# Patient Record
Sex: Female | Born: 1945 | Race: White | Hispanic: No | Marital: Married | Smoking: Never smoker
Health system: Southern US, Community
[De-identification: ages and names within clinical notes are randomized; demographics above are authoritative.]

## PROBLEM LIST (undated history)

## (undated) DIAGNOSIS — H269 Unspecified cataract: Secondary | ICD-10-CM

## (undated) DIAGNOSIS — Z8719 Personal history of other diseases of the digestive system: Secondary | ICD-10-CM

## (undated) DIAGNOSIS — I10 Essential (primary) hypertension: Secondary | ICD-10-CM

## (undated) DIAGNOSIS — D649 Anemia, unspecified: Secondary | ICD-10-CM

## (undated) DIAGNOSIS — M1990 Unspecified osteoarthritis, unspecified site: Secondary | ICD-10-CM

## (undated) DIAGNOSIS — Z87442 Personal history of urinary calculi: Secondary | ICD-10-CM

## (undated) DIAGNOSIS — G43909 Migraine, unspecified, not intractable, without status migrainosus: Secondary | ICD-10-CM

## (undated) DIAGNOSIS — Z5189 Encounter for other specified aftercare: Secondary | ICD-10-CM

## (undated) DIAGNOSIS — G4700 Insomnia, unspecified: Secondary | ICD-10-CM

## (undated) DIAGNOSIS — L821 Other seborrheic keratosis: Secondary | ICD-10-CM

## (undated) DIAGNOSIS — F419 Anxiety disorder, unspecified: Secondary | ICD-10-CM

## (undated) DIAGNOSIS — E785 Hyperlipidemia, unspecified: Secondary | ICD-10-CM

## (undated) DIAGNOSIS — R9431 Abnormal electrocardiogram [ECG] [EKG]: Secondary | ICD-10-CM

## (undated) DIAGNOSIS — Z923 Personal history of irradiation: Secondary | ICD-10-CM

## (undated) DIAGNOSIS — F32A Depression, unspecified: Secondary | ICD-10-CM

## (undated) DIAGNOSIS — F329 Major depressive disorder, single episode, unspecified: Secondary | ICD-10-CM

## (undated) DIAGNOSIS — R1013 Epigastric pain: Secondary | ICD-10-CM

## (undated) DIAGNOSIS — J309 Allergic rhinitis, unspecified: Secondary | ICD-10-CM

## (undated) DIAGNOSIS — E039 Hypothyroidism, unspecified: Secondary | ICD-10-CM

## (undated) DIAGNOSIS — K219 Gastro-esophageal reflux disease without esophagitis: Secondary | ICD-10-CM

## (undated) DIAGNOSIS — T7840XA Allergy, unspecified, initial encounter: Secondary | ICD-10-CM

## (undated) HISTORY — DX: Anemia, unspecified: D64.9

## (undated) HISTORY — DX: Allergic rhinitis, unspecified: J30.9

## (undated) HISTORY — DX: Migraine, unspecified, not intractable, without status migrainosus: G43.909

## (undated) HISTORY — DX: Anxiety disorder, unspecified: F41.9

## (undated) HISTORY — PX: JOINT REPLACEMENT: SHX530

## (undated) HISTORY — DX: Insomnia, unspecified: G47.00

## (undated) HISTORY — PX: OTHER SURGICAL HISTORY: SHX169

## (undated) HISTORY — DX: Unspecified cataract: H26.9

## (undated) HISTORY — DX: Allergy, unspecified, initial encounter: T78.40XA

## (undated) HISTORY — DX: Essential (primary) hypertension: I10

## (undated) HISTORY — DX: Unspecified osteoarthritis, unspecified site: M19.90

## (undated) HISTORY — DX: Major depressive disorder, single episode, unspecified: F32.9

## (undated) HISTORY — DX: Other seborrheic keratosis: L82.1

## (undated) HISTORY — DX: Hyperlipidemia, unspecified: E78.5

## (undated) HISTORY — DX: Epigastric pain: R10.13

## (undated) HISTORY — DX: Personal history of other diseases of the digestive system: Z87.19

## (undated) HISTORY — PX: EYE SURGERY: SHX253

## (undated) HISTORY — DX: Encounter for other specified aftercare: Z51.89

## (undated) HISTORY — DX: Depression, unspecified: F32.A

## (undated) HISTORY — DX: Personal history of irradiation: Z92.3

## (undated) HISTORY — DX: Abnormal electrocardiogram (ECG) (EKG): R94.31

---

## 1984-09-04 HISTORY — PX: ABDOMINAL HYSTERECTOMY: SHX81

## 1998-12-23 ENCOUNTER — Other Ambulatory Visit: Admission: RE | Admit: 1998-12-23 | Discharge: 1998-12-23 | Payer: Self-pay | Admitting: Obstetrics & Gynecology

## 2000-01-23 ENCOUNTER — Other Ambulatory Visit: Admission: RE | Admit: 2000-01-23 | Discharge: 2000-01-23 | Payer: Self-pay | Admitting: Obstetrics & Gynecology

## 2001-02-19 ENCOUNTER — Other Ambulatory Visit: Admission: RE | Admit: 2001-02-19 | Discharge: 2001-02-19 | Payer: Self-pay | Admitting: Obstetrics & Gynecology

## 2002-03-10 ENCOUNTER — Other Ambulatory Visit: Admission: RE | Admit: 2002-03-10 | Discharge: 2002-03-10 | Payer: Self-pay | Admitting: Obstetrics & Gynecology

## 2006-02-23 ENCOUNTER — Encounter: Payer: Self-pay | Admitting: Vascular Surgery

## 2006-02-23 ENCOUNTER — Ambulatory Visit (HOSPITAL_COMMUNITY): Admission: RE | Admit: 2006-02-23 | Discharge: 2006-02-23 | Payer: Self-pay | Admitting: Internal Medicine

## 2007-01-31 ENCOUNTER — Encounter: Admission: RE | Admit: 2007-01-31 | Discharge: 2007-01-31 | Payer: Self-pay | Admitting: Internal Medicine

## 2008-03-25 ENCOUNTER — Encounter: Admission: RE | Admit: 2008-03-25 | Discharge: 2008-03-25 | Payer: Self-pay | Admitting: Internal Medicine

## 2009-06-01 ENCOUNTER — Ambulatory Visit (HOSPITAL_COMMUNITY): Admission: RE | Admit: 2009-06-01 | Discharge: 2009-06-01 | Payer: Self-pay | Admitting: Internal Medicine

## 2009-06-10 ENCOUNTER — Ambulatory Visit (HOSPITAL_COMMUNITY): Admission: RE | Admit: 2009-06-10 | Discharge: 2009-06-10 | Payer: Self-pay | Admitting: Internal Medicine

## 2009-12-15 ENCOUNTER — Ambulatory Visit (HOSPITAL_COMMUNITY): Admission: RE | Admit: 2009-12-15 | Discharge: 2009-12-15 | Payer: Self-pay | Admitting: Internal Medicine

## 2010-06-29 ENCOUNTER — Ambulatory Visit (HOSPITAL_COMMUNITY): Admission: RE | Admit: 2010-06-29 | Discharge: 2010-06-29 | Payer: Self-pay | Admitting: Internal Medicine

## 2010-09-02 ENCOUNTER — Encounter
Admission: RE | Admit: 2010-09-02 | Discharge: 2010-09-02 | Payer: Self-pay | Source: Home / Self Care | Attending: Internal Medicine | Admitting: Internal Medicine

## 2010-09-15 ENCOUNTER — Encounter (HOSPITAL_COMMUNITY)
Admission: RE | Admit: 2010-09-15 | Discharge: 2010-10-04 | Payer: Self-pay | Source: Home / Self Care | Attending: Internal Medicine | Admitting: Internal Medicine

## 2010-09-23 DIAGNOSIS — Z923 Personal history of irradiation: Secondary | ICD-10-CM

## 2010-09-23 HISTORY — DX: Personal history of irradiation: Z92.3

## 2011-06-01 ENCOUNTER — Other Ambulatory Visit (HOSPITAL_COMMUNITY): Payer: Self-pay | Admitting: Internal Medicine

## 2011-06-01 DIAGNOSIS — R920 Mammographic microcalcification found on diagnostic imaging of breast: Secondary | ICD-10-CM

## 2011-07-05 ENCOUNTER — Ambulatory Visit (HOSPITAL_COMMUNITY)
Admission: RE | Admit: 2011-07-05 | Discharge: 2011-07-05 | Disposition: A | Discharge status: Home / Self Care | Payer: 59 | Source: Ambulatory Visit | Attending: Internal Medicine | Admitting: Internal Medicine

## 2011-07-05 DIAGNOSIS — R928 Other abnormal and inconclusive findings on diagnostic imaging of breast: Secondary | ICD-10-CM | POA: Insufficient documentation

## 2011-07-05 DIAGNOSIS — Z09 Encounter for follow-up examination after completed treatment for conditions other than malignant neoplasm: Secondary | ICD-10-CM | POA: Insufficient documentation

## 2011-07-05 DIAGNOSIS — R920 Mammographic microcalcification found on diagnostic imaging of breast: Secondary | ICD-10-CM

## 2012-12-05 ENCOUNTER — Other Ambulatory Visit: Payer: Self-pay | Admitting: Internal Medicine

## 2012-12-05 DIAGNOSIS — R1011 Right upper quadrant pain: Secondary | ICD-10-CM

## 2012-12-06 ENCOUNTER — Ambulatory Visit
Admission: RE | Admit: 2012-12-06 | Discharge: 2012-12-06 | Disposition: A | Discharge status: Home / Self Care | Payer: Medicare Other | Source: Ambulatory Visit | Attending: Internal Medicine | Admitting: Internal Medicine

## 2012-12-06 ENCOUNTER — Other Ambulatory Visit: Payer: Self-pay | Admitting: Internal Medicine

## 2012-12-06 DIAGNOSIS — R1011 Right upper quadrant pain: Secondary | ICD-10-CM

## 2013-03-05 ENCOUNTER — Other Ambulatory Visit: Payer: Self-pay | Admitting: Gastroenterology

## 2013-03-05 DIAGNOSIS — R1310 Dysphagia, unspecified: Secondary | ICD-10-CM

## 2013-03-11 ENCOUNTER — Ambulatory Visit
Admission: RE | Admit: 2013-03-11 | Discharge: 2013-03-11 | Disposition: A | Discharge status: Home / Self Care | Payer: Medicare Other | Source: Ambulatory Visit | Attending: Gastroenterology | Admitting: Gastroenterology

## 2013-03-11 DIAGNOSIS — R1310 Dysphagia, unspecified: Secondary | ICD-10-CM

## 2013-06-02 ENCOUNTER — Other Ambulatory Visit (HOSPITAL_COMMUNITY): Payer: Self-pay | Admitting: Internal Medicine

## 2013-06-02 DIAGNOSIS — Z139 Encounter for screening, unspecified: Secondary | ICD-10-CM

## 2013-06-10 ENCOUNTER — Ambulatory Visit (HOSPITAL_COMMUNITY)
Admission: RE | Admit: 2013-06-10 | Discharge: 2013-06-10 | Disposition: A | Discharge status: Home / Self Care | Payer: Medicare Other | Source: Ambulatory Visit | Attending: Internal Medicine | Admitting: Internal Medicine

## 2013-06-10 DIAGNOSIS — Z1231 Encounter for screening mammogram for malignant neoplasm of breast: Secondary | ICD-10-CM | POA: Insufficient documentation

## 2013-06-10 DIAGNOSIS — Z139 Encounter for screening, unspecified: Secondary | ICD-10-CM

## 2014-05-07 ENCOUNTER — Other Ambulatory Visit (HOSPITAL_COMMUNITY): Payer: Self-pay | Admitting: Internal Medicine

## 2014-05-07 DIAGNOSIS — Z139 Encounter for screening, unspecified: Secondary | ICD-10-CM

## 2014-05-19 ENCOUNTER — Other Ambulatory Visit: Payer: Self-pay

## 2014-05-19 DIAGNOSIS — Z1231 Encounter for screening mammogram for malignant neoplasm of breast: Secondary | ICD-10-CM

## 2014-06-11 ENCOUNTER — Ambulatory Visit (HOSPITAL_COMMUNITY): Payer: Medicare Other

## 2014-06-22 ENCOUNTER — Ambulatory Visit
Admission: RE | Admit: 2014-06-22 | Discharge: 2014-06-22 | Disposition: A | Discharge status: Home / Self Care | Payer: Medicare Other | Source: Ambulatory Visit

## 2014-06-22 ENCOUNTER — Encounter (INDEPENDENT_AMBULATORY_CARE_PROVIDER_SITE_OTHER): Payer: Self-pay

## 2014-06-22 DIAGNOSIS — Z1231 Encounter for screening mammogram for malignant neoplasm of breast: Secondary | ICD-10-CM

## 2017-02-08 ENCOUNTER — Other Ambulatory Visit: Payer: Self-pay | Admitting: Internal Medicine

## 2017-02-08 DIAGNOSIS — Z1231 Encounter for screening mammogram for malignant neoplasm of breast: Secondary | ICD-10-CM

## 2017-02-22 ENCOUNTER — Ambulatory Visit
Admission: RE | Admit: 2017-02-22 | Discharge: 2017-02-22 | Disposition: A | Discharge status: Home / Self Care | Payer: Medicare Other | Source: Ambulatory Visit | Attending: Internal Medicine | Admitting: Internal Medicine

## 2017-02-22 DIAGNOSIS — Z1231 Encounter for screening mammogram for malignant neoplasm of breast: Secondary | ICD-10-CM

## 2017-06-21 ENCOUNTER — Other Ambulatory Visit (HOSPITAL_COMMUNITY): Payer: Self-pay | Admitting: Internal Medicine

## 2017-06-21 ENCOUNTER — Other Ambulatory Visit: Payer: Self-pay | Admitting: Internal Medicine

## 2017-06-21 DIAGNOSIS — E0590 Thyrotoxicosis, unspecified without thyrotoxic crisis or storm: Secondary | ICD-10-CM

## 2017-07-02 ENCOUNTER — Ambulatory Visit
Admission: RE | Admit: 2017-07-02 | Discharge: 2017-07-02 | Disposition: A | Discharge status: Home / Self Care | Payer: Medicare Other | Source: Ambulatory Visit | Attending: Internal Medicine | Admitting: Internal Medicine

## 2017-07-02 DIAGNOSIS — E0590 Thyrotoxicosis, unspecified without thyrotoxic crisis or storm: Secondary | ICD-10-CM

## 2017-07-10 ENCOUNTER — Encounter (HOSPITAL_COMMUNITY): Payer: Self-pay

## 2017-07-10 ENCOUNTER — Ambulatory Visit (HOSPITAL_COMMUNITY): Payer: Medicare Other

## 2017-07-11 ENCOUNTER — Other Ambulatory Visit (HOSPITAL_COMMUNITY): Payer: Medicare Other

## 2017-08-14 ENCOUNTER — Encounter (HOSPITAL_COMMUNITY)
Admission: RE | Admit: 2017-08-14 | Discharge: 2017-08-14 | Disposition: A | Discharge status: Home / Self Care | Payer: Medicare Other | Source: Ambulatory Visit | Attending: Internal Medicine | Admitting: Internal Medicine

## 2017-08-14 ENCOUNTER — Encounter (HOSPITAL_COMMUNITY): Payer: Self-pay

## 2017-08-14 DIAGNOSIS — E0590 Thyrotoxicosis, unspecified without thyrotoxic crisis or storm: Secondary | ICD-10-CM

## 2017-08-15 ENCOUNTER — Other Ambulatory Visit (HOSPITAL_COMMUNITY): Payer: Self-pay | Admitting: Internal Medicine

## 2017-08-15 ENCOUNTER — Encounter (HOSPITAL_COMMUNITY): Payer: Medicare Other

## 2017-08-15 DIAGNOSIS — E0590 Thyrotoxicosis, unspecified without thyrotoxic crisis or storm: Secondary | ICD-10-CM

## 2017-09-26 ENCOUNTER — Encounter (HOSPITAL_COMMUNITY)
Admission: RE | Admit: 2017-09-26 | Discharge: 2017-09-26 | Disposition: A | Discharge status: Home / Self Care | Payer: Medicare Other | Source: Ambulatory Visit | Attending: Internal Medicine | Admitting: Internal Medicine

## 2017-09-26 ENCOUNTER — Encounter (HOSPITAL_COMMUNITY): Payer: Self-pay

## 2017-09-26 DIAGNOSIS — E0590 Thyrotoxicosis, unspecified without thyrotoxic crisis or storm: Secondary | ICD-10-CM | POA: Insufficient documentation

## 2017-09-26 MED ORDER — SODIUM IODIDE I-123 7.4 MBQ CAPS
300.0000 | ORAL_CAPSULE | Freq: Once | ORAL | Status: AC
  Administered 2017-09-26: 298 via ORAL

## 2017-09-27 ENCOUNTER — Encounter (HOSPITAL_COMMUNITY)
Admission: RE | Admit: 2017-09-27 | Discharge: 2017-09-27 | Disposition: A | Discharge status: Home / Self Care | Payer: Medicare Other | Source: Ambulatory Visit | Attending: Internal Medicine | Admitting: Internal Medicine

## 2017-11-29 ENCOUNTER — Other Ambulatory Visit: Payer: Self-pay

## 2017-11-29 ENCOUNTER — Emergency Department (HOSPITAL_COMMUNITY): Payer: Medicare Other

## 2017-11-29 ENCOUNTER — Encounter (HOSPITAL_COMMUNITY): Payer: Self-pay

## 2017-11-29 ENCOUNTER — Emergency Department (HOSPITAL_COMMUNITY)
Admission: EM | Admit: 2017-11-29 | Discharge: 2017-11-29 | Disposition: A | Discharge status: Home / Self Care | Payer: Medicare Other | Attending: Emergency Medicine | Admitting: Emergency Medicine

## 2017-11-29 DIAGNOSIS — I10 Essential (primary) hypertension: Secondary | ICD-10-CM | POA: Insufficient documentation

## 2017-11-29 DIAGNOSIS — F329 Major depressive disorder, single episode, unspecified: Secondary | ICD-10-CM | POA: Insufficient documentation

## 2017-11-29 DIAGNOSIS — J9801 Acute bronchospasm: Secondary | ICD-10-CM | POA: Diagnosis not present

## 2017-11-29 DIAGNOSIS — Z79899 Other long term (current) drug therapy: Secondary | ICD-10-CM | POA: Diagnosis not present

## 2017-11-29 DIAGNOSIS — K219 Gastro-esophageal reflux disease without esophagitis: Secondary | ICD-10-CM | POA: Diagnosis not present

## 2017-11-29 DIAGNOSIS — R0989 Other specified symptoms and signs involving the circulatory and respiratory systems: Secondary | ICD-10-CM | POA: Diagnosis present

## 2017-11-29 LAB — CBC WITH DIFFERENTIAL/PLATELET
BASOS PCT: 1 %
Basophils Absolute: 0.1 10*3/uL (ref 0.0–0.1)
EOS ABS: 0.3 10*3/uL (ref 0.0–0.7)
EOS PCT: 3 %
HCT: 38 % (ref 36.0–46.0)
Hemoglobin: 12.1 g/dL (ref 12.0–15.0)
Lymphocytes Relative: 35 %
Lymphs Abs: 3.4 10*3/uL (ref 0.7–4.0)
MCH: 28.9 pg (ref 26.0–34.0)
MCHC: 31.8 g/dL (ref 30.0–36.0)
MCV: 90.7 fL (ref 78.0–100.0)
Monocytes Absolute: 0.6 10*3/uL (ref 0.1–1.0)
Monocytes Relative: 6 %
Neutro Abs: 5.6 10*3/uL (ref 1.7–7.7)
Neutrophils Relative %: 55 %
PLATELETS: 318 10*3/uL (ref 150–400)
RBC: 4.19 MIL/uL (ref 3.87–5.11)
RDW: 15.5 % (ref 11.5–15.5)
WBC: 9.9 10*3/uL (ref 4.0–10.5)

## 2017-11-29 LAB — COMPREHENSIVE METABOLIC PANEL
ALK PHOS: 70 U/L (ref 38–126)
ALT: 19 U/L (ref 14–54)
AST: 23 U/L (ref 15–41)
Albumin: 3.9 g/dL (ref 3.5–5.0)
Anion gap: 12 (ref 5–15)
BILIRUBIN TOTAL: 0.5 mg/dL (ref 0.3–1.2)
BUN: 19 mg/dL (ref 6–20)
CALCIUM: 9.3 mg/dL (ref 8.9–10.3)
CO2: 23 mmol/L (ref 22–32)
CREATININE: 1.38 mg/dL — AB (ref 0.44–1.00)
Chloride: 105 mmol/L (ref 101–111)
GFR, EST AFRICAN AMERICAN: 43 mL/min — AB (ref >60)
GFR, EST NON AFRICAN AMERICAN: 37 mL/min — AB (ref >60)
Glucose, Bld: 118 mg/dL — ABNORMAL HIGH (ref 65–99)
Potassium: 3.6 mmol/L (ref 3.5–5.1)
Sodium: 140 mmol/L (ref 135–145)
Total Protein: 7.6 g/dL (ref 6.5–8.1)

## 2017-11-29 LAB — TROPONIN I

## 2017-11-29 MED ORDER — ALBUTEROL SULFATE (2.5 MG/3ML) 0.083% IN NEBU
5.0000 mg | INHALATION_SOLUTION | Freq: Once | RESPIRATORY_TRACT | Status: AC
  Administered 2017-11-29: 5 mg via RESPIRATORY_TRACT
  Filled 2017-11-29: qty 6

## 2017-11-29 MED ORDER — FAMOTIDINE 20 MG PO TABS
20.0000 mg | ORAL_TABLET | Freq: Once | ORAL | Status: AC
  Administered 2017-11-29: 20 mg via ORAL
  Filled 2017-11-29: qty 1

## 2017-11-29 MED ORDER — RACEPINEPHRINE HCL 2.25 % IN NEBU
0.3000 mL | INHALATION_SOLUTION | Freq: Once | RESPIRATORY_TRACT | Status: AC
  Administered 2017-11-29: 0.3 mL via RESPIRATORY_TRACT
  Filled 2017-11-29: qty 0.5

## 2017-11-29 MED ORDER — GI COCKTAIL ~~LOC~~
30.0000 mL | Freq: Once | ORAL | Status: AC
  Administered 2017-11-29: 30 mL via ORAL
  Filled 2017-11-29: qty 30

## 2017-11-29 MED ORDER — PANTOPRAZOLE SODIUM 20 MG PO TBEC: DELAYED_RELEASE_TABLET | ORAL | 0 refills | Status: DC

## 2017-11-29 MED ORDER — CALCIUM CARBONATE ANTACID 500 MG PO CHEW
2.0000 | CHEWABLE_TABLET | Freq: Once | ORAL | Status: AC
  Administered 2017-11-29: 400 mg via ORAL
  Filled 2017-11-29: qty 2

## 2017-11-29 NOTE — ED Triage Notes (Signed)
SOB 1 hour ago with sore throat.

## 2017-11-29 NOTE — ED Provider Notes (Signed)
[facility] Provider Note   CSN: [id number] Arrival date & time: 11/29/17  0212  Time seen 02:48 AM   History   Chief Complaint Chief Complaint  Patient presents with  . Shortness of Breath    HPI [facility] is a 72 y.o. female.  HPI patient states she has been having really bad acid reflux and sometimes at night she gets episodes where she feels like she is choking.  She states the last time was a few months ago until tonight.  About an hour and a half ago she was awakened from sleep and felt the need to cough and felt like she could not breathe.  She also was wheezing.  She complains of burning reflex fluid in her throat.  She has never discussed this with her doctor.  She does not have any inhalers or nebulizers at home.  She states she has been on Protonix for a long time.  She states tonight she did not pretzels, corn bread, and almond milk.  She states she has noticed that food she eats can make the symptoms worse.  She states she is noticed if she lays on her left side the symptoms will get worse or happen more often, and if she sits up she feels better.  However tonight sitting up has not helped.  Patient states she had a endoscopy done at [facility] about 3 years ago that was normal.  She also reports she had a thyroid ablation done about 5 years ago however most recently she has been hypothyroid.  She has recently got a scan of her thyroid.  She is followed by Dr.Kerr, endocrinology.   PCP Marden Noble, MD En[facility] Baas  Past Medical History:  Diagnosis Date  . Abnormal EKG   . Allergic rhinitis   . Depression   . DJD (degenerative joint disease)    cervical and Rt. knee, Rt. shoulder rotator cuff tendonitis  . Dyspepsia   . H/O esophageal reflux    w/hiatal hernia  . 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 Hypertension   . Insomnia    Occassional  . Migraine     . Seborrheic keratoses    Scattered seborrheic keratoses    There are no active problems to display for this patient.   History reviewed. No pertinent surgical history.   OB History   None      Home Medications    Prior to Admission medications   Medication Sig Start Date End Date Taking? Authorizing Provider  ALPRAZolam Prudy Feeler) 0.25 MG tablet Take 0.25 mg by mouth at bedtime as needed for anxiety.   Yes [provider]  hydrochlorothiazide (MICROZIDE) 12.5 MG capsule Take 12.5 mg by mouth daily.   Yes [provider]  levothyroxine (SYNTHROID, LEVOTHROID) 50 MCG tablet Take 50 mcg by mouth daily before breakfast.   Yes [provider]  losartan (COZAAR) 25 MG tablet Take 50 mg by mouth daily.   Yes [provider]  rosuvastatin (CRESTOR) 10 MG tablet Take 10 mg by mouth daily.   Yes [provider]  pantoprazole (PROTONIX) 20 MG tablet Take 1 po BID x 2 weeks then once a day 11/29/17   Devoria Albe, MD    Family History Family History  Problem Relation Age of Onset  . Breast cancer Neg Hx     Social History Social History   Tobacco Use  . Smoking status:  Never Smoker  . Smokeless tobacco: Never Used  Substance Use Topics  . Alcohol use: Not on file  . Drug use: Not on file  lives at home Lives with spouse   Allergies   Effexor [venlafaxine]; Penicillins; and Erythromycin   Review of Systems Review of Systems  All other systems reviewed and are negative.    Physical Exam Updated Vital Signs BP (!) 121/51   Pulse 85   Temp 97.8 F (36.6 C) (Oral)   Resp 14   Ht 5\' 3"  (1.6 m)   Wt 77.1 kg (170 lb)   SpO2 98%   BMI 30.11 kg/m   Physical Exam  Constitutional: She is oriented to person, place, and time. She appears well-developed and well-nourished.  Non-toxic appearance. She does not appear ill. No distress.  HENT:  Head: Normocephalic and atraumatic.  Right Ear: External ear normal.  Left Ear: External  ear normal.  Nose: Nose normal. No mucosal edema or rhinorrhea.  Mouth/Throat: Oropharynx is clear and moist and mucous membranes are normal. No dental abscesses or uvula swelling.  Eyes: Pupils are equal, round, and reactive to light. Conjunctivae and EOM are normal.  Neck: Normal range of motion and full passive range of motion without pain. Neck supple.  Cardiovascular: Normal rate, regular rhythm and normal heart sounds. Exam reveals no gallop and no friction rub.  No murmur heard. Pulmonary/Chest: Effort normal. No respiratory distress. She has decreased breath sounds. She has wheezes in the right middle field, the right lower field, the left middle field and the left lower field. She has no rhonchi. She has no rales. She exhibits no tenderness and no crepitus.  Abdominal: Soft. Normal appearance and bowel sounds are normal. She exhibits no distension. There is no tenderness. There is no rebound and no guarding.  Musculoskeletal: Normal range of motion. She exhibits no edema or tenderness.  Moves all extremities well.   Neurological: She is alert and oriented to person, place, and time. She has normal strength. No cranial nerve deficit.  Skin: Skin is warm, dry and intact. No rash noted. No erythema. No pallor.  Psychiatric: She has a normal mood and affect. Her speech is normal and behavior is normal. Her mood appears not anxious.  Nursing note and vitals reviewed.    ED Treatments / Results  Labs (all labs ordered are listed, but only abnormal results are displayed) Results for orders placed or performed during the [facility] encounter of 11/29/17  Comprehensive metabolic panel  Result Value Ref Range   Sodium 140 135 - 145 mmol/L   Potassium 3.6 3.5 - 5.1 mmol/L   Chloride 105 101 - 111 mmol/L   CO2 23 22 - 32 mmol/L   Glucose, Bld 118 (H) 65 - 99 mg/dL   BUN 19 6 - 20 mg/dL   Creatinine, Ser 0.981.38 (H) 0.44 - 1.00 mg/dL   Calcium 9.3 8.9 - 11.910.3 mg/dL   Total Protein 7.6 6.5 - 8.1  g/dL   Albumin 3.9 3.5 - 5.0 g/dL   AST 23 15 - 41 U/L   ALT 19 14 - 54 U/L   Alkaline Phosphatase 70 38 - 126 U/L   Total Bilirubin 0.5 0.3 - 1.2 mg/dL   GFR calc non Af Amer 37 (L) >60 mL/min   GFR calc Af Amer 43 (L) >60 mL/min   Anion gap 12 5 - 15  CBC with Differential  Result Value Ref Range   WBC 9.9 4.0 - 10.5 K/uL  RBC 4.19 3.87 - 5.11 MIL/uL   Hemoglobin 12.1 12.0 - 15.0 g/dL   HCT 16.1 09.6 - 04.5 %   MCV 90.7 78.0 - 100.0 fL   MCH 28.9 26.0 - 34.0 pg   MCHC 31.8 30.0 - 36.0 g/dL   RDW 40.9 81.1 - 91.4 %   Platelets 318 150 - 400 K/uL   Neutrophils Relative % 55 %   Neutro Abs 5.6 1.7 - 7.7 K/uL   Lymphocytes Relative 35 %   Lymphs Abs 3.4 0.7 - 4.0 K/uL   Monocytes Relative 6 %   Monocytes Absolute 0.6 0.1 - 1.0 K/uL   Eosinophils Relative 3 %   Eosinophils Absolute 0.3 0.0 - 0.7 K/uL   Basophils Relative 1 %   Basophils Absolute 0.1 0.0 - 0.1 K/uL  Troponin I  Result Value Ref Range   Troponin I <0.03 <0.03 ng/mL    Laboratory interpretation all normal except renal insuffiency  EKG EKG Interpretation  Date/Time:  Thursday November 29 2017 02:45:12 EDT Ventricular Rate:  74 PR Interval:    QRS Duration: 103 QT Interval:  378 QTC Calculation: 420 R Axis:   51 Text Interpretation:  Sinus rhythm Ventricular premature complex Low voltage, extremity and precordial leads Baseline wander No old tracing to compare Confirmed by Devoria Albe ([id number]) on 11/29/2017 3:21:24 AM   Radiology Dg Chest 2 View  Result Date: 11/29/2017 CLINICAL DATA:  72 year old female with shortness of breath. EXAM: CHEST - 2 VIEW COMPARISON:  Chest radiograph dated 08/25/2010 FINDINGS: The heart size and mediastinal contours are within normal limits. Both lungs are clear. The visualized skeletal structures are unremarkable. IMPRESSION: No active cardiopulmonary disease. Electronically Signed   By: Elgie Collard M.D.   On: 11/29/2017 02:48    Procedures Procedures (including critical  care time)  Medications Ordered in ED Medications  albuterol (PROVENTIL) (2.5 MG/3ML) 0.083% nebulizer solution 5 mg (5 mg Nebulization Given 11/29/17 0314)  gi cocktail (Maalox,Lidocaine,Donnatal) (30 mLs Oral Given 11/29/17 0311)  famotidine (PEPCID) tablet 20 mg (20 mg Oral Given 11/29/17 0311)  Racepinephrine HCl 2.25 % nebulizer solution 0.3 mL (0.3 mLs Nebulization Given 11/29/17 0519)  calcium carbonate (TUMS - dosed in mg elemental calcium) chewable tablet 400 mg of elemental calcium (400 mg of elemental calcium Oral Given 11/29/17 0547)     Initial Impression / Assessment and Plan / ED Course  I have reviewed the triage vital signs and the nursing notes.  Pertinent labs & imaging results that were available during my care of the patient were reviewed by me and considered in my medical decision making (see chart for details).     Patient was given GI cocktail and Pepcid for her complaints of acid reflux.  She was given an albuterol nebulizer for her wheezing.  Recheck at 4:30 AM she states she now only has the burning or acid feeling when she coughs.  She still feels short of breath.  She was ambulating back to her room from the bathroom and she did appear to get short of breath.  When I listen to her she has improved air movement but she still has diffuse rhonchi in her lower lungs.  I am going to try a racemic epinephrine treatment in case she is having some laryngospasm from the acid reflux.  Recheck at 6:50 AM patient is smiling and states she feels much improved.  Her lungs are now clear.  We discussed her medication, Protonix.  She states she has not been taking  it on a daily basis.  We discussed we could change it to something else or have her take the Protonix twice a day for 2 weeks and then once a day.  Which is what she would prefer to do.  She does not want to change it because she states it works if she takes it.  I will give her information on how to manage gastric reflux  disease and give her the number for [facility] if she is not improving.  Final Clinical Impressions(s) / ED Diagnoses   Final diagnoses:  Gastroesophageal reflux disease without esophagitis  Bronchospasm    ED Discharge Orders        Ordered    pantoprazole (PROTONIX) 20 MG tablet     11/29/17 1610     Plan discharge  Devoria Albe, MD, Concha Pyo, MD 11/29/17 [phone]

## 2017-11-29 NOTE — Discharge Instructions (Signed)
Increase her Protonix to twice a day for the next 2 weeks then once a day.  Look at the information about GERD.  If you are not improving please call [facility] to be reevaluated.  Please return to emergency department if you get short of breath and wheezing again.

## 2018-05-04 LAB — COLOGUARD: Cologuard: NEGATIVE

## 2018-05-27 ENCOUNTER — Encounter: Payer: Self-pay | Admitting: Family Medicine

## 2018-05-27 ENCOUNTER — Ambulatory Visit: Payer: Medicare Other | Admitting: Family Medicine

## 2018-05-27 VITALS — BP 170/80 | HR 73 | Temp 97.0°F | Ht 63.0 in | Wt 179.8 lb

## 2018-05-27 DIAGNOSIS — K219 Gastro-esophageal reflux disease without esophagitis: Secondary | ICD-10-CM | POA: Insufficient documentation

## 2018-05-27 DIAGNOSIS — Z1159 Encounter for screening for other viral diseases: Secondary | ICD-10-CM | POA: Diagnosis not present

## 2018-05-27 DIAGNOSIS — E785 Hyperlipidemia, unspecified: Secondary | ICD-10-CM | POA: Insufficient documentation

## 2018-05-27 DIAGNOSIS — I10 Essential (primary) hypertension: Secondary | ICD-10-CM | POA: Insufficient documentation

## 2018-05-27 DIAGNOSIS — E782 Mixed hyperlipidemia: Secondary | ICD-10-CM

## 2018-05-27 DIAGNOSIS — E039 Hypothyroidism, unspecified: Secondary | ICD-10-CM | POA: Insufficient documentation

## 2018-05-27 NOTE — Progress Notes (Signed)
BP (!) 170/80   Pulse 73   Temp (!) 97 F (36.1 C) (Oral)   Ht 5' 3"  (1.6 m)   Wt 179 lb 12.8 oz (81.6 kg)   BMI 31.85 kg/m    Subjective:    Patient ID: [facility], female    DOB: 1946/03/20, 72 y.o.   MRN: [id number]  HPI: [facility] is a 72 y.o. female presenting on 05/27/2018 for New Patient (Initial Visit) [facility] - Patient wants her Thyroid checked - patient states that she had two small nodules in 2018) and Establish Care   HPI Hypertension Patient is currently on hydrochlorothiazide and losartan, and their blood pressure today is 170/80 but she had been out of her medications, we will restart them. Patient denies any lightheadedness or dizziness. Patient denies headaches, blurred vision, chest pains, shortness of breath, or weakness. Denies any side effects from medication and is content with current medication.   Hypothyroidism recheck Patient is coming in for thyroid recheck today as well. They deny any issues with hair changes or heat or cold problems or diarrhea or constipation. They deny any chest pain or palpitations. They are currently on levothyroxine 88 micrograms, patient says she had a scan done recently that showed a couple of nodules, we are requesting those records and once we get them we will determine what needs to be done.  GERD Patient is currently on pantoprazole.  She denies any major symptoms or abdominal pain or belching or burping. She denies any blood in her stool or lightheadedness or dizziness.   Hyperlipidemia Patient is coming in for recheck of his hyperlipidemia. The patient is currently taking Crestor. They deny any issues with myalgias or history of liver damage from it. They deny any focal numbness or weakness or chest pain.   Relevant past medical, surgical, family and social history reviewed and updated as indicated. Interim medical history since our last visit reviewed. Allergies and medications reviewed and updated.  Review of  Systems  Constitutional: Negative for chills and fever.  HENT: Negative for congestion, ear discharge and ear pain.   Eyes: Negative for redness and visual disturbance.  Respiratory: Negative for chest tightness and shortness of breath.   Cardiovascular: Negative for chest pain and leg swelling.  Genitourinary: Negative for difficulty urinating and dysuria.  Musculoskeletal: Negative for back pain and gait problem.  Skin: Negative for rash.  Neurological: Negative for dizziness, weakness, light-headedness, numbness and headaches.  Psychiatric/Behavioral: Negative for agitation and behavioral problems.  All other systems reviewed and are negative.   Per HPI unless specifically indicated above  Social History   Socioeconomic History  . Marital status: Married    Spouse name: Not on file  . Number of children: Not on file  . Years of education: Not on file  . Highest education level: Not on file  Occupational History  . Not on file  Social Needs  . Financial resource strain: Not on file  . Food insecurity:    Worry: Not on file    Inability: Not on file  . Transportation needs:    Medical: Not on file    Non-medical: Not on file  Tobacco Use  . Smoking status: Never Smoker  . Smokeless tobacco: Never Used  Substance and Sexual Activity  . Alcohol use: Never    Frequency: Never  . Drug use: Never  . Sexual activity: Not on file  Lifestyle  . Physical activity:    Days per week: Not on  file    Minutes per session: Not on file  . Stress: Not on file  Relationships  . Social connections:    Talks on phone: Not on file    Gets together: Not on file    Attends religious service: Not on file    Active member of club or organization: Not on file    Attends meetings of clubs or organizations: Not on file    Relationship status: Not on file  . Intimate partner violence:    Fear of current or ex partner: Not on file    Emotionally abused: Not on file    Physically abused:  Not on file    Forced sexual activity: Not on file  Other Topics Concern  . Not on file  Social History Narrative  . Not on file    Past Surgical History:  Procedure Laterality Date  . ABDOMINAL HYSTERECTOMY  1986    Family History  Problem Relation Age of Onset  . Cancer Father        lung  . Arthritis Sister   . Breast cancer Neg Hx     Allergies as of 05/27/2018      Reactions   Effexor [venlafaxine]    Dizziness   Penicillins    Erythromycin Rash      Medication List        Accurate as of 05/27/18 10:11 AM. Always use your most recent med list.          ALPRAZolam 0.25 MG tablet Commonly known as:  XANAX Take 0.25 mg by mouth at bedtime as needed for anxiety.   hydrochlorothiazide 12.5 MG capsule Commonly known as:  MICROZIDE Take 12.5 mg by mouth daily.   levothyroxine 50 MCG tablet Commonly known as:  SYNTHROID, LEVOTHROID Take 88 mcg by mouth daily before breakfast.   losartan 25 MG tablet Commonly known as:  COZAAR Take 50 mg by mouth daily.   pantoprazole 20 MG tablet Commonly known as:  PROTONIX Take 1 po BID x 2 weeks then once a day   rosuvastatin 10 MG tablet Commonly known as:  CRESTOR Take 10 mg by mouth daily.          Objective:    BP (!) 170/80   Pulse 73   Temp (!) 97 F (36.1 C) (Oral)   Ht 5' 3"  (1.6 m)   Wt 179 lb 12.8 oz (81.6 kg)   BMI 31.85 kg/m   Wt Readings from Last 3 Encounters:  05/27/18 179 lb 12.8 oz (81.6 kg)  11/29/17 170 lb (77.1 kg)    Physical Exam  Constitutional: She is oriented to person, place, and time. She appears well-developed and well-nourished. No distress.  Eyes: Pupils are equal, round, and reactive to light. Conjunctivae and EOM are normal.  Neck: Neck supple. No thyromegaly present.  Cardiovascular: Normal rate, regular rhythm, normal heart sounds and intact distal pulses.  No murmur heard. Pulmonary/Chest: Effort normal and breath sounds normal. No respiratory distress. She has no  wheezes.  Musculoskeletal: Normal range of motion. She exhibits no edema or tenderness.  Lymphadenopathy:    She has no cervical adenopathy.  Neurological: She is alert and oriented to person, place, and time. Coordination normal.  Skin: Skin is warm and dry. No rash noted. She is not diaphoretic.  Psychiatric: She has a normal mood and affect. Her behavior is normal.  Nursing note and vitals reviewed.       Assessment & Plan:   Problem List  Items Addressed This Visit      Cardiovascular and Mediastinum   Hypertension - Primary   Relevant Orders   CMP14+EGFR (Completed)     Digestive   GERD (gastroesophageal reflux disease)   Relevant Orders   CBC with Differential/Platelet (Completed)     Endocrine   Hypothyroidism   Relevant Orders   TSH (Completed)     Other   Hyperlipidemia    Other Visit Diagnoses    Need for hepatitis C screening test       Relevant Orders   Hepatitis C antibody (Completed)      Continue medications from previous, will check labs and then follow-up on thyroid nodule in the future.  Thyroid nodules not palpable Follow up plan: Return if symptoms worsen or fail to improve, for 2 weeks.  Caryl Pina, MD [facility] 05/27/2018, 10:11 AM

## 2018-05-28 LAB — CMP14+EGFR
A/G RATIO: 1.8 (ref 1.2–2.2)
ALK PHOS: 86 IU/L (ref 39–117)
ALT: 10 IU/L (ref 0–32)
AST: 14 IU/L (ref 0–40)
Albumin: 4.5 g/dL (ref 3.5–4.8)
BUN/Creatinine Ratio: 15 (ref 12–28)
BUN: 19 mg/dL (ref 8–27)
Bilirubin Total: 0.3 mg/dL (ref 0.0–1.2)
CHLORIDE: 105 mmol/L (ref 96–106)
CO2: 23 mmol/L (ref 20–29)
Calcium: 9.8 mg/dL (ref 8.7–10.3)
Creatinine, Ser: 1.23 mg/dL — ABNORMAL HIGH (ref 0.57–1.00)
GFR calc non Af Amer: 44 mL/min/{1.73_m2} — ABNORMAL LOW (ref >59)
GFR, EST AFRICAN AMERICAN: 51 mL/min/{1.73_m2} — AB (ref >59)
Globulin, Total: 2.5 g/dL (ref 1.5–4.5)
Glucose: 94 mg/dL (ref 65–99)
Potassium: 4.4 mmol/L (ref 3.5–5.2)
Sodium: 143 mmol/L (ref 134–144)
Total Protein: 7 g/dL (ref 6.0–8.5)

## 2018-05-28 LAB — CBC WITH DIFFERENTIAL/PLATELET
BASOS: 0 %
Basophils Absolute: 0 10*3/uL (ref 0.0–0.2)
EOS (ABSOLUTE): 0.2 10*3/uL (ref 0.0–0.4)
Eos: 2 %
Hematocrit: 39.4 % (ref 34.0–46.6)
Hemoglobin: 12.9 g/dL (ref 11.1–15.9)
Immature Grans (Abs): 0 10*3/uL (ref 0.0–0.1)
Immature Granulocytes: 0 %
Lymphocytes Absolute: 2.8 10*3/uL (ref 0.7–3.1)
Lymphs: 30 %
MCH: 27.4 pg (ref 26.6–33.0)
MCHC: 32.7 g/dL (ref 31.5–35.7)
MCV: 84 fL (ref 79–97)
MONOS ABS: 0.6 10*3/uL (ref 0.1–0.9)
Monocytes: 7 %
NEUTROS ABS: 5.8 10*3/uL (ref 1.4–7.0)
Neutrophils: 61 %
PLATELETS: 327 10*3/uL (ref 150–450)
RBC: 4.7 x10E6/uL (ref 3.77–5.28)
RDW: 14.9 % (ref 12.3–15.4)
WBC: 9.5 10*3/uL (ref 3.4–10.8)

## 2018-05-28 LAB — TSH: TSH: 0.937 u[IU]/mL (ref 0.450–4.500)

## 2018-05-28 LAB — HEPATITIS C ANTIBODY: Hep C Virus Ab: <0.1 s/co ratio (ref 0.0–0.9)

## 2018-05-29 ENCOUNTER — Telehealth: Payer: Self-pay | Admitting: Family Medicine

## 2018-05-29 NOTE — Telephone Encounter (Signed)
Patient aware of labs.  

## 2018-06-13 ENCOUNTER — Ambulatory Visit: Payer: Medicare Other | Admitting: Family Medicine

## 2018-06-26 ENCOUNTER — Encounter: Payer: Self-pay | Admitting: Family Medicine

## 2018-06-26 ENCOUNTER — Ambulatory Visit: Payer: Medicare Other | Admitting: Family Medicine

## 2018-06-26 ENCOUNTER — Encounter (INDEPENDENT_AMBULATORY_CARE_PROVIDER_SITE_OTHER): Payer: Self-pay

## 2018-06-26 VITALS — BP 135/81 | HR 85 | Temp 97.2°F | Ht 63.0 in | Wt 179.2 lb

## 2018-06-26 DIAGNOSIS — Z23 Encounter for immunization: Secondary | ICD-10-CM

## 2018-06-26 DIAGNOSIS — I10 Essential (primary) hypertension: Secondary | ICD-10-CM | POA: Diagnosis not present

## 2018-06-26 DIAGNOSIS — B351 Tinea unguium: Secondary | ICD-10-CM

## 2018-06-26 DIAGNOSIS — M1712 Unilateral primary osteoarthritis, left knee: Secondary | ICD-10-CM

## 2018-06-26 DIAGNOSIS — F411 Generalized anxiety disorder: Secondary | ICD-10-CM | POA: Diagnosis not present

## 2018-06-26 DIAGNOSIS — E039 Hypothyroidism, unspecified: Secondary | ICD-10-CM

## 2018-06-26 MED ORDER — LEVOTHYROXINE SODIUM 88 MCG PO TABS: 88.0000 ug | ORAL_TABLET | Freq: Every day | ORAL | 3 refills | Status: DC

## 2018-06-26 MED ORDER — ALPRAZOLAM 0.25 MG PO TABS: 0.2500 mg | ORAL_TABLET | Freq: Every evening | ORAL | 2 refills | Status: DC | PRN

## 2018-06-26 MED ORDER — TERBINAFINE HCL 250 MG PO TABS: 250.0000 mg | ORAL_TABLET | Freq: Every day | ORAL | 2 refills | Status: DC

## 2018-06-26 MED ORDER — METHYLPREDNISOLONE ACETATE 80 MG/ML IJ SUSP
80.0000 mg | Freq: Once | INTRAMUSCULAR | Status: AC
  Administered 2018-06-26: 80 mg via INTRAMUSCULAR

## 2018-06-26 MED ORDER — BUPROPION HCL ER (XL) 150 MG PO TB24: 150.0000 mg | ORAL_TABLET | Freq: Every day | ORAL | 2 refills | Status: DC

## 2018-06-26 NOTE — Progress Notes (Signed)
BP 135/81   Pulse 85   Temp (!) 97.2 F (36.2 C) (Oral)   Ht 5\' 3"  (1.6 m)   Wt 179 lb 3.2 oz (81.3 kg)   BMI 31.74 kg/m    Subjective:    Patient ID: Isabella Lindsey, female    DOB: 01-Sep-1946, 72 y.o.   MRN: [id number]  HPI: Isabella Lindsey is a 72 y.o. female presenting on 06/26/2018 for Establish Care (1 month follow up- patient was new patient at last visit. Have we got records for [facility]?)   HPI Hypertension Patient is currently on hydrochlorothiazide and losartan, and their blood pressure today is 135/81. Patient denies any lightheadedness or dizziness. Patient denies headaches, blurred vision, chest pains, shortness of breath, or weakness. Denies any side effects from medication and is content with current medication.   Hypothyroidism recheck Patient is coming in for thyroid recheck today as well. They deny any issues with hair changes or heat or cold problems or diarrhea or constipation. They deny any chest pain or palpitations. They are currently on levothyroxine 88 micrograms   Anxiety Patient is coming in to discuss anxiety today.  She says that she feels like things have been worse and that she comes to this time year remembering her daughter's death was quite a few years ago but it is around this time year.  She says she is been feeling somewhat depressed more anxious and does not feel like the alprazolam does anything for him would like to try something different.  She denies any suicidal ideations or thoughts of hurting herself.  She just feels down and sometimes a lot more anxious.  Depression screen [facility] 2/9 06/26/2018 05/27/2018  Decreased Interest 0 0  Down, Depressed, Hopeless 2 1  PHQ - 2 Score 2 1  Altered sleeping 2 -  Tired, decreased energy 1 -  Change in appetite 2 -  Feeling bad or failure about yourself  1 -  Trouble concentrating 1 -  Moving slowly or fidgety/restless 0 -  Suicidal thoughts 0 -  PHQ-9 Score 9 -    Left knee  Osteoarthritis Patient is coming in today for left knee osteoarthritis which is worse than her right knee today and she says she is been told previously and has been bone-on-bone and has had some injections in the distant past but not recently.  She wants to see about getting an injection today.  She denies any redness or warmth or swelling.  She says is been gradually worsening over the past many years but is really been bothering her again over the past couple months.  Thickened fingernails Patient would like to try treatment for her thickened fingernails that she is been fighting for quite a few years cannot remember.  She just always give some pain in over but they do get thickened and yellow and brittle.  Relevant past medical, surgical, family and social history reviewed and updated as indicated. Interim medical history since our last visit reviewed. Allergies and medications reviewed and updated.  Review of Systems  Constitutional: Negative for chills and fever.  Eyes: Negative for visual disturbance.  Respiratory: Negative for chest tightness and shortness of breath.   Cardiovascular: Negative for chest pain and leg swelling.  Musculoskeletal: Negative for back pain and gait problem.  Skin: Negative for rash.  Neurological: Negative for light-headedness and headaches.  Psychiatric/Behavioral: Positive for dysphoric mood. Negative for agitation, behavioral problems, self-injury, sleep disturbance and suicidal ideas. The patient is nervous/anxious.  All other systems reviewed and are negative.   Per HPI unless specifically indicated above   Allergies as of 06/26/2018      Reactions   Effexor [venlafaxine]    Dizziness   Penicillins    Erythromycin Rash      Medication List        Accurate as of 06/26/18  9:01 AM. Always use your most recent med list.          ALPRAZolam 0.25 MG tablet Commonly known as:  XANAX Take 1 tablet (0.25 mg total) by mouth at bedtime as  needed for anxiety.   buPROPion 150 MG 24 hr tablet Commonly known as:  WELLBUTRIN XL Take 1 tablet (150 mg total) by mouth daily.   hydrochlorothiazide 12.5 MG capsule Commonly known as:  MICROZIDE Take 12.5 mg by mouth daily.   levothyroxine 88 MCG tablet Commonly known as:  SYNTHROID, LEVOTHROID Take 1 tablet (88 mcg total) by mouth daily before breakfast.   losartan 25 MG tablet Commonly known as:  COZAAR Take 50 mg by mouth daily.   pantoprazole 20 MG tablet Commonly known as:  PROTONIX Take 1 po BID x 2 weeks then once a day   rosuvastatin 10 MG tablet Commonly known as:  CRESTOR Take 10 mg by mouth daily.          Objective:    BP 135/81   Pulse 85   Temp (!) 97.2 F (36.2 C) (Oral)   Ht 5\' 3"  (1.6 m)   Wt 179 lb 3.2 oz (81.3 kg)   BMI 31.74 kg/m   Wt Readings from Last 3 Encounters:  06/26/18 179 lb 3.2 oz (81.3 kg)  05/27/18 179 lb 12.8 oz (81.6 kg)  11/29/17 170 lb (77.1 kg)    Physical Exam  Constitutional: She is oriented to person, place, and time. She appears well-developed and well-nourished. No distress.  Eyes: Conjunctivae are normal.  Neck: Neck supple. No thyromegaly present.  Cardiovascular: Normal rate, regular rhythm, normal heart sounds and intact distal pulses.  No murmur heard. Pulmonary/Chest: Effort normal and breath sounds normal. No respiratory distress. She has no wheezes.  Musculoskeletal: Normal range of motion. She exhibits tenderness (Bilateral knee tenderness but none to palpation, more stiff and achy and she describes it, no instability noted on exam). She exhibits no edema.  Lymphadenopathy:    She has no cervical adenopathy.  Neurological: She is alert and oriented to person, place, and time. Coordination normal.  Skin: Skin is warm and dry. No rash noted. She is not diaphoretic.  Thickened and yellow fingernails, covered with nail polish but he can see it under the ends of her fingernail.  Psychiatric: She has a normal  mood and affect. Her behavior is normal.  Nursing note and vitals reviewed.   Knee injection: Consent form signed. Risk factors of bleeding and infection discussed with patient and patient is agreeable towards injection. Patient prepped with Betadine. Lateral approach towards injection used. Injected 80 mg of Depo-Medrol and 1 mL of 2% lidocaine. Patient tolerated procedure well and no side effects from noted. Minimal to no bleeding. Simple bandage applied after.     Assessment & Plan:   Problem List Items Addressed This Visit      Cardiovascular and Mediastinum   Hypertension - Primary     Endocrine   Hypothyroidism   Relevant Medications   levothyroxine (SYNTHROID, LEVOTHROID) 88 MCG tablet     Other   GAD (generalized anxiety disorder)   Relevant  Medications   ALPRAZolam (XANAX) 0.25 MG tablet   buPROPion (WELLBUTRIN XL) 150 MG 24 hr tablet    Other Visit Diagnoses    Primary osteoarthritis of left knee       Relevant Medications   methylPREDNISolone acetate (DEPO-MEDROL) injection 80 mg (Completed) (Start on 06/26/2018  9:15 AM)   Onychomycosis       Relevant Medications   terbinafine (LAMISIL) 250 MG tablet       Follow up plan: Return in about 3 months (around 09/26/2018), or if symptoms worsen or fail to improve, for Anxiety and thyroid recheck.  Counseling provided for all of the vaccine components No orders of the defined types were placed in this encounter.   Arville Care, MD [facility] 06/26/2018, 9:01 AM

## 2018-08-20 DIAGNOSIS — Z1231 Encounter for screening mammogram for malignant neoplasm of breast: Secondary | ICD-10-CM | POA: Diagnosis not present

## 2018-08-20 LAB — HM MAMMOGRAPHY

## 2018-09-26 ENCOUNTER — Encounter: Payer: Self-pay | Admitting: Family Medicine

## 2018-09-26 ENCOUNTER — Ambulatory Visit (INDEPENDENT_AMBULATORY_CARE_PROVIDER_SITE_OTHER): Payer: Medicare Other | Admitting: Family Medicine

## 2018-09-26 VITALS — BP 144/73 | HR 83 | Temp 97.1°F | Ht 63.0 in | Wt 168.6 lb

## 2018-09-26 DIAGNOSIS — E039 Hypothyroidism, unspecified: Secondary | ICD-10-CM | POA: Diagnosis not present

## 2018-09-26 DIAGNOSIS — E782 Mixed hyperlipidemia: Secondary | ICD-10-CM | POA: Diagnosis not present

## 2018-09-26 DIAGNOSIS — K219 Gastro-esophageal reflux disease without esophagitis: Secondary | ICD-10-CM

## 2018-09-26 DIAGNOSIS — B351 Tinea unguium: Secondary | ICD-10-CM

## 2018-09-26 DIAGNOSIS — I10 Essential (primary) hypertension: Secondary | ICD-10-CM | POA: Diagnosis not present

## 2018-09-26 DIAGNOSIS — F411 Generalized anxiety disorder: Secondary | ICD-10-CM

## 2018-09-26 MED ORDER — BUPROPION HCL ER (XL) 300 MG PO TB24: 300.0000 mg | ORAL_TABLET | Freq: Every day | ORAL | 3 refills | Status: DC

## 2018-09-26 MED ORDER — TERBINAFINE HCL 250 MG PO TABS: 250.0000 mg | ORAL_TABLET | Freq: Every day | ORAL | 3 refills | Status: DC

## 2018-09-26 MED ORDER — HYDROCHLOROTHIAZIDE 12.5 MG PO CAPS: 12.5000 mg | ORAL_CAPSULE | Freq: Every day | ORAL | 3 refills | Status: DC

## 2018-09-26 MED ORDER — LOSARTAN POTASSIUM 25 MG PO TABS: 50.0000 mg | ORAL_TABLET | Freq: Every day | ORAL | 3 refills | Status: DC

## 2018-09-26 NOTE — Progress Notes (Signed)
BP (!) 144/73   Pulse 83   Temp (!) 97.1 F (36.2 C) (Oral)   Ht 5' 3"  (1.6 m)   Wt 168 lb 9.6 oz (76.5 kg)   BMI 29.87 kg/m    Subjective:    Patient ID: Isabella Lindsey, female    DOB: 1946-05-14, 73 y.o.   MRN: [id number]  HPI: Isabella Lindsey is a 73 y.o. female presenting on 09/26/2018 for Hypertension (3 month follow up) and Hyperlipidemia   HPI Hypertension Patient is currently on hydrochlorothiazide and losartan, and their blood pressure today is 144/73. Patient denies any lightheadedness or dizziness. Patient denies headaches, blurred vision, chest pains, shortness of breath, or weakness. Denies any side effects from medication and is content with current medication.   Hypothyroidism recheck Patient is coming in for thyroid recheck today as well. They deny any issues with hair changes or heat or cold problems or diarrhea or constipation. They deny any chest pain or palpitations. They are currently on levothyroxine 88 micrograms   Hyperlipidemia Patient is coming in for recheck of his hyperlipidemia. The patient is currently taking Crestor. They deny any issues with myalgias or history of liver damage from it. They deny any focal numbness or weakness or chest pain.   GERD Patient is currently on Protonix.  She denies any major symptoms or abdominal pain or belching or burping. She denies any blood in her stool or lightheadedness or dizziness.   Anxiety recheck Patient is coming in for anxiety recheck today.  She is currently on Wellbutrin and alprazolam and says that things are going well for her anxiety.  She denies any major depression or suicidal ideation.  She does feel like she could use a little bit more and is wondering about increasing the Wellbutrin.  She has not had any side effects from it. Depression screen [facility] 2/9 09/26/2018 06/26/2018 05/27/2018  Decreased Interest 2 0 0  Down, Depressed, Hopeless 2 2 1   PHQ - 2 Score 4 2 1   Altered sleeping 2 2 -  Tired,  decreased energy 2 1 -  Change in appetite 3 2 -  Feeling bad or failure about yourself  2 1 -  Trouble concentrating 1 1 -  Moving slowly or fidgety/restless 0 0 -  Suicidal thoughts 0 0 -  PHQ-9 Score 14 9 -     Onychomycosis recheck Patient comes in for onychomycosis recheck on her fingernails and says that it is starting to improve and the yellowing and thickness is starting to grow out is starting to look much better.  She still has pink under fingernails today so it is difficult to discern on exam but she says they are doing a lot better.  Relevant past medical, surgical, family and social history reviewed and updated as indicated. Interim medical history since our last visit reviewed. Allergies and medications reviewed and updated.  Review of Systems  Constitutional: Negative for chills and fever.  Eyes: Negative for redness and visual disturbance.  Respiratory: Negative for chest tightness and shortness of breath.   Cardiovascular: Negative for chest pain and leg swelling.  Musculoskeletal: Negative for back pain and gait problem.  Skin: Negative for rash.  Neurological: Negative for light-headedness and headaches.  Psychiatric/Behavioral: Positive for dysphoric mood. Negative for agitation, behavioral problems, self-injury, sleep disturbance and suicidal ideas. The patient is nervous/anxious.   All other systems reviewed and are negative.   Per HPI unless specifically indicated above   Allergies as of 09/26/2018  Reactions   Effexor [venlafaxine]    Dizziness   Penicillins    Erythromycin Rash      Medication List       Accurate as of September 26, 2018 11:44 AM. Always use your most recent med list.        ALPRAZolam 0.25 MG tablet Commonly known as:  XANAX Take 1 tablet (0.25 mg total) by mouth at bedtime as needed for anxiety.   buPROPion 300 MG 24 hr tablet Commonly known as:  WELLBUTRIN XL Take 1 tablet (300 mg total) by mouth daily.     hydrochlorothiazide 12.5 MG capsule Commonly known as:  MICROZIDE Take 1 capsule (12.5 mg total) by mouth daily.   levothyroxine 88 MCG tablet Commonly known as:  SYNTHROID, LEVOTHROID Take 1 tablet (88 mcg total) by mouth daily before breakfast.   losartan 25 MG tablet Commonly known as:  COZAAR Take 2 tablets (50 mg total) by mouth daily.   pantoprazole 20 MG tablet Commonly known as:  PROTONIX Take 1 po BID x 2 weeks then once a day   rosuvastatin 10 MG tablet Commonly known as:  CRESTOR Take 10 mg by mouth daily.   terbinafine 250 MG tablet Commonly known as:  LAMISIL Take 1 tablet (250 mg total) by mouth daily.          Objective:    BP (!) 144/73   Pulse 83   Temp (!) 97.1 F (36.2 C) (Oral)   Ht 5' 3"  (1.6 m)   Wt 168 lb 9.6 oz (76.5 kg)   BMI 29.87 kg/m   Wt Readings from Last 3 Encounters:  09/26/18 168 lb 9.6 oz (76.5 kg)  06/26/18 179 lb 3.2 oz (81.3 kg)  05/27/18 179 lb 12.8 oz (81.6 kg)    Physical Exam Vitals signs and nursing note reviewed.  Constitutional:      General: She is not in acute distress.    Appearance: She is well-developed. She is not diaphoretic.  Eyes:     Conjunctiva/sclera: Conjunctivae normal.  Cardiovascular:     Rate and Rhythm: Normal rate and regular rhythm.     Heart sounds: Normal heart sounds. No murmur.  Pulmonary:     Effort: Pulmonary effort is normal. No respiratory distress.     Breath sounds: Normal breath sounds. No wheezing.  Skin:    General: Skin is warm and dry.     Findings: No rash.  Neurological:     Mental Status: She is alert and oriented to person, place, and time.     Coordination: Coordination normal.  Psychiatric:        Mood and Affect: Mood is anxious. Mood is not depressed.        Behavior: Behavior normal.        Thought Content: Thought content does not include suicidal ideation. Thought content does not include suicidal plan.         Assessment & Plan:   Problem List Items  Addressed This Visit      Cardiovascular and Mediastinum   Hypertension   Relevant Medications   hydrochlorothiazide (MICROZIDE) 12.5 MG capsule   losartan (COZAAR) 25 MG tablet   Other Relevant Orders   CMP14+EGFR (Completed)     Digestive   GERD (gastroesophageal reflux disease)   Relevant Orders   CBC with Differential/Platelet (Completed)     Endocrine   Hypothyroidism - Primary   Relevant Orders   TSH (Completed)     Other   Hyperlipidemia  Relevant Medications   hydrochlorothiazide (MICROZIDE) 12.5 MG capsule   losartan (COZAAR) 25 MG tablet   Other Relevant Orders   Lipid panel (Completed)   GAD (generalized anxiety disorder)   Relevant Medications   buPROPion (WELLBUTRIN XL) 300 MG 24 hr tablet   Other Relevant Orders   CBC with Differential/Platelet (Completed)    Other Visit Diagnoses    Onychomycosis       Relevant Medications   terbinafine (LAMISIL) 250 MG tablet       Follow up plan: Return in about 6 months (around 03/27/2019), or if symptoms worsen or fail to improve, for Hypertension and thyroid.  Counseling provided for all of the vaccine components Orders Placed This Encounter  Procedures  . CBC with Differential/Platelet  . CMP14+EGFR  . Lipid panel  . TSH    Caryl Pina, MD [facility] 09/26/2018, 11:44 AM

## 2018-09-27 LAB — CBC WITH DIFFERENTIAL/PLATELET
Basophils Absolute: 0.1 10*3/uL (ref 0.0–0.2)
Basos: 1 %
EOS (ABSOLUTE): 0.1 10*3/uL (ref 0.0–0.4)
EOS: 2 %
HEMATOCRIT: 38.6 % (ref 34.0–46.6)
HEMOGLOBIN: 12.6 g/dL (ref 11.1–15.9)
IMMATURE GRANS (ABS): 0 10*3/uL (ref 0.0–0.1)
Immature Granulocytes: 0 %
LYMPHS: 25 %
Lymphocytes Absolute: 2.1 10*3/uL (ref 0.7–3.1)
MCH: 27.9 pg (ref 26.6–33.0)
MCHC: 32.6 g/dL (ref 31.5–35.7)
MCV: 85 fL (ref 79–97)
MONOCYTES: 8 %
Monocytes Absolute: 0.6 10*3/uL (ref 0.1–0.9)
NEUTROS ABS: 5.3 10*3/uL (ref 1.4–7.0)
Neutrophils: 64 %
Platelets: 287 10*3/uL (ref 150–450)
RBC: 4.52 x10E6/uL (ref 3.77–5.28)
RDW: 14.5 % (ref 11.7–15.4)
WBC: 8.2 10*3/uL (ref 3.4–10.8)

## 2018-09-27 LAB — CMP14+EGFR
ALBUMIN: 4.4 g/dL (ref 3.7–4.7)
ALT: 17 IU/L (ref 0–32)
AST: 16 IU/L (ref 0–40)
Albumin/Globulin Ratio: 1.9 (ref 1.2–2.2)
Alkaline Phosphatase: 83 IU/L (ref 39–117)
BUN / CREAT RATIO: 17 (ref 12–28)
BUN: 21 mg/dL (ref 8–27)
Bilirubin Total: 0.3 mg/dL (ref 0.0–1.2)
CHLORIDE: 103 mmol/L (ref 96–106)
CO2: 22 mmol/L (ref 20–29)
Calcium: 9.7 mg/dL (ref 8.7–10.3)
Creatinine, Ser: 1.21 mg/dL — ABNORMAL HIGH (ref 0.57–1.00)
GFR calc non Af Amer: 45 mL/min/{1.73_m2} — ABNORMAL LOW (ref >59)
GFR, EST AFRICAN AMERICAN: 52 mL/min/{1.73_m2} — AB (ref >59)
GLUCOSE: 92 mg/dL (ref 65–99)
Globulin, Total: 2.3 g/dL (ref 1.5–4.5)
Potassium: 4.4 mmol/L (ref 3.5–5.2)
Sodium: 143 mmol/L (ref 134–144)
TOTAL PROTEIN: 6.7 g/dL (ref 6.0–8.5)

## 2018-09-27 LAB — TSH: TSH: 0.56 u[IU]/mL (ref 0.450–4.500)

## 2018-09-27 LAB — LIPID PANEL
CHOL/HDL RATIO: 2.5 ratio (ref 0.0–4.4)
Cholesterol, Total: 156 mg/dL (ref 100–199)
HDL: 62 mg/dL (ref >39)
LDL Calculated: 66 mg/dL (ref 0–99)
Triglycerides: 140 mg/dL (ref 0–149)
VLDL CHOLESTEROL CAL: 28 mg/dL (ref 5–40)

## 2018-10-24 ENCOUNTER — Encounter: Payer: Medicare Other | Admitting: Family Medicine

## 2018-11-12 ENCOUNTER — Telehealth: Payer: Self-pay | Admitting: Family Medicine

## 2018-11-12 NOTE — Telephone Encounter (Signed)
Patient aware of recommendation.  

## 2018-11-12 NOTE — Telephone Encounter (Signed)
Will send to Jannifer Rodney, FNP to advise covering for Dr. Louanne Skye.

## 2018-11-12 NOTE — Telephone Encounter (Signed)
Dr. Louanne Skye is out today. I recommend good hygiene. After review of her chart she does not have chronic conditions such as  DM or COPD, but she is advanced age. I believe she should be fine, but should use precautions.

## 2018-11-27 ENCOUNTER — Ambulatory Visit: Payer: Medicare Other | Admitting: Family Medicine

## 2018-12-18 ENCOUNTER — Telehealth: Payer: Self-pay | Admitting: Family Medicine

## 2018-12-18 MED ORDER — PANTOPRAZOLE SODIUM 20 MG PO TBEC: DELAYED_RELEASE_TABLET | ORAL | 2 refills | Status: DC

## 2018-12-18 NOTE — Telephone Encounter (Signed)
Prescription sent to pharmacy.

## 2018-12-18 NOTE — Telephone Encounter (Signed)
What is the name of the medication? Pantoprazole 40 mg It's under another providers name. Has one pill left  Have you contacted your pharmacy to request a refill? NO  Which pharmacy would you like this sent to? [facility]   Patient notified that their request is being sent to the clinical staff for review and that they should receive a call once it is complete. If they do not receive a call within 24 hours they can check with their pharmacy or our office.

## 2019-01-07 ENCOUNTER — Other Ambulatory Visit: Payer: Self-pay | Admitting: Family Medicine

## 2019-01-07 DIAGNOSIS — F411 Generalized anxiety disorder: Secondary | ICD-10-CM

## 2019-01-07 NOTE — Telephone Encounter (Signed)
Pt aware that Dr Louanne Skye is off today and will address refill tomorrow.  Please check QTY - sending to mail order for 90 day  She would like a call once this is sent - please have nurse call her.  She was seen in JAN 2020 and has 6 mo follow up scheduled for July 2020.

## 2019-01-07 NOTE — Telephone Encounter (Signed)
What is the name of the medication? ALPRAZolam (XANAX) 0.25 MG tablet    Have you contacted your pharmacy to request a refill? yes  Which pharmacy would you like this sent to? [facility]   Patient notified that their request is being sent to the clinical staff for review and that they should receive a call once it is complete. If they do not receive a call within 24 hours they can check with their pharmacy or our office.

## 2019-01-08 MED ORDER — ALPRAZOLAM 0.25 MG PO TABS: 0.2500 mg | ORAL_TABLET | Freq: Every evening | ORAL | 0 refills | Status: DC | PRN

## 2019-01-10 ENCOUNTER — Other Ambulatory Visit: Payer: Self-pay | Admitting: Family Medicine

## 2019-01-10 DIAGNOSIS — F411 Generalized anxiety disorder: Secondary | ICD-10-CM

## 2019-01-20 ENCOUNTER — Ambulatory Visit (INDEPENDENT_AMBULATORY_CARE_PROVIDER_SITE_OTHER): Payer: Medicare Other | Admitting: *Deleted

## 2019-01-20 ENCOUNTER — Other Ambulatory Visit: Payer: Self-pay

## 2019-01-20 VITALS — Ht 63.0 in | Wt 145.0 lb

## 2019-01-20 DIAGNOSIS — Z0000 Encounter for general adult medical examination without abnormal findings: Secondary | ICD-10-CM | POA: Diagnosis not present

## 2019-01-20 NOTE — Patient Instructions (Signed)
Isabella Lindsey , Thank you for taking time to come for your Medicare Wellness Visit. I appreciate your ongoing commitment to your health goals. Please review the following plan we discussed and let me know if I can assist you in the future.   These are the goals we discussed: Goals    . Exercise 3x per week (30 min per time)       This is a list of the screening recommended for you and due dates:  Health Maintenance  Topic Date Due  . Tetanus Vaccine  08/24/1965  . DEXA scan (bone density measurement)  08/25/2011  . Pneumonia vaccines (2 of 2 - PPSV23) 08/09/2016  . Flu Shot  04/05/2019  . Mammogram  08/20/2020  . Cologuard (Stool DNA test)  05/04/2021  .  Hepatitis C: One time screening is recommended by Center for Disease Control  (CDC) for  adults born from [birth year] through 1965.   Completed     Exercise for Older Adults Staying physically active is important as you age. The four types of exercises that are best for older adults are endurance, strength, balance, and flexibility. Contact your health care provider before you start any exercise routine. Ask your health care provider what activities are safe for you. What are the risks? Risks associated with exercising include:  Overdoing it. This may lead to sore muscles or fatigue.  Falls.  Injuries.  Dehydration. How to do these exercises Endurance exercises Endurance (aerobic) exercises raise your breathing rate and heart rate. Increasing your endurance helps you to do everyday tasks and stay healthy. By improving the health of your body system that includes your heart, lungs, and blood vessels (circulatory system), you may also delay or prevent diseases such as heart disease, diabetes, and bone loss (osteoporosis). Types of endurance exercises include:  Sports.  Indoor activities, such as using gym equipment, doing water aerobics, or dancing.  Outdoor activities, such as biking or jogging.  Tasks around the house, such as  gardening, yard work, and heavy household chores like cleaning.  Walking, such as hiking or walking around your neighborhood. When doing endurance exercises, make sure you:  Are aware of your surroundings.  Use safety equipment as directed.  Dress in layers when exercising outdoors.  Drink plenty of water to stay well hydrated. Build up endurance slowly. Start with 10 minutes at a time, and gradually build up to doing 30 minutes at a time. Unless your health care provider gave you different instructions, aim to exercise for a total of 150 minutes a week. Spread out that time so you are working on endurance on 3 or more days a week. Strength exercises Lifting, pulling, or pushing weights helps to strengthen muscles. Having stronger muscles makes it easier to do everyday activities, such as getting up from a chair, climbing stairs, carrying groceries, and playing with grandchildren. Strength exercises include arm and leg exercises that may be done:  With weights.  Without weights (using your own body weight).  With a resistance band. When doing strength exercises:  Move smoothly and steadily. Do not suddenly thrust or jerk the weights, the resistance band, or your body.  Start with no weights or with light weights, and gradually add more weight over time. Eventually, aim to use weights that are hard or very hard for you to lift. This means that you are able to do 8 repetitions with the weight, and the last few repetitions are very challenging.  Lift or push weights into position  for 3 seconds, hold the position for 1 second, and then take 3 seconds to return to your starting position.  Breathe out (exhale) during difficult movements, like lifting or pushing weights. Breathe in (inhale) to relax your muscles before the next repetition.  Consider alternating arms or legs, especially when you first start strength exercises.  Expect some slight muscle soreness after each session. Do  strength exercises on 2 or more days a week, for 30 minutes at a time. Avoid exercising the same muscle groups two days in a row. For example, if you work on your leg muscles one day, work on your arm muscles the next day. When you can do two sets of 10-15 repetitions with a certain weight, increase the amount of weight. Balance Balance exercises can help to prevent falls. Balance exercises include:  Standing on one foot.  Heel-to-toe walk.  Balance walk.  Tai chi. Make sure you have something sturdy to hold onto while doing balance exercises, such as a sturdy chair. As your balance improves, challenge yourself by holding onto the chair with one hand instead of two, and then with no hands. Trying exercises with your eyes closed also challenges your balance, but be sure to have a sturdy surface (like a countertop) close by in case you need it. Do balance exercises as often as you want, or as often as directed by your health care provider. Strength exercises for the lower body also help to improve balance. Flexibility Flexibility exercises improve how far you can bend, straighten, move, or rotate parts of your body (range of motion). These exercises also help you to do everyday activities such as getting dressed or reaching for objects. Flexibility exercises include stretching different parts of the body, and they may be done in a standing or seated position or on the floor. When stretching, make sure you:  Keep a slight bend in your arms and legs. Avoid completely straightening ("locking") your joints.  Do not stretch so far that you feel pain. You should feel a mild stretching feeling. You may try stretching farther as you become more flexible over time.  Relax and breathe between stretches.  Hold onto something sturdy for balance as needed. Hold each stretch for 10-30 seconds. Repeat each stretch 3-5 times. General safety tips  Exercise in well-lit areas.  Do not hold your breath during  exercises or stretches.  Warm up before exercising, and cool down after exercising. This can help prevent injury.  Drink plenty of water during exercise or any activity that makes you sweat.  Use smooth, steady movements. Do not use sudden, jerking movements, especially when lifting weights or doing flexibility exercises.  If you are not sure if an exercise is safe for you, or you are not sure how to do an exercise, talk with your health care provider. This is especially important if you have had surgery on muscles, bones, or joints (orthopedic surgery). Where to find more information You can find more information about exercise for older adults from:  Your local health department, fitness center, or community center. These facilities may have programs for aging adults.  [employer] on Aging: http://kim-miller.com/  National Council on Aging: www.ncoa.org Summary  Staying physically active is important as you age.  Make sure to contact your health care provider before you start any exercise routine. Ask your health care provider what activities are safe for you.  Doing endurance, strength, balance, and flexibility exercises can help to delay or prevent certain diseases, such  as heart disease, diabetes, and bone loss (osteoporosis). This information is not intended to replace advice given to you by your health care provider. Make sure you discuss any questions you have with your health care provider. Document Released: 01/10/2017 Document Revised: 01/10/2017 Document Reviewed: 01/10/2017 Elsevier Interactive Patient Education  2019 [employer].

## 2019-01-20 NOTE — Progress Notes (Signed)
MEDICARE ANNUAL WELLNESS VISIT  01/20/2019  Telephone Visit Disclaimer This Medicare AWV was conducted by telephone due to national recommendations for restrictions regarding the COVID-19 Pandemic (e.g. social distancing).  I verified, using two identifiers, that I am speaking with Isabella RobertsonSandra L Blacksher or their authorized healthcare agent. I discussed the limitations, risks, security, and privacy concerns of performing an evaluation and management service by telephone and the potential availability of an in-person appointment in the future. The patient expressed understanding and agreed to proceed.   Subjective:  Isabella RobertsonSandra L Porrata is a 73 y.o. female patient of Dettinger, Elige RadonJoshua A, MD who had a Medicare Annual Wellness Visit today via telephone. Dois DavenportSandra is Retired and lives with their spouse. she has 2 living children and 1 deceased. she reports that she is socially active and does interact with friends/family regularly. she is minimally physically active and enjoys reading.  Patient Care Team: Dettinger, Elige RadonJoshua A, MD as PCP - General (Family Medicine)  Advanced Directives 01/20/2019  Does Patient Have a Medical Advance Directive? Yes  Type of Estate agentAdvance Directive Healthcare Power of [place];Living will  Does patient want to make changes to medical advance directive? No - Patient declined  Copy of Healthcare Power of Attorney in Chart? No - copy requested    Hospital Utilization Over the Past 12 Months: # of hospitalizations or ER visits: 0 # of surgeries: 0  Review of Systems    Patient reports that her overall health is better compared to last year.  Patient Reported Readings (BP, Pulse, CBG, Weight, etc) Weight 145lbs BP 130/69 P-75  Review of Systems: History obtained from chart review and the patient General ROS: negative  All other systems negative.  Pain Assessment Pain : No/denies pain     Current Medications & Allergies (verified) Allergies as of 01/20/2019    Reactions   Effexor [venlafaxine]    Dizziness   Penicillins    Erythromycin Rash      Medication List       Accurate as of Jan 20, 2019  9:07 AM. If you have any questions, ask your nurse or doctor.        ALPRAZolam 0.25 MG tablet Commonly known as:  XANAX Take 1 tablet (0.25 mg total) by mouth at bedtime as needed for anxiety.   buPROPion 300 MG 24 hr tablet Commonly known as:  Wellbutrin XL Take 1 tablet (300 mg total) by mouth daily.   hydrochlorothiazide 12.5 MG capsule Commonly known as:  MICROZIDE Take 1 capsule (12.5 mg total) by mouth daily.   levothyroxine 88 MCG tablet Commonly known as:  SYNTHROID Take 1 tablet (88 mcg total) by mouth daily before breakfast.   losartan 25 MG tablet Commonly known as:  COZAAR Take 2 tablets (50 mg total) by mouth daily.   pantoprazole 20 MG tablet Commonly known as:  PROTONIX Take 1 po BID x 2 weeks then once a day   rosuvastatin 10 MG tablet Commonly known as:  CRESTOR Take 10 mg by mouth daily.   terbinafine 250 MG tablet Commonly known as:  LAMISIL Take 1 tablet (250 mg total) by mouth daily.       History (reviewed): Past Medical History:  Diagnosis Date  . Abnormal EKG   . Allergic rhinitis   . Depression   . DJD (degenerative joint disease)    cervical and Rt. knee, Rt. shoulder rotator cuff tendonitis  . Dyspepsia   . H/O esophageal reflux    w/hiatal hernia  .  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 Hypertension   . Insomnia    Occassional  . Migraine   . Seborrheic keratoses    Scattered seborrheic keratoses   Past Surgical History:  Procedure Laterality Date  . ABDOMINAL HYSTERECTOMY  1986   Family History  Problem Relation Age of Onset  . Cancer Father        lung  . Arthritis Sister   . Breast cancer Neg Hx    Social History   Socioeconomic History  . Marital status: Married    Spouse name: Lollie Sails  .  Number of children: 3  . Years of education: Not on file  . Highest education level: 12th grade  Occupational History  . Occupation: Retired  Engineer, production  . Financial resource strain: Not hard at all  . Food insecurity:    Worry: Never true    Inability: Never true  . Transportation needs:    Medical: No    Non-medical: No  Tobacco Use  . Smoking status: Never Smoker  . Smokeless tobacco: Never Used  Substance and Sexual Activity  . Alcohol use: Never    Frequency: Never  . Drug use: Never  . Sexual activity: Not Currently  Lifestyle  . Physical activity:    Days per week: 2 days    Minutes per session: 30 min  . Stress: Not at all  Relationships  . Social connections:    Talks on phone: More than three times a week    Gets together: Not on file    Attends religious service: More than 4 times per year    Active member of club or organization: Yes    Attends meetings of clubs or organizations: More than 4 times per year    Relationship status: Married  Other Topics Concern  . Not on file  Social History Narrative  . Not on file    Activities of Daily Living In your present state of health, do you have any difficulty performing the following activities: 01/20/2019  Hearing? N  Vision? N  Difficulty concentrating or making decisions? N  Walking or climbing stairs? N  Dressing or bathing? N  Doing errands, shopping? N  Preparing Food and eating ? N  Using the Toilet? N  In the past six months, have you accidently leaked urine? N  Do you have problems with loss of bowel control? N  Managing your Medications? N  Managing your Finances? N  Housekeeping or managing your Housekeeping? N  Some recent data might be hidden    Patient Literacy How often do you need to have someone help you when you read instructions, pamphlets, or other written materials from your doctor or pharmacy?: 1 - Never What is the last grade level you completed in school?: Some college courses   Exercise Current Exercise Habits: Home exercise routine, Type of exercise: walking, Time (Minutes): 30, Frequency (Times/Week): 2, Weekly Exercise (Minutes/Week): 60, Intensity: Mild, Exercise limited by: None identified  Diet Patient reports consuming 3 meals a day and 2 snack(s) a day Patient reports that her primary diet is: Low fat Patient reports that she does have regular access to food.   Depression Screen PHQ 2/9 Scores 01/20/2019 09/26/2018 06/26/2018 05/27/2018  PHQ - 2 Score 0 4 2 1   PHQ- 9 Score - 14 9 -     Fall Risk Fall Risk  01/20/2019 09/26/2018 06/26/2018 05/27/2018  Falls in the past  year? 0 0 No No     Objective:  MINKA KNIGHT seemed alert and oriented and she participated appropriately during our telephone visit.  Blood Pressure Weight BMI  BP Readings from Last 3 Encounters:  09/26/18 (!) 144/73  06/26/18 135/81  05/27/18 (!) 170/80   Wt Readings from Last 3 Encounters:  01/20/19 145 lb (65.8 kg)  09/26/18 168 lb 9.6 oz (76.5 kg)  06/26/18 179 lb 3.2 oz (81.3 kg)   BMI Readings from Last 1 Encounters:  01/20/19 25.69 kg/m    *Unable to obtain current vital signs, weight, and BMI due to telephone visit type  Hearing/Vision  . Jarrett did not seem to have difficulty with hearing/understanding during the telephone conversation . Reports that she has had a formal eye exam by an eye care professional within the past year . Reports that she has not had a formal hearing evaluation within the past year *Unable to fully assess hearing and vision during telephone visit type  Cognitive Function: 6CIT Screen 01/20/2019  What Year? 0 points  What month? 0 points  What time? 0 points  Count back from 20 0 points  Months in reverse 0 points  Repeat phrase 0 points  Total Score 0    Normal Cognitive Function Screening: Yes (Normal:0-7, Significant for Dysfunction: >8)  Immunization & Health Maintenance Record Immunization History  Administered Date(s)  Administered  . Influenza, High Dose Seasonal PF 06/13/2017, 06/26/2018  . Pneumococcal Conjugate-13 08/24/2014  . Pneumococcal Polysaccharide-23 08/10/2011    Health Maintenance  Topic Date Due  . Janet Berlin  08/24/1965  . DEXA SCAN  08/25/2011  . PNA vac Low Risk Adult (2 of 2 - PPSV23) 08/09/2016  . INFLUENZA VACCINE  04/05/2019  . MAMMOGRAM  08/20/2020  . Fecal DNA (Cologuard)  05/04/2021  . Hepatitis C Screening  Completed       Assessment  This is a routine wellness examination for LURETTA EVERLY.  Health Maintenance: Due or Overdue Health Maintenance Due  Topic Date Due  . TETANUS/TDAP  08/24/1965  . DEXA SCAN  08/25/2011  . PNA vac Low Risk Adult (2 of 2 - PPSV23) 08/09/2016    Isabella Lindsey does not need a referral for Community Assistance: Care Management:   no Social Work:    no Prescription Assistance:  no Nutrition/Diabetes Education:  no   Plan:  Personalized Goals Goals Addressed            This Visit's Progress   . Exercise 3x per week (30 min per time)        Personalized Health Maintenance & Screening Recommendations  Pneumococcal vaccine  Td vaccine Bone densitometry screening  Lung Cancer Screening Recommended: no (Low Dose CT Chest recommended if Age 57-80 years, 30 pack-year currently smoking OR have quit w/in past 15 years) Hepatitis C Screening recommended: no HIV Screening recommended: no  Advanced Directives: Written information was not prepared per patient's request.  Referrals & Orders Orders Placed This Encounter  Procedures  . Cologuard    Follow-up Plan . Follow-up with Dettinger, Elige Radon, MD as planned   I have personally reviewed and noted the following in the patient's chart:   . Medical and social history . Use of alcohol, tobacco or illicit drugs  . Current medications and supplements . Functional ability and status . Nutritional status . Physical activity . Advanced directives . List of other  physicians . Hospitalizations, surgeries, and ER visits in previous 12 months . Vitals .  Screenings to include cognitive, depression, and falls . Referrals and appointments  In addition, I have reviewed and discussed with Isabella Lindsey certain preventive protocols, quality metrics, and best practice recommendations. A written personalized care plan for preventive services as well as general preventive health recommendations is available and can be mailed to the patient at her request.     Josetta Huddle, LPN  signature  01/20/2019

## 2019-03-10 DIAGNOSIS — H5203 Hypermetropia, bilateral: Secondary | ICD-10-CM | POA: Diagnosis not present

## 2019-03-10 DIAGNOSIS — H524 Presbyopia: Secondary | ICD-10-CM | POA: Diagnosis not present

## 2019-03-27 ENCOUNTER — Other Ambulatory Visit: Payer: Self-pay

## 2019-03-28 ENCOUNTER — Ambulatory Visit (INDEPENDENT_AMBULATORY_CARE_PROVIDER_SITE_OTHER): Payer: Medicare Other | Admitting: Family Medicine

## 2019-03-28 ENCOUNTER — Encounter: Payer: Self-pay | Admitting: Family Medicine

## 2019-03-28 VITALS — BP 145/66 | HR 79 | Temp 96.2°F | Ht 63.0 in | Wt 142.4 lb

## 2019-03-28 DIAGNOSIS — K219 Gastro-esophageal reflux disease without esophagitis: Secondary | ICD-10-CM | POA: Diagnosis not present

## 2019-03-28 DIAGNOSIS — F411 Generalized anxiety disorder: Secondary | ICD-10-CM | POA: Diagnosis not present

## 2019-03-28 DIAGNOSIS — I10 Essential (primary) hypertension: Secondary | ICD-10-CM | POA: Diagnosis not present

## 2019-03-28 DIAGNOSIS — E782 Mixed hyperlipidemia: Secondary | ICD-10-CM | POA: Diagnosis not present

## 2019-03-28 DIAGNOSIS — E039 Hypothyroidism, unspecified: Secondary | ICD-10-CM | POA: Diagnosis not present

## 2019-03-28 DIAGNOSIS — Z79891 Long term (current) use of opiate analgesic: Secondary | ICD-10-CM | POA: Diagnosis not present

## 2019-03-28 MED ORDER — BUPROPION HCL ER (XL) 300 MG PO TB24: 300.0000 mg | ORAL_TABLET | Freq: Every day | ORAL | 3 refills | Status: DC

## 2019-03-28 MED ORDER — ALPRAZOLAM 0.25 MG PO TABS: 0.2500 mg | ORAL_TABLET | Freq: Every evening | ORAL | 5 refills | Status: DC | PRN

## 2019-03-28 MED ORDER — PANTOPRAZOLE SODIUM 20 MG PO TBEC: 20.0000 mg | DELAYED_RELEASE_TABLET | Freq: Every day | ORAL | 3 refills | Status: DC

## 2019-03-28 NOTE — Progress Notes (Signed)
BP (!) 145/66    Pulse 79    Temp (!) 96.2 F (35.7 C) (Temporal)    Ht 5' 3"  (1.6 m)    Wt 142 lb 6.4 oz (64.6 kg)    BMI 25.23 kg/m    Subjective:   Patient ID: Isabella Lindsey, female    DOB: Nov 10, 1945, 73 y.o.   MRN: [id number]  HPI: Isabella Lindsey is a 73 y.o. female presenting on 03/28/2019 for Hypothyroidism (6 month follow up), Hypertension, and Weight Loss   HPI Hypothyroidism recheck Patient is coming in for thyroid recheck today as well. They deny any issues with hair changes or heat or cold problems or diarrhea or constipation. They deny any chest pain or palpitations. They are currently on levothyroxine 59mcrograms   GERD Patient is currently on Protonix but she is only been taking a couple times a week.  She is having a lot more belching and burping especially in the morning she wakes up congestion and she feels like things get stuck in her throat sometimes.  This is been occurring more over the past few months and she is actually lost she feels like close to 25 pounds over the past 6 months.   Hypertension Patient is currently on losartan and hydrochlorothiazide, and their blood pressure today is 145/66. Patient denies any lightheadedness or dizziness. Patient denies headaches, blurred vision, chest pains, shortness of breath, or weakness. Denies any side effects from medication and is content with current medication.   Anxiety and insomnia Patient is also coming in discuss anxiety and insomnia.  She currently uses Wellbutrin during the day but only been using half of the 300 at 150 mg and sometimes she will take the whole.  She has also been using the 0.25 Xanax at bedtime to help with sleep and that does help but she has been a lot more anxious recently, her mother had a fall a month ago and there have been a lot of stress about that because she thinks she fractured something but her mobility has greatly been decreased and she did not want to go see a doctor.  She is  also had a child going to graduation with all of the coronavirus stuff and that was a challenge and stress.  Patient denies any suicidal ideations or thoughts of hurting herself.  She is concerned because her appetite has been down and she has had some weight loss [facility] 03/28/2019 01/20/2019 09/26/2018 06/26/2018 05/27/2018  Decreased Interest 0 0 2 0 0  Down, Depressed, Hopeless 0 0 2 2 1   PHQ - 2 Score 0 0 4 2 1   Altered sleeping - - 2 2 -  Tired, decreased energy - - 2 1 -  Change in appetite - - 3 2 -  Feeling bad or failure about yourself  - - 2 1 -  Trouble concentrating - - 1 1 -  Moving slowly or fidgety/restless - - 0 0 -  Suicidal thoughts - - 0 0 -  PHQ-9 Score - - 14 9 -     Relevant past medical, surgical, family and social history reviewed and updated as indicated. Interim medical history since our last visit reviewed. Allergies and medications reviewed and updated.  Review of Systems  Constitutional: Positive for appetite change and unexpected weight change. Negative for chills and fever.  Eyes: Negative for redness and visual disturbance.  Respiratory: Negative for chest tightness and shortness of breath.   Cardiovascular: Negative for chest  pain and leg swelling.  Musculoskeletal: Negative for back pain and gait problem.  Skin: Negative for rash.  Neurological: Negative for light-headedness and headaches.  Psychiatric/Behavioral: Positive for dysphoric mood. Negative for agitation, behavioral problems, self-injury, sleep disturbance and suicidal ideas. The patient is nervous/anxious.   All other systems reviewed and are negative.   Per HPI unless specifically indicated above   Allergies as of 03/28/2019      Reactions   Effexor [venlafaxine]    Dizziness   Penicillins    Erythromycin Rash      Medication List       Accurate as of March 28, 2019  9:59 AM. If you have any questions, ask your nurse or doctor.        ALPRAZolam 0.25 MG  tablet Commonly known as: XANAX Take 1 tablet (0.25 mg total) by mouth at bedtime as needed for anxiety.   buPROPion 300 MG 24 hr tablet Commonly known as: WELLBUTRIN XL Take 1 tablet (300 mg total) by mouth daily.   hydrochlorothiazide 12.5 MG capsule Commonly known as: MICROZIDE Take 1 capsule (12.5 mg total) by mouth daily.   levothyroxine 88 MCG tablet Commonly known as: SYNTHROID Take 1 tablet (88 mcg total) by mouth daily before breakfast.   losartan 25 MG tablet Commonly known as: COZAAR Take 2 tablets (50 mg total) by mouth daily.   pantoprazole 20 MG tablet Commonly known as: PROTONIX Take 1 tablet (20 mg total) by mouth daily. Take 1 po BID x 2 weeks then once a day What changed:   how much to take  how to take this  when to take this Changed by: Worthy Rancher, MD   rosuvastatin 10 MG tablet Commonly known as: CRESTOR Take 10 mg by mouth daily.   terbinafine 250 MG tablet Commonly known as: LAMISIL Take 1 tablet (250 mg total) by mouth daily.        Objective:   BP (!) 145/66    Pulse 79    Temp (!) 96.2 F (35.7 C) (Temporal)    Ht 5' 3"  (1.6 m)    Wt 142 lb 6.4 oz (64.6 kg)    BMI 25.23 kg/m   Wt Readings from Last 3 Encounters:  03/28/19 142 lb 6.4 oz (64.6 kg)  01/20/19 145 lb (65.8 kg)  09/26/18 168 lb 9.6 oz (76.5 kg)    Physical Exam Vitals signs and nursing note reviewed.  Constitutional:      General: She is not in acute distress.    Appearance: She is well-developed. She is not diaphoretic.  Eyes:     Conjunctiva/sclera: Conjunctivae normal.     Pupils: Pupils are equal, round, and reactive to light.  Cardiovascular:     Rate and Rhythm: Normal rate and regular rhythm.     Heart sounds: Normal heart sounds. No murmur.  Pulmonary:     Effort: Pulmonary effort is normal. No respiratory distress.     Breath sounds: Normal breath sounds. No wheezing.  Musculoskeletal: Normal range of motion.        General: No tenderness.   Skin:    General: Skin is warm and dry.     Findings: No rash.  Neurological:     Mental Status: She is alert and oriented to person, place, and time.     Coordination: Coordination normal.  Psychiatric:        Mood and Affect: Mood is anxious and depressed.        Behavior: Behavior normal.  Thought Content: Thought content does not include suicidal ideation. Thought content does not include suicidal plan.       Assessment & Plan:   Problem List Items Addressed This Visit      Cardiovascular and Mediastinum   Hypertension   Relevant Orders   CMP14+EGFR     Digestive   GERD (gastroesophageal reflux disease)   Relevant Medications   pantoprazole (PROTONIX) 20 MG tablet     Endocrine   Hypothyroidism - Primary   Relevant Orders   TSH   CMP14+EGFR   CBC with Differential/Platelet     Other   Hyperlipidemia   GAD (generalized anxiety disorder)   Relevant Medications   ALPRAZolam (XANAX) 0.25 MG tablet   buPROPion (WELLBUTRIN XL) 300 MG 24 hr tablet   Other Relevant Orders   TSH   CBC with Differential/Platelet   ToxASSURE Select 13 (MW), Urine    Continue Xanax, recommended for her to start taking Wellbutrin 300 mg every day, the whole tablet, also recommended for her to start taking Protonix every day and will see back in a couple months to reassess. Continue hypertension medication and thyroid and will check blood work today  Follow up plan: Return in about 8 weeks (around 05/23/2019), or if symptoms worsen or fail to improve, for Anxiety and weight loss and GERD recheck.  Counseling provided for all of the vaccine components Orders Placed This Encounter  Procedures   TSH   CMP14+EGFR   CBC with Differential/Platelet   ToxASSURE Select 13 (MW), Urine    Caryl Pina, MD [facility] 03/28/2019, 9:59 AM

## 2019-03-29 LAB — CMP14+EGFR
ALT: 14 IU/L (ref 0–32)
AST: 18 IU/L (ref 0–40)
Albumin/Globulin Ratio: 1.8 (ref 1.2–2.2)
Albumin: 4.3 g/dL (ref 3.7–4.7)
Alkaline Phosphatase: 69 IU/L (ref 39–117)
BUN/Creatinine Ratio: 16 (ref 12–28)
BUN: 21 mg/dL (ref 8–27)
Bilirubin Total: 0.4 mg/dL (ref 0.0–1.2)
CO2: 24 mmol/L (ref 20–29)
Calcium: 10.1 mg/dL (ref 8.7–10.3)
Chloride: 102 mmol/L (ref 96–106)
Creatinine, Ser: 1.31 mg/dL — ABNORMAL HIGH (ref 0.57–1.00)
GFR calc Af Amer: 47 mL/min/{1.73_m2} — ABNORMAL LOW (ref >59)
GFR calc non Af Amer: 41 mL/min/{1.73_m2} — ABNORMAL LOW (ref >59)
Globulin, Total: 2.4 g/dL (ref 1.5–4.5)
Glucose: 96 mg/dL (ref 65–99)
Potassium: 3.8 mmol/L (ref 3.5–5.2)
Sodium: 142 mmol/L (ref 134–144)
Total Protein: 6.7 g/dL (ref 6.0–8.5)

## 2019-03-29 LAB — CBC WITH DIFFERENTIAL/PLATELET
Basophils Absolute: 0.1 10*3/uL (ref 0.0–0.2)
Basos: 1 %
EOS (ABSOLUTE): 0.2 10*3/uL (ref 0.0–0.4)
Eos: 2 %
Hematocrit: 38.9 % (ref 34.0–46.6)
Hemoglobin: 12.9 g/dL (ref 11.1–15.9)
Immature Grans (Abs): 0 10*3/uL (ref 0.0–0.1)
Immature Granulocytes: 0 %
Lymphocytes Absolute: 2 10*3/uL (ref 0.7–3.1)
Lymphs: 29 %
MCH: 28.9 pg (ref 26.6–33.0)
MCHC: 33.2 g/dL (ref 31.5–35.7)
MCV: 87 fL (ref 79–97)
Monocytes Absolute: 0.5 10*3/uL (ref 0.1–0.9)
Monocytes: 7 %
Neutrophils Absolute: 4.2 10*3/uL (ref 1.4–7.0)
Neutrophils: 61 %
Platelets: 302 10*3/uL (ref 150–450)
RBC: 4.46 x10E6/uL (ref 3.77–5.28)
RDW: 12.3 % (ref 11.7–15.4)
WBC: 6.9 10*3/uL (ref 3.4–10.8)

## 2019-03-29 LAB — TSH: TSH: 0.198 u[IU]/mL — ABNORMAL LOW (ref 0.450–4.500)

## 2019-03-31 ENCOUNTER — Other Ambulatory Visit: Payer: Self-pay | Admitting: *Deleted

## 2019-03-31 DIAGNOSIS — E039 Hypothyroidism, unspecified: Secondary | ICD-10-CM

## 2019-03-31 MED ORDER — LEVOTHYROXINE SODIUM 75 MCG PO TABS: 75.0000 ug | ORAL_TABLET | Freq: Every day | ORAL | 3 refills | Status: DC

## 2019-04-02 LAB — TOXASSURE SELECT 13 (MW), URINE

## 2019-04-09 ENCOUNTER — Other Ambulatory Visit: Payer: Self-pay | Admitting: Family Medicine

## 2019-04-09 DIAGNOSIS — F411 Generalized anxiety disorder: Secondary | ICD-10-CM

## 2019-04-11 ENCOUNTER — Other Ambulatory Visit: Payer: Self-pay | Admitting: *Deleted

## 2019-04-11 MED ORDER — HYDROCHLOROTHIAZIDE 12.5 MG PO CAPS: 12.5000 mg | ORAL_CAPSULE | Freq: Every day | ORAL | 1 refills | Status: DC

## 2019-05-28 ENCOUNTER — Other Ambulatory Visit: Payer: Self-pay | Admitting: Family Medicine

## 2019-06-02 ENCOUNTER — Other Ambulatory Visit: Payer: Self-pay

## 2019-06-02 ENCOUNTER — Encounter: Payer: Self-pay | Admitting: Family Medicine

## 2019-06-02 ENCOUNTER — Ambulatory Visit (INDEPENDENT_AMBULATORY_CARE_PROVIDER_SITE_OTHER): Payer: Medicare Other | Admitting: Family Medicine

## 2019-06-02 DIAGNOSIS — E041 Nontoxic single thyroid nodule: Secondary | ICD-10-CM | POA: Diagnosis not present

## 2019-06-02 DIAGNOSIS — K219 Gastro-esophageal reflux disease without esophagitis: Secondary | ICD-10-CM

## 2019-06-02 DIAGNOSIS — E039 Hypothyroidism, unspecified: Secondary | ICD-10-CM | POA: Diagnosis not present

## 2019-06-02 MED ORDER — PANTOPRAZOLE SODIUM 20 MG PO TBEC: 20.0000 mg | DELAYED_RELEASE_TABLET | Freq: Two times a day (BID) | ORAL | 3 refills | Status: DC

## 2019-06-02 NOTE — Progress Notes (Signed)
Virtual Visit via telephone Note  I connected with Isabella Lindsey on 06/02/19 at 1412 by telephone and verified that I am speaking with the correct person using two identifiers. Isabella Lindsey is currently located at car and no other people are currently with her during visit. The provider, Elige Radon Tammi Boulier, MD is located in their office at time of visit.  Call ended at 1425  I discussed the limitations, risks, security and privacy concerns of performing an evaluation and management service by telephone and the availability of in person appointments. I also discussed with the patient that there may be a patient responsible charge related to this service. The patient expressed understanding and agreed to proceed.   History and Present Illness: Patient is still having no appetite and can't eat red meat and is losing weight, lost 3 pounds since last time.  Patient does feel like the Wellbutrin is helping and she just feels like she can't. She is eating chicken and fruits and protein drinks.  She had EGD 2 years ago. When she eats she gets hiccups. She feels she can't eat.   Hypothyroidism recheck Patient is coming in for thyroid recheck today as well. They deny any issues with hair changes or heat or cold problems or diarrhea or constipation. They deny any chest pain or palpitations. They are currently on levothyroxine   No diagnosis found.  Outpatient Encounter Medications as of 06/02/2019  Medication Sig  . ALPRAZolam (XANAX) 0.25 MG tablet Take 1 tablet (0.25 mg total) by mouth at bedtime as needed for anxiety.  [name] buPROPion (WELLBUTRIN XL) 300 MG 24 hr tablet Take 1 tablet (300 mg total) by mouth daily.  . hydrochlorothiazide (MICROZIDE) 12.5 MG capsule Take 1 capsule (12.5 mg total) by mouth daily.  [name] levothyroxine (SYNTHROID) 75 MCG tablet Take 1 tablet (75 mcg total) by mouth daily.  [name] losartan (COZAAR) 25 MG tablet TAKE 2 TABLETS BY MOUTH  DAILY  . pantoprazole  (PROTONIX) 20 MG tablet Take 1 tablet (20 mg total) by mouth daily. Take 1 po BID x 2 weeks then once a day  . rosuvastatin (CRESTOR) 10 MG tablet Take 10 mg by mouth daily.  [name] terbinafine (LAMISIL) 250 MG tablet Take 1 tablet (250 mg total) by mouth daily.   No facility-administered encounter medications on file as of 06/02/2019.     Review of Systems  Constitutional: Negative for chills and fever.  Eyes: Negative for visual disturbance.  Respiratory: Negative for chest tightness and shortness of breath.   Cardiovascular: Negative for chest pain and leg swelling.  Gastrointestinal: Positive for abdominal pain and nausea. Negative for constipation, diarrhea and vomiting.  Musculoskeletal: Negative for back pain and gait problem.  Skin: Negative for rash.  Neurological: Negative for light-headedness and headaches.  Psychiatric/Behavioral: Positive for dysphoric mood. Negative for agitation, behavioral problems, self-injury, sleep disturbance and suicidal ideas. The patient is nervous/anxious.   All other systems reviewed and are negative.   Observations/Objective: Patient sounds comfortable and in no acute distress  Assessment and Plan: Problem List Items Addressed This Visit      Digestive   GERD (gastroesophageal reflux disease)   Relevant Medications   pantoprazole (PROTONIX) 20 MG tablet     Endocrine   Hypothyroidism - Primary   Relevant Orders   US THYROID   TSH    Other Visit Diagnoses    Thyroid nodule       Relevant Orders   US THYROID  Follow Up Instructions:  F/u 3 months, recheck thyroid dose, increased protonix, consider EGD or GI referral if doesn't work   I discussed the assessment and treatment plan with the patient. The patient was provided an opportunity to ask questions and all were answered. The patient agreed with the plan and demonstrated an understanding of the instructions.   The patient was advised to call back or seek an in-person  evaluation if the symptoms worsen or if the condition fails to improve as anticipated.  The above assessment and management plan was discussed with the patient. The patient verbalized understanding of and has agreed to the management plan. Patient is aware to call the clinic if symptoms persist or worsen. Patient is aware when to return to the clinic for a follow-up visit. Patient educated on when it is appropriate to go to the emergency department.    I provided 13 minutes of non-face-to-face time during this encounter.    Worthy Rancher, MD

## 2019-06-03 ENCOUNTER — Other Ambulatory Visit (INDEPENDENT_AMBULATORY_CARE_PROVIDER_SITE_OTHER): Payer: Medicare Other

## 2019-06-03 DIAGNOSIS — Z23 Encounter for immunization: Secondary | ICD-10-CM

## 2019-06-03 DIAGNOSIS — E039 Hypothyroidism, unspecified: Secondary | ICD-10-CM | POA: Diagnosis not present

## 2019-06-04 LAB — TSH: TSH: 0.781 u[IU]/mL (ref 0.450–4.500)

## 2019-06-06 ENCOUNTER — Telehealth: Payer: Self-pay | Admitting: Family Medicine

## 2019-06-06 NOTE — Telephone Encounter (Signed)
Aware of results. 

## 2019-06-10 ENCOUNTER — Ambulatory Visit (HOSPITAL_COMMUNITY)
Admission: RE | Admit: 2019-06-10 | Discharge: 2019-06-10 | Disposition: A | Discharge status: Home / Self Care | Payer: Medicare Other | Source: Ambulatory Visit | Attending: Family Medicine | Admitting: Family Medicine

## 2019-06-10 ENCOUNTER — Other Ambulatory Visit: Payer: Self-pay

## 2019-06-10 DIAGNOSIS — E041 Nontoxic single thyroid nodule: Secondary | ICD-10-CM | POA: Diagnosis not present

## 2019-06-10 DIAGNOSIS — E042 Nontoxic multinodular goiter: Secondary | ICD-10-CM | POA: Diagnosis not present

## 2019-06-10 DIAGNOSIS — E039 Hypothyroidism, unspecified: Secondary | ICD-10-CM | POA: Insufficient documentation

## 2019-06-11 ENCOUNTER — Other Ambulatory Visit: Payer: Self-pay | Admitting: *Deleted

## 2019-06-11 DIAGNOSIS — E041 Nontoxic single thyroid nodule: Secondary | ICD-10-CM

## 2019-07-30 ENCOUNTER — Ambulatory Visit: Payer: Medicare Other | Admitting: Family Medicine

## 2019-09-10 ENCOUNTER — Other Ambulatory Visit: Payer: Self-pay | Admitting: Family Medicine

## 2019-09-10 DIAGNOSIS — B351 Tinea unguium: Secondary | ICD-10-CM

## 2019-09-18 ENCOUNTER — Other Ambulatory Visit: Payer: Self-pay

## 2019-09-19 ENCOUNTER — Encounter: Payer: Self-pay | Admitting: Family Medicine

## 2019-09-19 ENCOUNTER — Ambulatory Visit (INDEPENDENT_AMBULATORY_CARE_PROVIDER_SITE_OTHER): Payer: Medicare Other | Admitting: Family Medicine

## 2019-09-19 VITALS — BP 163/78 | HR 77 | Temp 98.0°F | Ht 63.0 in | Wt 141.8 lb

## 2019-09-19 DIAGNOSIS — B351 Tinea unguium: Secondary | ICD-10-CM | POA: Diagnosis not present

## 2019-09-19 DIAGNOSIS — E782 Mixed hyperlipidemia: Secondary | ICD-10-CM

## 2019-09-19 DIAGNOSIS — I10 Essential (primary) hypertension: Secondary | ICD-10-CM

## 2019-09-19 DIAGNOSIS — Z23 Encounter for immunization: Secondary | ICD-10-CM | POA: Diagnosis not present

## 2019-09-19 DIAGNOSIS — E039 Hypothyroidism, unspecified: Secondary | ICD-10-CM

## 2019-09-19 DIAGNOSIS — F411 Generalized anxiety disorder: Secondary | ICD-10-CM | POA: Diagnosis not present

## 2019-09-19 DIAGNOSIS — K219 Gastro-esophageal reflux disease without esophagitis: Secondary | ICD-10-CM | POA: Diagnosis not present

## 2019-09-19 DIAGNOSIS — Z79891 Long term (current) use of opiate analgesic: Secondary | ICD-10-CM | POA: Diagnosis not present

## 2019-09-19 DIAGNOSIS — Z0000 Encounter for general adult medical examination without abnormal findings: Secondary | ICD-10-CM | POA: Diagnosis not present

## 2019-09-19 MED ORDER — ROSUVASTATIN CALCIUM 10 MG PO TABS: 10.0000 mg | ORAL_TABLET | Freq: Every day | ORAL | 3 refills | Status: DC

## 2019-09-19 MED ORDER — ALPRAZOLAM 0.25 MG PO TABS: 0.2500 mg | ORAL_TABLET | Freq: Every evening | ORAL | 5 refills | Status: DC | PRN

## 2019-09-19 MED ORDER — HYDROCHLOROTHIAZIDE 12.5 MG PO CAPS: 12.5000 mg | ORAL_CAPSULE | Freq: Every day | ORAL | 3 refills | Status: DC

## 2019-09-19 MED ORDER — LOSARTAN POTASSIUM 25 MG PO TABS: 50.0000 mg | ORAL_TABLET | Freq: Every day | ORAL | 3 refills | Status: DC

## 2019-09-19 MED ORDER — TERBINAFINE HCL 250 MG PO TABS: 250.0000 mg | ORAL_TABLET | Freq: Every day | ORAL | 3 refills | Status: DC

## 2019-09-19 NOTE — Addendum Note (Signed)
Addended by: Lorelee Cover C on: 09/19/2019 10:57 AM   Modules accepted: Orders

## 2019-09-19 NOTE — Progress Notes (Signed)
BP (!) 163/78   Pulse 77   Temp 98 F (36.7 C) (Temporal)   Ht 5' 3"  (1.6 m)   Wt 141 lb 12.8 oz (64.3 kg)   SpO2 100%   BMI 25.12 kg/m    Subjective:   Patient ID: Isabella Lindsey, female    DOB: November 27, 1945, 74 y.o.   MRN: [id number]  HPI: Isabella Lindsey is a 74 y.o. female presenting on 09/19/2019 for Annual Exam   HPI Annual exam and recheck of chronic issues. Patient is coming in for annual exam and recheck of chronic issues today.  She denies any major health issues and feels like she is doing very well.  She is trying to be more active physically.  Hypothyroidism recheck Patient is coming in for thyroid recheck today as well. They deny any issues with hair changes or heat or cold problems or diarrhea or constipation. They deny any chest pain or palpitations. They are currently on levothyroxine hydrochlorothiazide and losartan micrograms   Hyperlipidemia Patient is coming in for recheck of his hyperlipidemia. The patient is currently taking rosuvastatin. They deny any issues with myalgias or history of liver damage from it. They deny any focal numbness or weakness or chest pain.   GERD Patient is currently on Protonix.  She denies any major symptoms or abdominal pain or belching or burping. She denies any blood in her stool or lightheadedness or dizziness.  She has been getting occasional where she swallows and to get stuck sometimes.  We discussed this possibly being related to acid and she does not want to proceed with any procedures or anything right now because of Covid.  Anxiety recheck Current rx-alprazolam 0.25 nightly as needed # meds rx-30 Effectiveness of current meds-works well, is able to use it to sleep and calm her mind down Adverse reactions form meds-none  Pill count performed-No Last drug screen -04/03/2019 ( high risk q48m moderate risk q671mlow risk yearly ) Urine drug screen today- Yes [place] [facility] [place]  If yes were their any concerning  findings? -None  No flowsheet data found.   Controlled substance contract signed on: 04/03/2019 and renewed today  Relevant past medical, surgical, family and social history reviewed and updated as indicated. Interim medical history since our last visit reviewed. Allergies and medications reviewed and updated.  Review of Systems  Constitutional: Negative for chills and fever.  HENT: Negative for congestion, ear discharge and ear pain.   Eyes: Negative for redness and visual disturbance.  Respiratory: Negative for chest tightness and shortness of breath.   Cardiovascular: Negative for chest pain and leg swelling.  Genitourinary: Negative for difficulty urinating and dysuria.  Musculoskeletal: Negative for back pain and gait problem.  Skin: Negative for rash.  Neurological: Negative for light-headedness and headaches.  Psychiatric/Behavioral: Positive for sleep disturbance. Negative for agitation, behavioral problems, decreased concentration, dysphoric mood, self-injury and suicidal ideas. The patient is not nervous/anxious.   All other systems reviewed and are negative.   Per HPI unless specifically indicated above   Allergies as of 09/19/2019      Reactions   Effexor [venlafaxine]    Dizziness   Penicillins    Erythromycin Rash      Medication List       Accurate as of September 19, 2019 10:51 AM. If you have any questions, ask your nurse or doctor.        STOP taking these medications   terbinafine 250 MG tablet Commonly known as: LAMISIL  Stopped by: Worthy Rancher, MD     TAKE these medications   ALPRAZolam 0.25 MG tablet Commonly known as: XANAX Take 1 tablet (0.25 mg total) by mouth at bedtime as needed for anxiety. Start taking on: October 01, 2019 What changed: These instructions start on October 01, 2019. If you are unsure what to do until then, ask your doctor or other care provider. Changed by: Fransisca Kaufmann Emony Dormer, MD   buPROPion 300 MG 24 hr  tablet Commonly known as: WELLBUTRIN XL Take 1 tablet (300 mg total) by mouth daily.   hydrochlorothiazide 12.5 MG capsule Commonly known as: MICROZIDE Take 1 capsule (12.5 mg total) by mouth daily.   levothyroxine 75 MCG tablet Commonly known as: SYNTHROID Take 1 tablet (75 mcg total) by mouth daily.   losartan 25 MG tablet Commonly known as: COZAAR Take 2 tablets (50 mg total) by mouth daily.   pantoprazole 20 MG tablet Commonly known as: PROTONIX Take 1 tablet (20 mg total) by mouth 2 (two) times daily before a meal. Take 1 po BID x 2 weeks then once a day   rosuvastatin 10 MG tablet Commonly known as: CRESTOR Take 1 tablet (10 mg total) by mouth daily.        Objective:   BP (!) 163/78   Pulse 77   Temp 98 F (36.7 C) (Temporal)   Ht 5' 3"  (1.6 m)   Wt 141 lb 12.8 oz (64.3 kg)   SpO2 100%   BMI 25.12 kg/m   Wt Readings from Last 3 Encounters:  09/19/19 141 lb 12.8 oz (64.3 kg)  03/28/19 142 lb 6.4 oz (64.6 kg)  01/20/19 145 lb (65.8 kg)    Physical Exam Vitals and nursing note reviewed.  Constitutional:      General: She is not in acute distress.    Appearance: She is well-developed. She is not diaphoretic.  Eyes:     Conjunctiva/sclera: Conjunctivae normal.  Cardiovascular:     Rate and Rhythm: Normal rate and regular rhythm.     Heart sounds: Normal heart sounds. No murmur.  Pulmonary:     Effort: Pulmonary effort is normal. No respiratory distress.     Breath sounds: Normal breath sounds. No wheezing.  Musculoskeletal:        General: No tenderness. Normal range of motion.  Skin:    General: Skin is warm and dry.     Findings: No rash.  Neurological:     Mental Status: She is alert and oriented to person, place, and time.     Coordination: Coordination normal.  Psychiatric:        Behavior: Behavior normal.       Assessment & Plan:   Problem List Items Addressed This Visit      Cardiovascular and Mediastinum   Hypertension    Relevant Medications   hydrochlorothiazide (MICROZIDE) 12.5 MG capsule   losartan (COZAAR) 25 MG tablet   rosuvastatin (CRESTOR) 10 MG tablet   Other Relevant Orders   CMP14+EGFR     Digestive   GERD (gastroesophageal reflux disease)   Relevant Orders   CBC with Differential/Platelet     Endocrine   Hypothyroidism   Relevant Orders   TSH     Other   Hyperlipidemia   Relevant Medications   hydrochlorothiazide (MICROZIDE) 12.5 MG capsule   losartan (COZAAR) 25 MG tablet   rosuvastatin (CRESTOR) 10 MG tablet   Other Relevant Orders   Lipid panel   GAD (generalized anxiety disorder)  Relevant Medications   ALPRAZolam (XANAX) 0.25 MG tablet (Start on 10/01/2019)   Other Relevant Orders   CBC with Differential/Platelet   DRUG SCREEN-TOXASSURE    Other Visit Diagnoses    Well adult exam    -  Primary   Onychomycosis          Continue current medication, no changes, refilled her Crestor, refill her Xanax as well. Follow up plan: Return in about 6 months (around 03/18/2020), or if symptoms worsen or fail to improve, for Recheck anxiety and thyroid.  Counseling provided for all of the vaccine components Orders Placed This Encounter  Procedures  . CBC with Differential/Platelet  . CMP14+EGFR  . Lipid panel  . TSH  . DRUG SCREEN-TOXASSURE    Isabella Pina, MD [facility] 09/19/2019, 10:51 AM

## 2019-09-19 NOTE — Patient Instructions (Signed)
We are committed to keeping you informed about the COVID-19 vaccine.  As the vaccine continues to become available for each phase, we will ensure that patients who meet the criteria receive the information they need to access vaccination opportunities. Continue to check your MyChart account and [email or web address] for updates. Please review the Phase 1b information below.  Following [place]'s guidelines for the distribution of COVID-19 vaccines, we are pleased to share our plans to begin offering vaccines to those 75 and older (Phase 1b). Here are details of those plans:  [facility] COVID-19 [facility]ination [facility] . Appointments required. . Open to those age 75 or older . Not restricted to [place] or [place] residents . Location: [facility] - [address], [place], [place]  . Offered daily beginning: Saturday, September 13, 2019 . Hours: 8 a.m. to 1 p.m. (10 a.m. to 2 p.m. this Saturday and Sunday, Jan. 9 and 10) . Registration for vaccine clinic appointments is open as of 10 a.m., Friday, January 8 . Please visit [email or web address] to register or call [phone] . There will be no copay required for the vaccine. Insurance information will be requested if available.  [facility] will offer this vaccination clinic through Sunday, January 17, after which we will switch to a larger location to offer public vaccination at a larger scale following state guidelines. We will provide additional information as details become available.   In addition to the clinic above, we are working in partnership with county health agencies in , [place], [place] and [place] counties to ensure continuing vaccination availability in alignment with state guidelines in the weeks and months ahead.   Information on phase 1b COVID-19 vaccination clinics being offered by local county health agencies is provided on each county health department's website.  [place]'s phase 1b vaccination guidelines, prioritizing those 75 and over as the next eligible group to receive the COVID-19 vaccine, are detailed at [email or web address].   Additional information: Those 75 and older who register for [facility]'s COVID-19 vaccination clinic at [facility] will be provided with additional information, including the need to be observed for 15 minutes following vaccination for your safety. Our COVID-19 testing clinic will not start at this site until 2 p.m. daily to ensure no people arriving for vaccination intersect with those seeking a COVID-19 test. Our [facility] rooms at the [facility] are clean and safe, and our staff will use all appropriate protective equipment to keep you safe. This vaccine clinic will be located in a completely separate area of this facility than where health care is provided. No interaction will occur between patients or care teams at this site and our vaccination clinic.   As we proceed through phases of the vaccine rollout, we remind everyone to remain vigilant in practicing the 3 W's - wear a mask, wash your hands and wait 6 feet apart from others. These safety practices are based in science and are the best tool we have to reduce the spread of the virus.   For our most current information, please visit [email or web address].  

## 2019-09-20 LAB — CMP14+EGFR
ALT: 12 IU/L (ref 0–32)
AST: 18 IU/L (ref 0–40)
Albumin/Globulin Ratio: 1.9 (ref 1.2–2.2)
Albumin: 4.3 g/dL (ref 3.7–4.7)
Alkaline Phosphatase: 64 IU/L (ref 39–117)
BUN/Creatinine Ratio: 17 (ref 12–28)
BUN: 22 mg/dL (ref 8–27)
Bilirubin Total: 0.2 mg/dL (ref 0.0–1.2)
CO2: 25 mmol/L (ref 20–29)
Calcium: 10.4 mg/dL — ABNORMAL HIGH (ref 8.7–10.3)
Chloride: 101 mmol/L (ref 96–106)
Creatinine, Ser: 1.31 mg/dL — ABNORMAL HIGH (ref 0.57–1.00)
GFR calc Af Amer: 47 mL/min/{1.73_m2} — ABNORMAL LOW (ref >59)
GFR calc non Af Amer: 40 mL/min/{1.73_m2} — ABNORMAL LOW (ref >59)
Globulin, Total: 2.3 g/dL (ref 1.5–4.5)
Glucose: 90 mg/dL (ref 65–99)
Potassium: 3.9 mmol/L (ref 3.5–5.2)
Sodium: 140 mmol/L (ref 134–144)
Total Protein: 6.6 g/dL (ref 6.0–8.5)

## 2019-09-20 LAB — CBC WITH DIFFERENTIAL/PLATELET
Basophils Absolute: 0.1 10*3/uL (ref 0.0–0.2)
Basos: 1 %
EOS (ABSOLUTE): 0.2 10*3/uL (ref 0.0–0.4)
Eos: 3 %
Hematocrit: 37.6 % (ref 34.0–46.6)
Hemoglobin: 12.6 g/dL (ref 11.1–15.9)
Immature Grans (Abs): 0 10*3/uL (ref 0.0–0.1)
Immature Granulocytes: 0 %
Lymphocytes Absolute: 2.1 10*3/uL (ref 0.7–3.1)
Lymphs: 30 %
MCH: 29.4 pg (ref 26.6–33.0)
MCHC: 33.5 g/dL (ref 31.5–35.7)
MCV: 88 fL (ref 79–97)
Monocytes Absolute: 0.4 10*3/uL (ref 0.1–0.9)
Monocytes: 6 %
Neutrophils Absolute: 4.1 10*3/uL (ref 1.4–7.0)
Neutrophils: 60 %
Platelets: 310 10*3/uL (ref 150–450)
RBC: 4.28 x10E6/uL (ref 3.77–5.28)
RDW: 12.5 % (ref 11.7–15.4)
WBC: 6.9 10*3/uL (ref 3.4–10.8)

## 2019-09-20 LAB — TSH: TSH: 2.94 u[IU]/mL (ref 0.450–4.500)

## 2019-09-20 LAB — LIPID PANEL
Chol/HDL Ratio: 3.7 ratio (ref 0.0–4.4)
Cholesterol, Total: 247 mg/dL — ABNORMAL HIGH (ref 100–199)
HDL: 67 mg/dL (ref >39)
LDL Chol Calc (NIH): 155 mg/dL — ABNORMAL HIGH (ref 0–99)
Triglycerides: 142 mg/dL (ref 0–149)
VLDL Cholesterol Cal: 25 mg/dL (ref 5–40)

## 2019-09-23 LAB — TOXASSURE SELECT 13 (MW), URINE

## 2019-09-24 ENCOUNTER — Telehealth: Payer: Self-pay | Admitting: Family Medicine

## 2019-09-24 NOTE — Telephone Encounter (Signed)
Erroneous encounter

## 2019-10-18 ENCOUNTER — Other Ambulatory Visit: Payer: Self-pay

## 2019-10-18 ENCOUNTER — Ambulatory Visit: Payer: Medicare Other | Attending: Internal Medicine

## 2019-10-18 DIAGNOSIS — Z23 Encounter for immunization: Secondary | ICD-10-CM

## 2019-10-18 NOTE — Progress Notes (Signed)
   Covid-19 Vaccination Clinic  Name:  Isabella Lindsey    MRN: [id number] DOB: November 29, 1945  10/18/2019  Isabella Lindsey was observed post Covid-19 immunization for 30 minutes based on pre-vaccination screening without incidence. She was provided with Vaccine Information Sheet and instruction to access the V-Safe system.   Isabella Lindsey was instructed to call 911 with any severe reactions post vaccine: [name] Difficulty breathing  . Swelling of your face and throat  . A fast heartbeat  . A bad rash all over your body  . Dizziness and weakness    Immunizations Administered    Name Date Dose VIS Date Route   Moderna COVID-19 Vaccine 10/18/2019  1:40 PM 0.5 mL 08/05/2019 Intramuscular   Manufacturer: Moderna   Lot: [id number]   NDC: 24580-998-33

## 2019-11-05 ENCOUNTER — Telehealth: Payer: Self-pay | Admitting: Family Medicine

## 2019-11-05 DIAGNOSIS — F411 Generalized anxiety disorder: Secondary | ICD-10-CM

## 2019-11-05 MED ORDER — BUPROPION HCL ER (XL) 300 MG PO TB24: 300.0000 mg | ORAL_TABLET | Freq: Every day | ORAL | 0 refills | Status: DC

## 2019-11-05 NOTE — Telephone Encounter (Signed)
.   Medication Request  11/05/2019  What is the name of the medication? dutropion   Have you contacted your pharmacy to request a refill? Yes   [facility]   Patient notified that their request is being sent to the clinical staff for review and that they should receive a call once it is complete. If they do not receive a call within 24 hours they can check with their pharmacy or our office.

## 2019-11-05 NOTE — Telephone Encounter (Signed)
Sent bupropion to the pharmacy for the patient

## 2019-11-05 NOTE — Telephone Encounter (Signed)
Not on patients med list please advise

## 2019-11-07 ENCOUNTER — Ambulatory Visit (INDEPENDENT_AMBULATORY_CARE_PROVIDER_SITE_OTHER): Payer: Medicare Other | Admitting: Family Medicine

## 2019-11-07 ENCOUNTER — Encounter: Payer: Self-pay | Admitting: Family Medicine

## 2019-11-07 ENCOUNTER — Other Ambulatory Visit: Payer: Self-pay

## 2019-11-07 VITALS — BP 144/79 | HR 96 | Temp 98.9°F | Ht 63.0 in | Wt 139.0 lb

## 2019-11-07 DIAGNOSIS — F419 Anxiety disorder, unspecified: Secondary | ICD-10-CM

## 2019-11-07 DIAGNOSIS — F32A Depression, unspecified: Secondary | ICD-10-CM

## 2019-11-07 DIAGNOSIS — F411 Generalized anxiety disorder: Secondary | ICD-10-CM | POA: Diagnosis not present

## 2019-11-07 DIAGNOSIS — F329 Major depressive disorder, single episode, unspecified: Secondary | ICD-10-CM | POA: Diagnosis not present

## 2019-11-07 MED ORDER — BUPROPION HCL ER (XL) 450 MG PO TB24: 450.0000 mg | ORAL_TABLET | Freq: Every day | ORAL | 3 refills | Status: DC

## 2019-11-07 NOTE — Progress Notes (Signed)
BP (!) 144/79   Pulse 96   Temp 98.9 F (37.2 C)   Ht 5\' 3"  (1.6 m)   Wt 139 lb (63 kg)   SpO2 98%   BMI 24.62 kg/m    Subjective:   Patient ID: Isabella Lindsey, female    DOB: 11/18/1945, 74 y.o.   MRN: [id number]  HPI: Isabella Lindsey is a 74 y.o. female presenting on 11/07/2019 for Medical Management of Chronic Issues and Depression (Would like to increase Wellbutrin)   HPI Anxiety and depression recheck Patient is coming in today for anxiety and depression recheck.  She feels like she has been doing well but would like to increase Wellbutrin and see how she is doing.  Patient denies any suicidal ideations or thoughts of hurting self but just says it has been more anxious and heightened anxiety recently. Depression screen [facility] 2/9 09/19/2019 03/28/2019 01/20/2019 09/26/2018 06/26/2018  Decreased Interest 0 0 0 2 0  Down, Depressed, Hopeless 1 0 0 2 2  PHQ - 2 Score 1 0 0 4 2  Altered sleeping - - - 2 2  Tired, decreased energy - - - 2 1  Change in appetite - - - 3 2  Feeling bad or failure about yourself  - - - 2 1  Trouble concentrating - - - 1 1  Moving slowly or fidgety/restless - - - 0 0  Suicidal thoughts - - - 0 0  PHQ-9 Score - - - 14 9     Relevant past medical, surgical, family and social history reviewed and updated as indicated. Interim medical history since our last visit reviewed. Allergies and medications reviewed and updated.  Review of Systems  Constitutional: Negative for chills and fever.  Eyes: Negative for visual disturbance.  Respiratory: Negative for chest tightness and shortness of breath.   Cardiovascular: Negative for chest pain and leg swelling.  Musculoskeletal: Negative for back pain and gait problem.  Skin: Negative for rash.  Neurological: Negative for light-headedness and headaches.  Psychiatric/Behavioral: Negative for agitation and behavioral problems.  All other systems reviewed and are negative.   Per HPI unless specifically  indicated above   Allergies as of 11/07/2019      Reactions   Effexor [venlafaxine]    Dizziness   Penicillins    Erythromycin Rash      Medication List       Accurate as of November 07, 2019  4:28 PM. If you have any questions, ask your nurse or doctor.        ALPRAZolam 0.25 MG tablet Commonly known as: XANAX Take 1 tablet (0.25 mg total) by mouth at bedtime as needed for anxiety.   buPROPion HCl ER (XL) 450 MG Tb24 Take 450 mg by mouth daily. What changed:   medication strength  how much to take Changed by: Fransisca Kaufmann Jillian Warth, MD   hydrochlorothiazide 12.5 MG capsule Commonly known as: MICROZIDE Take 1 capsule (12.5 mg total) by mouth daily.   levothyroxine 75 MCG tablet Commonly known as: SYNTHROID Take 1 tablet (75 mcg total) by mouth daily.   losartan 25 MG tablet Commonly known as: COZAAR Take 2 tablets (50 mg total) by mouth daily.   pantoprazole 20 MG tablet Commonly known as: PROTONIX Take 1 tablet (20 mg total) by mouth 2 (two) times daily before a meal. Take 1 po BID x 2 weeks then once a day   rosuvastatin 10 MG tablet Commonly known as: CRESTOR Take 1 tablet (10 mg  total) by mouth daily.        Objective:   BP (!) 144/79   Pulse 96   Temp 98.9 F (37.2 C)   Ht 5\' 3"  (1.6 m)   Wt 139 lb (63 kg)   SpO2 98%   BMI 24.62 kg/m   Wt Readings from Last 3 Encounters:  11/07/19 139 lb (63 kg)  09/19/19 141 lb 12.8 oz (64.3 kg)  03/28/19 142 lb 6.4 oz (64.6 kg)    Physical Exam Vitals and nursing note reviewed.  Constitutional:      General: She is not in acute distress.    Appearance: She is well-developed. She is not diaphoretic.  Eyes:     Conjunctiva/sclera: Conjunctivae normal.  Cardiovascular:     Rate and Rhythm: Normal rate and regular rhythm.     Heart sounds: Normal heart sounds. No murmur.  Pulmonary:     Effort: Pulmonary effort is normal. No respiratory distress.     Breath sounds: Normal breath sounds. No wheezing.    Musculoskeletal:        General: No tenderness. Normal range of motion.  Skin:    General: Skin is warm and dry.     Findings: No rash.  Neurological:     Mental Status: She is alert and oriented to person, place, and time.     Coordination: Coordination normal.  Psychiatric:        Mood and Affect: Mood is anxious and depressed.        Behavior: Behavior normal.        Thought Content: Thought content does not include suicidal ideation. Thought content does not include suicidal plan.       Assessment & Plan:   Problem List Items Addressed This Visit      Other   GAD (generalized anxiety disorder) - Primary   Relevant Medications   buPROPion 450 MG TB24      Increase Wellbutrin to 450 mg.  It looks like her blood pressure is doing fine so we will continue to monitor. Follow up plan: Return in about 4 months (around 03/08/2020), or if symptoms worsen or fail to improve, for Patient is already appointment in July and will see then..  Counseling provided for all of the vaccine components No orders of the defined types were placed in this encounter.   August, MD [facility] 11/07/2019, 4:28 PM

## 2019-11-10 ENCOUNTER — Telehealth: Payer: Self-pay | Admitting: Family Medicine

## 2019-11-10 NOTE — Telephone Encounter (Signed)
KeyJohny Blamer - PA Case ID: [id number] - Rx #: [id number]

## 2019-11-10 NOTE — Telephone Encounter (Signed)
  Medication Request  11/10/2019  What is the name of the medication? Welbutrin  Have you contacted your pharmacy to request a refill? Yes  Which pharmacy would you like this sent to? [facility]   Patient notified that their request is being sent to the clinical staff for review and that they should receive a call once it is complete. If they do not receive a call within 24 hours they can check with their pharmacy or our office.   Dettinger's pt.  Please call pt.  They increased her RX from 300 to 450 & [facility] in [place] does not have meds yet. Please call in bc she was just seen on this past Friday.

## 2019-11-10 NOTE — Telephone Encounter (Signed)
Bupropion needs a PA, this was sent through [facility]

## 2019-11-11 NOTE — Telephone Encounter (Signed)
I have added anxiety and depression as a diagnosis, let us go ahead and run it off of that.

## 2019-11-11 NOTE — Telephone Encounter (Signed)
This was denied by plan:   Bupropion hydro XL tab is not FDA approved for your medical condition: GAD  No alternative meds were suggested.

## 2019-11-12 ENCOUNTER — Telehealth: Payer: Self-pay | Admitting: *Deleted

## 2019-11-12 DIAGNOSIS — F419 Anxiety disorder, unspecified: Secondary | ICD-10-CM

## 2019-11-12 DIAGNOSIS — F411 Generalized anxiety disorder: Secondary | ICD-10-CM

## 2019-11-12 DIAGNOSIS — F32A Depression, unspecified: Secondary | ICD-10-CM

## 2019-11-12 DIAGNOSIS — F329 Major depressive disorder, single episode, unspecified: Secondary | ICD-10-CM

## 2019-11-12 NOTE — Telephone Encounter (Signed)
Prior Auth for BuPROPion 450mg  TB24- IN PROCESS     Key: - PA Case ID: [id number]

## 2019-11-13 ENCOUNTER — Telehealth: Payer: Self-pay | Admitting: Family Medicine

## 2019-11-13 MED ORDER — BUPROPION HCL ER (XL) 150 MG PO TB24: 150.0000 mg | ORAL_TABLET | Freq: Every day | ORAL | 3 refills | Status: DC

## 2019-11-13 MED ORDER — BUPROPION HCL ER (XL) 300 MG PO TB24: 300.0000 mg | ORAL_TABLET | Freq: Every day | ORAL | 3 refills | Status: DC

## 2019-11-13 NOTE — Addendum Note (Signed)
Addended by: Arville Care on: 11/13/2019 01:09 PM   Modules accepted: Orders

## 2019-11-13 NOTE — Telephone Encounter (Signed)
Pt called and aware

## 2019-11-13 NOTE — Telephone Encounter (Signed)
I sent it as 2 separate prescriptions of 150 mg tablets and 300 mg tablets, she can take both at the same time

## 2019-11-13 NOTE — Telephone Encounter (Addendum)
Fax from pt plan - states DENIED  For Bupropion 450mg  TB 24  No recommendations from plan given..  Please address what you would like to order for patient.

## 2019-11-15 ENCOUNTER — Ambulatory Visit: Payer: Medicare Other | Attending: Internal Medicine

## 2019-11-15 DIAGNOSIS — Z23 Encounter for immunization: Secondary | ICD-10-CM

## 2019-11-15 NOTE — Progress Notes (Signed)
   Covid-19 Vaccination Clinic  Name:  Isabella Lindsey    MRN: [id number] DOB: 07/03/1946  11/15/2019  [name] was observed post Covid-19 immunization for 30 minutes based on pre-vaccination screening without incident. She was provided with Vaccine Information Sheet and instruction to access the V-Safe system.   [name] was instructed to call 911 with any severe reactions post vaccine: [name] Difficulty breathing  . Swelling of face and throat  . A fast heartbeat  . A bad rash all over body  . Dizziness and weakness   Immunizations Administered    Name Date Dose VIS Date Route   Moderna COVID-19 Vaccine 11/15/2019 12:06 PM 0.5 mL 08/05/2019 Intramuscular   Manufacturer: Moderna   Lot: [id number]   NDC: 31674-255-25

## 2019-12-01 ENCOUNTER — Ambulatory Visit: Payer: Medicare Other | Admitting: Orthopaedic Surgery

## 2019-12-02 ENCOUNTER — Telehealth: Payer: Self-pay | Admitting: Family Medicine

## 2019-12-02 NOTE — Chronic Care Management (AMB) (Signed)
  Chronic Care Management   Outreach Note  12/02/2019 Name: Isabella Lindsey MRN: [id number] DOB: [birth year]  Isabella Lindsey is a 74 y.o. year old female who is a primary care patient of Dettinger, Elige Radon, MD. I reached out to Isabella Lindsey by phone today in response to a referral sent by Isabella Lindsey's health plan.     An unsuccessful telephone outreach was attempted today. The patient was referred to the case management team for assistance with care management and care coordination.   Follow Up Plan: A HIPPA compliant phone message was left for the patient providing contact information and requesting a return call. The care management team will reach out to the patient again over the next 7 days. If patient returns call to provider office, please advise to call Embedded Care Management Care Guide Gwenevere Ghazi at [phone].  Gwenevere Ghazi  Care Guide, Embedded Care Coordination [facility]  [place], [place] [address] Direct Dial: [phone] [email or web address] Website: [email or web address]

## 2019-12-08 NOTE — Chronic Care Management (AMB) (Signed)
  Chronic Care Management   Note  12/08/2019 Name: Isabella Lindsey MRN: [id number] DOB: 03/03/1946  Isabella Lindsey is a 74 y.o. year old female who is a primary care patient of Dettinger, Fransisca Kaufmann, MD. I reached out to Erven Colla by phone today in response to a referral sent by Isabella Lindsey health plan.     Isabella Lindsey was given information about Chronic Care Management services today including:  1. CCM service includes personalized support from designated clinical staff supervised by her physician, including individualized plan of care and coordination with other care providers 2. 24/7 contact phone numbers for assistance for urgent and routine care needs. 3. Service will only be billed when office clinical staff spend 20 minutes or more in a month to coordinate care. 4. Only one practitioner may furnish and bill the service in a calendar month. 5. The patient may stop CCM services at any time (effective at the end of the month) by phone call to the office staff. 6. The patient will be responsible for cost sharing (co-pay) of up to 20% of the service fee (after annual deductible is met).  Patient did not agree to enrollment in care management services and does not wish to consider at this time.  Follow up plan: The patient has been provided with contact information for the care management team and has been advised to call with any health related questions or concerns.   [place], [place] [address] Direct Dial: [phone] [email or web address] Website: [email or web address]

## 2019-12-12 ENCOUNTER — Telehealth: Payer: Self-pay | Admitting: Family Medicine

## 2019-12-12 NOTE — Telephone Encounter (Signed)
Pt should go back to previous dose of Wellbutrin.

## 2019-12-15 NOTE — Telephone Encounter (Signed)
Aware of provider's advice. 

## 2019-12-17 ENCOUNTER — Telehealth: Payer: Self-pay | Admitting: Family Medicine

## 2019-12-17 DIAGNOSIS — F32A Depression, unspecified: Secondary | ICD-10-CM

## 2019-12-17 DIAGNOSIS — F329 Major depressive disorder, single episode, unspecified: Secondary | ICD-10-CM

## 2019-12-17 DIAGNOSIS — F411 Generalized anxiety disorder: Secondary | ICD-10-CM

## 2019-12-17 MED ORDER — BUPROPION HCL ER (XL) 150 MG PO TB24: 150.0000 mg | ORAL_TABLET | Freq: Every day | ORAL | 3 refills | Status: DC

## 2019-12-17 NOTE — Telephone Encounter (Signed)
Aware of recommendations and refill has been sent to pharmacy

## 2019-12-17 NOTE — Telephone Encounter (Signed)
We can lower it to Wellbutrin 150 mg daily and send her prescription for that or she can make an appointment to discuss other possible treatment options to help with anxiety.

## 2019-12-17 NOTE — Telephone Encounter (Signed)
Pt wanted to let Dr Dettinger know that she is still shakey even with the lower dose of Wellbutrin, 300mg . Needs advice.

## 2019-12-29 ENCOUNTER — Ambulatory Visit (INDEPENDENT_AMBULATORY_CARE_PROVIDER_SITE_OTHER): Payer: Medicare Other | Admitting: Pharmacist

## 2019-12-29 ENCOUNTER — Other Ambulatory Visit: Payer: Self-pay

## 2019-12-29 VITALS — BP 124/73 | HR 80 | Wt 139.0 lb

## 2019-12-29 DIAGNOSIS — F329 Major depressive disorder, single episode, unspecified: Secondary | ICD-10-CM

## 2019-12-29 DIAGNOSIS — F32A Depression, unspecified: Secondary | ICD-10-CM

## 2019-12-29 DIAGNOSIS — F419 Anxiety disorder, unspecified: Secondary | ICD-10-CM | POA: Diagnosis not present

## 2019-12-29 MED ORDER — ESCITALOPRAM OXALATE 10 MG PO TABS: 10.0000 mg | ORAL_TABLET | Freq: Every day | ORAL | 3 refills | Status: DC

## 2019-12-29 NOTE — Progress Notes (Signed)
   Pharmacy Clinic  12/29/2019 Name: Isabella Lindsey MRN: [id number] DOB: [birth year]  Referred by: Dettinger, Elige Radon, MD Reason for referral : Depression   Anxiety and Depression recheck  Patient is coming in today to discuss medications for anxiety and depression. She feels like she has been "shaking" more with increase and start of Wellbutrin.   Resting hand tremor noted when patient reached out her hand.  This could be the dopamine response that patient is experiencing.  Patient has been on Prozac and Effexor in the past and does not wish to retrial.   Patient denies any suicidal ideations or thoughts of hurting self.  She is more anxious than she is depressed, but does struggle with both.  She will be traveling to [place] for her grandson's graduation.  Traveling during this time can be stressful and heighten anxiety.  Past PHQ9s Depression screen [facility] 2/9 09/19/2019 03/28/2019 01/20/2019 09/26/2018 06/26/2018  Decreased Interest 0 0 0 2 0  Down, Depressed, Hopeless 1 0 0 2 2  PHQ - 2 Score 1 0 0 4 2  Altered sleeping - - - 2 2   Current Outpatient Medications on File Prior to Visit  Medication Sig Dispense Refill  . ALPRAZolam (XANAX) 0.25 MG tablet Take 1 tablet (0.25 mg total) by mouth at bedtime as needed for anxiety. 30 tablet 5  . buPROPion (WELLBUTRIN XL) 150 MG 24 hr tablet Take 1 tablet (150 mg total) by mouth daily. Take a total of 450 mg (Patient taking differently: Take 150 mg by mouth daily. ) 90 tablet 3  . hydrochlorothiazide (MICROZIDE) 12.5 MG capsule Take 1 capsule (12.5 mg total) by mouth daily. 90 capsule 3  . levothyroxine (SYNTHROID) 75 MCG tablet Take 1 tablet (75 mcg total) by mouth daily. 90 tablet 3  . losartan (COZAAR) 25 MG tablet Take 2 tablets (50 mg total) by mouth daily. 180 tablet 3  . pantoprazole (PROTONIX) 20 MG tablet Take 1 tablet (20 mg total) by mouth 2 (two) times daily before a meal. Take 1 po BID x 2 weeks then once a day 180 tablet 3  .  rosuvastatin (CRESTOR) 10 MG tablet Take 1 tablet (10 mg total) by mouth daily. 90 tablet 3   No current facility-administered medications on file prior to visit.     Patient has weaned herself down to Wellbutrin 150mg  XL daily due to the "shaking" and unsteady hand.  Discussed case with , FNP who is in agreement to cross taper with Lexapro.    PLAN:  Recommend starting Lexapro 5mg  daily.  Continue Wellbutrin 150mg  xl for 2 weeks (can then take every other day for 1-2 weeks), then increase Lexapro to 10mg  as needed  Agressive cross taper not needed due to patient self-wean  Will f/u with patient in 2 weeks    Jannifer Rodney, PharmD, BCPS Clinical Pharmacist, [facility]  II Phone [phone]

## 2020-01-05 ENCOUNTER — Ambulatory Visit (INDEPENDENT_AMBULATORY_CARE_PROVIDER_SITE_OTHER): Payer: Medicare Other | Admitting: Pharmacist

## 2020-01-05 DIAGNOSIS — F32A Depression, unspecified: Secondary | ICD-10-CM

## 2020-01-05 DIAGNOSIS — F419 Anxiety disorder, unspecified: Secondary | ICD-10-CM

## 2020-01-05 DIAGNOSIS — F329 Major depressive disorder, single episode, unspecified: Secondary | ICD-10-CM | POA: Diagnosis not present

## 2020-01-05 NOTE — Progress Notes (Signed)
  Pharmacy Clinic Follow Up  01/05/2020 Name: Isabella Lindsey MRN: [id number] DOB: Dec 31, 1945  Anxiety and Depression recheck  55 yoF follow up for recent medication changes for anxiety/depression.  Medications were changed at last visit in April 2021.  She states she doesn't feel better, but she doesn't feel worse.  The "shaking" in her hands still remains, but will hopefully improve as Wellbutrin is stopped in the upcoming weeks  Of note, patient has been on Prozac and Effexor in the past and does not wish to retrial.  Patient denies any suicidal ideations or thoughts of hurting self.  She is more anxious than she is depressed, but struggles with both. She will be traveling to [place] in 2 weeks.  Traveling during this time can be stressful and heighten anxiety.  Patient has weaned herself down to Wellbutrin 150mg  XL daily due to "shaking" and unsteady hands.  Discussed case with , FNP who is in agreement to continue cross taper with Lexapro.  Patient states she doesn't "feel better" since we last spoke, but she realizes it takes time to take effect.  Expect "shakiness" from Welbutrin to subside once discontinued.  She is currently taking Lexapro 5mg  daily with Wellbutrin 150mg  XL daily.  Past PHQ9s Depression screen [facility] 2/9 09/19/2019 03/28/2019 01/20/2019 09/26/2018 06/26/2018  Decreased Interest 0 0 0 2 0  Down, Depressed, Hopeless 1 0 0 2 2  PHQ - 2 Score 1 0 0 4 2  Altered sleeping - - - 2 2    PLAN:  Recommend increase to Lexapro 10mg  daily.  Start taking Wellbutrin 150mg  XL every other day for 2-3 weeks then stop  Extensive cross taper not needed due to patient self-wean of Wellbutrin XL 150mg  over the past few months   Discussed hydroxyzine PRN for additional anxiety coverage if needed.  She also has PRN Xanax 0.25mg  qHS if needed. Patient states she will give new medication additional time before starting another medication  Will f/u with patient in 2 weeks or  sooner if needed.  Encouraged patient to call in needs arise.  Encouraged patient to continue taking medication as prescribed.  Will continue to follow and adjust pharmacotherapy as needed.    01/22/2019, PharmD, BCPS Clinical Pharmacist, [facility]  II Phone [phone]

## 2020-02-11 ENCOUNTER — Ambulatory Visit (INDEPENDENT_AMBULATORY_CARE_PROVIDER_SITE_OTHER): Payer: Medicare Other

## 2020-02-11 DIAGNOSIS — Z0000 Encounter for general adult medical examination without abnormal findings: Secondary | ICD-10-CM

## 2020-02-11 NOTE — Progress Notes (Signed)
MEDICARE ANNUAL WELLNESS VISIT  02/11/2020  Telephone Visit Disclaimer This Medicare AWV was conducted by telephone due to national recommendations for restrictions regarding the COVID-19 Pandemic (e.g. social distancing).  I verified, using two identifiers, that I am speaking with Isabella Lindsey or their authorized healthcare agent. I discussed the limitations, risks, security, and privacy concerns of performing an evaluation and management service by telephone and the potential availability of an in-person appointment in the future. The patient expressed understanding and agreed to proceed.   Subjective:  Isabella Lindsey is a 74 y.o. female patient of Dettinger, Fransisca Kaufmann, MD who had a Medicare Annual Wellness Visit today via telephone. Isabella Lindsey is Retired and lives with their spouse. she has two living children.One daughter passed away years ago in a car accident. she reports that she is socially active and does interact with friends/family regularly. she is minimally physically active and enjoys sewing.  Patient Care Team: Dettinger, Fransisca Kaufmann, MD as PCP - General (Family Medicine) Lavera Guise, [facility] (Pharmacist)  Advanced Directives 02/11/2020 01/20/2019  Does Patient Have a Medical Advance Directive? Yes Yes  Type of Paramedic of [place];Living will [place];Living will  Does patient want to make changes to medical advance directive? No - Patient declined No - Patient declined  Copy of [place] in Chart? - No - copy requested    Hospital Utilization Over the Past 12 Months: # of hospitalizations or ER visits: 0 # of surgeries: 0  Review of Systems    Patient reports that her overall health is unchanged compared to last year.    Patient Reported Readings (BP, Pulse, CBG, Weight, etc) none  Pain Assessment Pain : No/denies pain     Current Medications & Allergies (verified) Allergies as of 02/11/2020       Reactions   Effexor [venlafaxine]    Dizziness   Penicillins    Erythromycin Rash      Medication List       Accurate as of February 11, 2020 10:36 AM. If you have any questions, ask your nurse or doctor.        ALPRAZolam 0.25 MG tablet Commonly known as: XANAX Take 1 tablet (0.25 mg total) by mouth at bedtime as needed for anxiety.   buPROPion 150 MG 24 hr tablet Commonly known as: Wellbutrin XL Take 1 tablet (150 mg total) by mouth daily. Take a total of 450 mg What changed:   when to take this  additional instructions   escitalopram 10 MG tablet Commonly known as: LEXAPRO Take 1 tablet (10 mg total) by mouth daily.   hydrochlorothiazide 12.5 MG capsule Commonly known as: MICROZIDE Take 1 capsule (12.5 mg total) by mouth daily.   levothyroxine 75 MCG tablet Commonly known as: SYNTHROID Take 1 tablet (75 mcg total) by mouth daily.   losartan 25 MG tablet Commonly known as: COZAAR Take 2 tablets (50 mg total) by mouth daily.   pantoprazole 20 MG tablet Commonly known as: PROTONIX Take 1 tablet (20 mg total) by mouth 2 (two) times daily before a meal. Take 1 po BID x 2 weeks then once a day   rosuvastatin 10 MG tablet Commonly known as: CRESTOR Take 1 tablet (10 mg total) by mouth daily.       History (reviewed): Past Medical History:  Diagnosis Date  . Abnormal EKG   . Allergic rhinitis   . Depression   . DJD (degenerative joint  disease)    cervical and Rt. knee, Rt. shoulder rotator cuff tendonitis  . Dyspepsia   . H/O esophageal reflux    w/hiatal hernia  . 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 Hypertension   . Insomnia    Occassional  . Migraine   . Seborrheic keratoses    Scattered seborrheic keratoses   Past Surgical History:  Procedure Laterality Date  . ABDOMINAL HYSTERECTOMY  1986   Family History  Problem Relation Age of Onset  . Cancer Father        lung  .  Arthritis Sister   . Breast cancer Neg Hx    Social History   Socioeconomic History  . Marital status: Married    Spouse name: Isabella Lindsey  . Number of children: 3  . Years of education: Not on file  . Highest education level: 12th grade  Occupational History  . Occupation: Retired  Tobacco Use  . Smoking status: Never Smoker  . Smokeless tobacco: Never Used  Substance and Sexual Activity  . Alcohol use: Never  . Drug use: Never  . Sexual activity: Not Currently  Other Topics Concern  . Not on file  Social History Narrative  . Not on file   Social Determinants of Health   Financial Resource Strain:   . Difficulty of Paying Living Expenses:   Food Insecurity:   . Worried About Programme researcher, broadcasting/film/video in the Last Year:   . Barista in the Last Year:   Transportation Needs:   . Freight forwarder (Medical):   [name] Lack of Transportation (Non-Medical):   Physical Activity:   . Days of Exercise per Week:   . Minutes of Exercise per Session:   Stress:   . Feeling of Stress :   Social Connections:   . Frequency of Communication with Friends and Family:   . Frequency of Social Gatherings with Friends and Family:   . Attends Religious Services:   . Active Member of Clubs or Organizations:   . Attends Banker Meetings:   [name] Marital Status:     Activities of Daily Living In your present state of health, do you have any difficulty performing the following activities: 02/11/2020  Hearing? Y  Vision? N  Difficulty concentrating or making decisions? N  Walking or climbing stairs? N  Dressing or bathing? N  Doing errands, shopping? N  Preparing Food and eating ? N  Using the Toilet? N  In the past six months, have you accidently leaked urine? N  Do you have problems with loss of bowel control? N  Managing your Medications? N  Managing your Finances? N  Housekeeping or managing your Housekeeping? N  Some recent data might be hidden    Patient Education/  Literacy How often do you need to have someone help you when you read instructions, pamphlets, or other written materials from your doctor or pharmacy?: 1 - Never What is the last grade level you completed in school?: 12th grade  Exercise Current Exercise Habits: Home exercise routine, Type of exercise: strength training/weights;stretching;walking, Time (Minutes): 30, Frequency (Times/Week): 5, Weekly Exercise (Minutes/Week): 150, Intensity: Mild  Diet Patient reports consuming 3 meals a day and 2 snack(s) a day Patient reports that her primary diet is: Regular Patient reports that she does have regular access to food.   Depression Screen PHQ 2/9 Scores 02/11/2020 11/07/2019 11/07/2019 09/19/2019 03/28/2019 01/20/2019 09/26/2018  PHQ -  2 Score 0 1 1 1  0 0 4  PHQ- 9 Score - 3 - - - - 14     Fall Risk Fall Risk  02/11/2020 11/07/2019 09/19/2019 03/28/2019 01/20/2019  Falls in the past year? 0 0 0 0 0     Objective:  01/22/2019 seemed alert and oriented and she participated appropriately during our telephone visit.  Blood Pressure Weight BMI  BP Readings from Last 3 Encounters:  01/05/20 124/73  11/07/19 (!) 144/79  09/19/19 (!) 163/78   Wt Readings from Last 3 Encounters:  01/05/20 139 lb (63 kg)  11/07/19 139 lb (63 kg)  09/19/19 141 lb 12.8 oz (64.3 kg)   BMI Readings from Last 1 Encounters:  01/05/20 24.62 kg/m    *Unable to obtain current vital signs, weight, and BMI due to telephone visit type  Hearing/Vision  . Isabella Lindsey did  seem to have difficulty with hearing/understanding during the telephone conversation . Reports that she has had a formal eye exam by an eye care professional within the past year . Reports that she has not had a formal hearing evaluation within the past year *Unable to fully assess hearing and vision during telephone visit type  Cognitive Function: 6CIT Screen 02/11/2020 01/20/2019  What Year? 0 points 0 points  What month? 0 points 0 points  What time? 0  points 0 points  Count back from 20 0 points 0 points  Months in reverse 0 points 0 points  Repeat phrase 0 points 0 points  Total Score 0 0   (Normal:0-7, Significant for Dysfunction: >8)  Normal Cognitive Function Screening: Yes   Immunization & Health Maintenance Record Immunization History  Administered Date(s) Administered  . Fluad Quad(high Dose 65+) 06/03/2019  . Influenza, High Dose Seasonal PF 06/13/2017, 06/26/2018  . Moderna SARS-COVID-2 Vaccination 10/18/2019, 11/15/2019  . Pneumococcal Conjugate-13 08/24/2014  . Pneumococcal Polysaccharide-23 08/10/2011, 09/19/2019    Health Maintenance  Topic Date Due  . DEXA SCAN  09/18/2020 (Originally 08/25/2011)  . TETANUS/TDAP  11/06/2020 (Originally 08/24/1965)  . INFLUENZA VACCINE  04/04/2020  . MAMMOGRAM  08/20/2020  . Fecal DNA (Cologuard)  05/04/2021  . COVID-19 Vaccine  Completed  . Hepatitis C Screening  Completed  . PNA vac Low Risk Adult  Completed       Assessment  This is a routine wellness examination for JOCI DRESS.  Health Maintenance: Due or Overdue There are no preventive care reminders to display for this patient.  Isabella Lindsey does not need a referral for Community Assistance: Care Management:   no Social Work:    no Prescription Assistance:  no Nutrition/Diabetes Education:  no   Plan:  Personalized Goals  Fall Prevention  Continue exercising   Personalized Health Maintenance & Screening Recommendations    Lung Cancer Screening Recommended: no (Low Dose CT Chest recommended if Age 68-80 years, 30 pack-year currently smoking OR have quit w/in past 15 years) Hepatitis C Screening recommended: no HIV Screening recommended: no  Advanced Directives: Written information was not prepared per patient's request.  Referrals & Orders No orders of the defined types were placed in this encounter.   Follow-up Plan . Follow-up with Dettinger, Isabella Robertson, MD as planned . Schedule  03/18/2020    I have personally reviewed and noted the following in the patient's chart:   . Medical and social history . Use of alcohol, tobacco or illicit drugs  . Current medications and supplements . Functional ability and status . Nutritional status .  Physical activity . Advanced directives . List of other physicians . Hospitalizations, surgeries, and ER visits in previous 12 months . Vitals . Screenings to include cognitive, depression, and falls . Referrals and appointments  In addition, I have reviewed and discussed with Isabella Lindsey certain preventive protocols, quality metrics, and best practice recommendations. A written personalized care plan for preventive services as well as general preventive health recommendations is available and can be mailed to the patient at her request.      Suzan Slick [facility]  09/09/1094

## 2020-02-11 NOTE — Patient Instructions (Addendum)
  MEDICARE ANNUAL WELLNESS VISIT Health Maintenance Summary and Written Plan of Care  [name] ,  Thank you for allowing me to perform your Medicare Annual Wellness Visit and for your ongoing commitment to your health.   Health Maintenance & Immunization History Health Maintenance  Topic Date Due  . DEXA SCAN  09/18/2020 (Originally 08/25/2011)  . TETANUS/TDAP  11/06/2020 (Originally 08/24/1965)  . INFLUENZA VACCINE  04/04/2020  . MAMMOGRAM  08/20/2020  . Fecal DNA (Cologuard)  05/04/2021  . COVID-19 Vaccine  Completed  . Hepatitis C Screening  Completed  . PNA vac Low Risk Adult  Completed   Immunization History  Administered Date(s) Administered  . Fluad Quad(high Dose 65+) 06/03/2019  . Influenza, High Dose Seasonal PF 06/13/2017, 06/26/2018  . Moderna SARS-COVID-2 Vaccination 10/18/2019, 11/15/2019  . Pneumococcal Conjugate-13 08/24/2014  . Pneumococcal Polysaccharide-23 08/10/2011, 09/19/2019    These are the patient goals that we discussed:  Continue exercising  Fall prevention   This is a list of Health Maintenance Items that are overdue or due now: There are no preventive care reminders to display for this patient.   Orders/Referrals Placed Today: No orders of the defined types were placed in this encounter.  (Contact our referral department at [phone] if you have not spoken with someone about your referral appointment within the next 5 days)    Follow-up Plan  Scheduled with Dr. Louanne Skye 03/18/2020 at 9:10am.

## 2020-03-18 ENCOUNTER — Other Ambulatory Visit: Payer: Self-pay

## 2020-03-18 ENCOUNTER — Encounter: Payer: Self-pay | Admitting: Family Medicine

## 2020-03-18 ENCOUNTER — Ambulatory Visit (INDEPENDENT_AMBULATORY_CARE_PROVIDER_SITE_OTHER): Payer: Medicare Other | Admitting: Family Medicine

## 2020-03-18 VITALS — BP 138/66 | HR 71 | Temp 97.0°F | Ht 63.0 in | Wt 144.0 lb

## 2020-03-18 DIAGNOSIS — E782 Mixed hyperlipidemia: Secondary | ICD-10-CM | POA: Diagnosis not present

## 2020-03-18 DIAGNOSIS — K219 Gastro-esophageal reflux disease without esophagitis: Secondary | ICD-10-CM

## 2020-03-18 DIAGNOSIS — E039 Hypothyroidism, unspecified: Secondary | ICD-10-CM | POA: Diagnosis not present

## 2020-03-18 DIAGNOSIS — I10 Essential (primary) hypertension: Secondary | ICD-10-CM | POA: Diagnosis not present

## 2020-03-18 DIAGNOSIS — F411 Generalized anxiety disorder: Secondary | ICD-10-CM

## 2020-03-18 MED ORDER — ALPRAZOLAM 0.25 MG PO TABS: 0.2500 mg | ORAL_TABLET | Freq: Every evening | ORAL | 5 refills | Status: DC | PRN

## 2020-03-18 MED ORDER — ESCITALOPRAM OXALATE 20 MG PO TABS: 20.0000 mg | ORAL_TABLET | Freq: Every day | ORAL | 3 refills | Status: DC

## 2020-03-18 MED ORDER — LOSARTAN POTASSIUM 25 MG PO TABS: 12.5000 mg | ORAL_TABLET | Freq: Every day | ORAL | 3 refills | Status: DC

## 2020-03-18 NOTE — Progress Notes (Signed)
BP 138/66   Pulse 71   Temp (!) 97 F (36.1 C)   Ht 5' 3"  (1.6 m)   Wt 144 lb (65.3 kg)   SpO2 100%   BMI 25.51 kg/m    Subjective:   Patient ID: Isabella Lindsey, female    DOB: 1945/10/22, 74 y.o.   MRN: [id number]  HPI: Isabella Lindsey is a 74 y.o. female presenting on 03/18/2020 for Medical Management of Chronic Issues (20m, Hypertension, Hyperlipidemia, and Anxiety   HPI Anxiety Current rx-alprazolam as needed and Lexapro, tried Wellbutrin but did not do well with it. # meds rx-alprazolam 0.25 mg nightly as needed, she does not use it every night but does use it most nights.  30 Effectiveness of current meds-works well Adverse reactions form meds-none  Pill count performed-No Last drug screen -09/25/2019 ( high risk q340mmoderate risk q6m68mow risk yearly ) Urine drug screen today- No Was the NCCAlf[facility]iewed-yes  If yes were their any concerning findings? -None, she is not filling it every month  No flowsheet data found.   Controlled substance contract signed on: 1/27 2021  Hypothyroidism recheck Patient is coming in for thyroid recheck today as well. They deny any issues with hair changes or heat or cold problems or diarrhea or constipation. They deny any chest pain or palpitations. They are currently on levothyroxine 75 micrograms   Hypertension Patient is currently on hydrochlorothiazide 12.5 mg and losartan 25 mg, and their blood pressure today is 138/66.  Patient has some lightheadedness and dizziness at times but not all the time and she is concerned because her bottom number is running lower and she has lost some weight. Patient denies headaches, blurred vision, chest pains, shortness of breath, or weakness. Denies any side effects from medication and is content with current medication.   Hyperlipidemia Patient is coming in for recheck of his hyperlipidemia. The patient is currently taking Crestor. They deny any issues with myalgias or history of liver damage  from it. They deny any focal numbness or weakness or chest pain.   GERD Patient is currently on no medication currently has been doing well.  She denies any major symptoms or abdominal pain or belching or burping. She denies any blood in her stool or lightheadedness or dizziness.   Relevant past medical, surgical, family and social history reviewed and updated as indicated. Interim medical history since our last visit reviewed. Allergies and medications reviewed and updated.  Review of Systems  Constitutional: Negative for chills and fever.  Eyes: Negative for redness and visual disturbance.  Respiratory: Negative for chest tightness and shortness of breath.   Cardiovascular: Negative for chest pain and leg swelling.  Genitourinary: Negative for difficulty urinating and dysuria.  Musculoskeletal: Negative for back pain and gait problem.  Skin: Negative for rash.  Neurological: Negative for light-headedness and headaches.  Psychiatric/Behavioral: Negative for agitation, behavioral problems, self-injury, sleep disturbance and suicidal ideas. The patient is nervous/anxious.   All other systems reviewed and are negative.   Per HPI unless specifically indicated above   Allergies as of 03/18/2020      Reactions   Effexor [venlafaxine]    Dizziness   Penicillins    Erythromycin Rash      Medication List       Accurate as of March 18, 2020  9:24 AM. If you have any questions, ask your nurse or doctor.        STOP taking these medications   buPROPion 150 MG  24 hr tablet Commonly known as: Wellbutrin XL Stopped by: Fransisca Kaufmann Olivea Sonnen, MD   pantoprazole 20 MG tablet Commonly known as: [place] by: Worthy Rancher, MD     TAKE these medications   ALPRAZolam 0.25 MG tablet Commonly known as: XANAX Take 1 tablet (0.25 mg total) by mouth at bedtime as needed for anxiety. What changed: when to take this   escitalopram 10 MG tablet Commonly known as: LEXAPRO Take 1  tablet (10 mg total) by mouth daily.   hydrochlorothiazide 12.5 MG capsule Commonly known as: MICROZIDE Take 1 capsule (12.5 mg total) by mouth daily.   levothyroxine 75 MCG tablet Commonly known as: SYNTHROID Take 1 tablet (75 mcg total) by mouth daily.   losartan 25 MG tablet Commonly known as: COZAAR Take 2 tablets (50 mg total) by mouth daily.   rosuvastatin 10 MG tablet Commonly known as: CRESTOR Take 1 tablet (10 mg total) by mouth daily.        Objective:   BP 138/66   Pulse 71   Temp (!) 97 F (36.1 C)   Ht 5' 3"  (1.6 m)   Wt 144 lb (65.3 kg)   SpO2 100%   BMI 25.51 kg/m   Wt Readings from Last 3 Encounters:  03/18/20 144 lb (65.3 kg)  01/05/20 139 lb (63 kg)  11/07/19 139 lb (63 kg)    Physical Exam Vitals and nursing note reviewed.  Constitutional:      General: She is not in acute distress.    Appearance: She is well-developed. She is not diaphoretic.  Eyes:     Conjunctiva/sclera: Conjunctivae normal.  Cardiovascular:     Rate and Rhythm: Normal rate and regular rhythm.     Heart sounds: Normal heart sounds. No murmur heard.   Pulmonary:     Effort: Pulmonary effort is normal. No respiratory distress.     Breath sounds: Normal breath sounds. No wheezing.  Musculoskeletal:        General: No tenderness. Normal range of motion.  Skin:    General: Skin is warm and dry.     Findings: No rash.  Neurological:     Mental Status: She is alert and oriented to person, place, and time.     Coordination: Coordination normal.  Psychiatric:        Behavior: Behavior normal.       Assessment & Plan:   Problem List Items Addressed This Visit      Cardiovascular and Mediastinum   Hypertension   Relevant Medications   losartan (COZAAR) 25 MG tablet   Other Relevant Orders   CMP14+EGFR     Digestive   GERD (gastroesophageal reflux disease)   Relevant Orders   CBC with Differential/Platelet     Endocrine   Hypothyroidism - Primary    Relevant Orders   TSH     Other   Hyperlipidemia   Relevant Medications   losartan (COZAAR) 25 MG tablet   Other Relevant Orders   Lipid panel   GAD (generalized anxiety disorder)   Relevant Medications   escitalopram (LEXAPRO) 20 MG tablet   ALPRAZolam (XANAX) 0.25 MG tablet   Other Relevant Orders   CBC with Differential/Platelet   TSH      Increased patient's Lexapro and will refill the alprazolam. Follow up plan: Return in about 6 months (around 09/18/2020), or if symptoms worsen or fail to improve, for Hypothyroidism and anxiety and hyperlipidemia and hypertension.  Counseling provided for all of the vaccine  components No orders of the defined types were placed in this encounter.   Caryl Pina, MD [facility] 03/18/2020, 9:24 AM

## 2020-03-19 ENCOUNTER — Other Ambulatory Visit: Payer: Self-pay | Admitting: Family Medicine

## 2020-03-19 DIAGNOSIS — Z1231 Encounter for screening mammogram for malignant neoplasm of breast: Secondary | ICD-10-CM

## 2020-03-19 LAB — LIPID PANEL
Chol/HDL Ratio: 2.6 ratio (ref 0.0–4.4)
Cholesterol, Total: 157 mg/dL (ref 100–199)
HDL: 60 mg/dL (ref >39)
LDL Chol Calc (NIH): 78 mg/dL (ref 0–99)
Triglycerides: 106 mg/dL (ref 0–149)
VLDL Cholesterol Cal: 19 mg/dL (ref 5–40)

## 2020-03-19 LAB — CMP14+EGFR
ALT: 10 IU/L (ref 0–32)
AST: 16 IU/L (ref 0–40)
Albumin/Globulin Ratio: 1.9 (ref 1.2–2.2)
Albumin: 4.1 g/dL (ref 3.7–4.7)
Alkaline Phosphatase: 63 IU/L (ref 48–121)
BUN/Creatinine Ratio: 22 (ref 12–28)
BUN: 27 mg/dL (ref 8–27)
Bilirubin Total: 0.4 mg/dL (ref 0.0–1.2)
CO2: 26 mmol/L (ref 20–29)
Calcium: 9.4 mg/dL (ref 8.7–10.3)
Chloride: 103 mmol/L (ref 96–106)
Creatinine, Ser: 1.24 mg/dL — ABNORMAL HIGH (ref 0.57–1.00)
GFR calc Af Amer: 50 mL/min/1.73 — ABNORMAL LOW
GFR calc non Af Amer: 43 mL/min/1.73 — ABNORMAL LOW
Globulin, Total: 2.2 g/dL (ref 1.5–4.5)
Glucose: 93 mg/dL (ref 65–99)
Potassium: 3.7 mmol/L (ref 3.5–5.2)
Sodium: 142 mmol/L (ref 134–144)
Total Protein: 6.3 g/dL (ref 6.0–8.5)

## 2020-03-19 LAB — CBC WITH DIFFERENTIAL/PLATELET
Basophils Absolute: 0.1 10*3/uL (ref 0.0–0.2)
Basos: 2 %
EOS (ABSOLUTE): 0.3 10*3/uL (ref 0.0–0.4)
Eos: 4 %
Hematocrit: 35.8 % (ref 34.0–46.6)
Hemoglobin: 11.6 g/dL (ref 11.1–15.9)
Immature Grans (Abs): 0 10*3/uL (ref 0.0–0.1)
Immature Granulocytes: 0 %
Lymphocytes Absolute: 2.7 10*3/uL (ref 0.7–3.1)
Lymphs: 41 %
MCH: 28.4 pg (ref 26.6–33.0)
MCHC: 32.4 g/dL (ref 31.5–35.7)
MCV: 88 fL (ref 79–97)
Monocytes Absolute: 0.6 10*3/uL (ref 0.1–0.9)
Monocytes: 10 %
Neutrophils Absolute: 2.9 10*3/uL (ref 1.4–7.0)
Neutrophils: 43 %
Platelets: 271 10*3/uL (ref 150–450)
RBC: 4.08 x10E6/uL (ref 3.77–5.28)
RDW: 13.5 % (ref 11.7–15.4)
WBC: 6.7 10*3/uL (ref 3.4–10.8)

## 2020-03-19 LAB — TSH: TSH: 0.946 u[IU]/mL (ref 0.450–4.500)

## 2020-03-30 ENCOUNTER — Other Ambulatory Visit: Payer: Self-pay | Admitting: Family Medicine

## 2020-04-28 ENCOUNTER — Other Ambulatory Visit: Payer: Self-pay

## 2020-04-28 ENCOUNTER — Ambulatory Visit
Admission: RE | Admit: 2020-04-28 | Discharge: 2020-04-28 | Disposition: A | Discharge status: Home / Self Care | Payer: Medicare Other | Source: Ambulatory Visit | Attending: Family Medicine | Admitting: Family Medicine

## 2020-04-28 DIAGNOSIS — Z1231 Encounter for screening mammogram for malignant neoplasm of breast: Secondary | ICD-10-CM

## 2020-05-20 ENCOUNTER — Telehealth: Payer: Self-pay | Admitting: Family Medicine

## 2020-05-21 ENCOUNTER — Telehealth: Payer: Self-pay | Admitting: Family Medicine

## 2020-05-21 NOTE — Telephone Encounter (Signed)
Reports she has tried to come off of xanax 0.25mg  nightly--patient is not sleeping  Would continue with low dose xanax for sleep only AS NEEDED  Patient verbalizes understanding  All medications that work in the CNS have the potential to cause memory loss, however patient is on a low dose only as needed.

## 2020-05-21 NOTE — Telephone Encounter (Signed)
See duplicate note. 

## 2020-09-17 ENCOUNTER — Encounter: Payer: Self-pay | Admitting: Family Medicine

## 2020-09-17 ENCOUNTER — Ambulatory Visit (INDEPENDENT_AMBULATORY_CARE_PROVIDER_SITE_OTHER): Payer: Medicare Other | Admitting: Family Medicine

## 2020-09-17 ENCOUNTER — Other Ambulatory Visit: Payer: Self-pay

## 2020-09-17 VITALS — BP 133/66 | HR 75 | Ht 63.0 in | Wt 161.0 lb

## 2020-09-17 DIAGNOSIS — E782 Mixed hyperlipidemia: Secondary | ICD-10-CM | POA: Diagnosis not present

## 2020-09-17 DIAGNOSIS — I10 Essential (primary) hypertension: Secondary | ICD-10-CM

## 2020-09-17 DIAGNOSIS — F411 Generalized anxiety disorder: Secondary | ICD-10-CM

## 2020-09-17 DIAGNOSIS — E039 Hypothyroidism, unspecified: Secondary | ICD-10-CM

## 2020-09-17 DIAGNOSIS — Z79891 Long term (current) use of opiate analgesic: Secondary | ICD-10-CM | POA: Diagnosis not present

## 2020-09-17 MED ORDER — HYDROCHLOROTHIAZIDE 12.5 MG PO CAPS: 12.5000 mg | ORAL_CAPSULE | Freq: Every day | ORAL | 3 refills | Status: DC

## 2020-09-17 MED ORDER — PANTOPRAZOLE SODIUM 40 MG PO TBEC: 40.0000 mg | DELAYED_RELEASE_TABLET | Freq: Every day | ORAL | 3 refills | Status: DC

## 2020-09-17 MED ORDER — LOSARTAN POTASSIUM 25 MG PO TABS: 12.5000 mg | ORAL_TABLET | Freq: Every day | ORAL | 3 refills | Status: DC

## 2020-09-17 MED ORDER — ROSUVASTATIN CALCIUM 10 MG PO TABS: 10.0000 mg | ORAL_TABLET | Freq: Every day | ORAL | 3 refills | Status: DC

## 2020-09-17 MED ORDER — ESCITALOPRAM OXALATE 20 MG PO TABS: 20.0000 mg | ORAL_TABLET | Freq: Every day | ORAL | 3 refills | Status: DC

## 2020-09-17 MED ORDER — LEVOTHYROXINE SODIUM 75 MCG PO TABS: 75.0000 ug | ORAL_TABLET | Freq: Every day | ORAL | 3 refills | Status: DC

## 2020-09-17 NOTE — Addendum Note (Signed)
Addended by: Dorene Sorrow on: 09/17/2020 01:43 PM   Modules accepted: Orders

## 2020-09-17 NOTE — Progress Notes (Signed)
BP 133/66   Pulse 75   Ht 5' 3"  (1.6 m)   Wt 161 lb (73 kg)   SpO2 100%   BMI 28.52 kg/m    Subjective:   Patient ID: Isabella Lindsey, female    DOB: Oct 22, 1945, 75 y.o.   MRN: [id number]  HPI: Isabella Lindsey is a 75 y.o. female presenting on 09/17/2020 for Medical Management of Chronic Issues, Hypertension, and Hypothyroidism   HPI Hypothyroidism recheck Patient is coming in for thyroid recheck today as well. They deny any issues with hair changes or heat or cold problems or diarrhea or constipation. They deny any chest pain or palpitations. They are currently on levothyroxine 8mcrograms.  Patient is complaining of a lot more fatigue.  She says she just does not have as much energy.  She denies any significant snoring or stopping breathing at night.  She denies any depression or feeling of anxiety.  She thinks it may be her thyroid and she feels like it is off.  We will check the levels today.  Hypertension Patient is currently on losartan and hydrochlorothiazide, and their blood pressure today is 133/60. Patient denies any lightheadedness or dizziness. Patient denies headaches, blurred vision, chest pains, shortness of breath, or weakness. Denies any side effects from medication and is content with current medication.   Hyperlipidemia Patient is coming in for recheck of his hyperlipidemia. The patient is currently taking Crestor. They deny any issues with myalgias or history of liver damage from it. They deny any focal numbness or weakness or chest pain.   Patient is coming in for recheck for anxiety.  She currently uses the alprazolam occasionally in the evenings and has refills for that.  She also uses Lexapro as well.  Relevant past medical, surgical, family and social history reviewed and updated as indicated. Interim medical history since our last visit reviewed. Allergies and medications reviewed and updated.  Review of Systems  Constitutional: Positive for fatigue.  Negative for chills, fever and unexpected weight change.  HENT: Negative for congestion, ear discharge and ear pain.   Eyes: Negative for redness and visual disturbance.  Respiratory: Negative for chest tightness and shortness of breath.   Cardiovascular: Negative for chest pain and leg swelling.  Musculoskeletal: Negative for back pain and gait problem.  Skin: Negative for rash.  Neurological: Negative for light-headedness and headaches.  Psychiatric/Behavioral: Negative for agitation and behavioral problems.  All other systems reviewed and are negative.   Per HPI unless specifically indicated above   Allergies as of 09/17/2020      Reactions   Effexor [venlafaxine]    Dizziness   Penicillins    Erythromycin Rash      Medication List       Accurate as of September 17, 2020 11:07 AM. If you have any questions, ask your nurse or doctor.        ALPRAZolam 0.25 MG tablet Commonly known as: XANAX Take 1 tablet (0.25 mg total) by mouth at bedtime as needed for anxiety.   escitalopram 20 MG tablet Commonly known as: LEXAPRO Take 1 tablet (20 mg total) by mouth daily.   hydrochlorothiazide 12.5 MG capsule Commonly known as: MICROZIDE Take 1 capsule (12.5 mg total) by mouth daily.   levothyroxine 75 MCG tablet Commonly known as: SYNTHROID Take 1 tablet by mouth once daily   losartan 25 MG tablet Commonly known as: COZAAR Take 0.5 tablets (12.5 mg total) by mouth daily.   pantoprazole 40 MG tablet Commonly known  as: PROTONIX Take 40 mg by mouth daily.   rosuvastatin 10 MG tablet Commonly known as: CRESTOR Take 1 tablet (10 mg total) by mouth daily.        Objective:   BP 133/66   Pulse 75   Ht 5' 3"  (1.6 m)   Wt 161 lb (73 kg)   SpO2 100%   BMI 28.52 kg/m   Wt Readings from Last 3 Encounters:  09/17/20 161 lb (73 kg)  03/18/20 144 lb (65.3 kg)  01/05/20 139 lb (63 kg)    Physical Exam Vitals and nursing note reviewed.  Constitutional:      General:  She is not in acute distress.    Appearance: She is well-developed and well-nourished. She is not diaphoretic.  Eyes:     Extraocular Movements: EOM normal.     Conjunctiva/sclera: Conjunctivae normal.  Cardiovascular:     Rate and Rhythm: Normal rate and regular rhythm.     Pulses: Intact distal pulses.     Heart sounds: Normal heart sounds. No murmur heard.   Pulmonary:     Effort: Pulmonary effort is normal. No respiratory distress.     Breath sounds: Normal breath sounds. No wheezing.  Musculoskeletal:        General: No tenderness or edema. Normal range of motion.  Skin:    General: Skin is warm and dry.     Findings: No rash.  Neurological:     Mental Status: She is alert and oriented to person, place, and time.     Coordination: Coordination normal.  Psychiatric:        Mood and Affect: Mood and affect normal.        Behavior: Behavior normal.       Assessment & Plan:   Problem List Items Addressed This Visit      Cardiovascular and Mediastinum   Hypertension   Relevant Medications   hydrochlorothiazide (MICROZIDE) 12.5 MG capsule   losartan (COZAAR) 25 MG tablet   rosuvastatin (CRESTOR) 10 MG tablet   Other Relevant Orders   CBC with Differential/Platelet   CMP14+EGFR     Endocrine   Hypothyroidism - Primary   Relevant Medications   levothyroxine (SYNTHROID) 75 MCG tablet   Other Relevant Orders   CBC with Differential/Platelet   TSH     Other   Hyperlipidemia   Relevant Medications   hydrochlorothiazide (MICROZIDE) 12.5 MG capsule   losartan (COZAAR) 25 MG tablet   rosuvastatin (CRESTOR) 10 MG tablet   Other Relevant Orders   Lipid panel      Continue current medication and check labs. Follow up plan: Return in about 5 months (around 02/15/2021), or if symptoms worsen or fail to improve, for Recheck thyroid and hypertension.  Counseling provided for all of the vaccine components No orders of the defined types were placed in this  encounter.   Caryl Pina, MD [facility] 09/17/2020, 11:07 AM

## 2020-09-18 LAB — CMP14+EGFR
ALT: 9 IU/L (ref 0–32)
AST: 17 IU/L (ref 0–40)
Albumin/Globulin Ratio: 1.6 (ref 1.2–2.2)
Albumin: 4.2 g/dL (ref 3.7–4.7)
Alkaline Phosphatase: 67 IU/L (ref 44–121)
BUN/Creatinine Ratio: 19 (ref 12–28)
BUN: 24 mg/dL (ref 8–27)
Bilirubin Total: 0.4 mg/dL (ref 0.0–1.2)
CO2: 25 mmol/L (ref 20–29)
Calcium: 9.8 mg/dL (ref 8.7–10.3)
Chloride: 100 mmol/L (ref 96–106)
Creatinine, Ser: 1.28 mg/dL — ABNORMAL HIGH (ref 0.57–1.00)
GFR calc Af Amer: 48 mL/min/{1.73_m2} — ABNORMAL LOW (ref >59)
GFR calc non Af Amer: 41 mL/min/{1.73_m2} — ABNORMAL LOW (ref >59)
Globulin, Total: 2.7 g/dL (ref 1.5–4.5)
Glucose: 100 mg/dL — ABNORMAL HIGH (ref 65–99)
Potassium: 3.7 mmol/L (ref 3.5–5.2)
Sodium: 139 mmol/L (ref 134–144)
Total Protein: 6.9 g/dL (ref 6.0–8.5)

## 2020-09-18 LAB — CBC WITH DIFFERENTIAL/PLATELET
Basophils Absolute: 0.1 10*3/uL (ref 0.0–0.2)
Basos: 1 %
EOS (ABSOLUTE): 0.1 10*3/uL (ref 0.0–0.4)
Eos: 1 %
Hematocrit: 36.5 % (ref 34.0–46.6)
Hemoglobin: 12.1 g/dL (ref 11.1–15.9)
Immature Grans (Abs): 0 10*3/uL (ref 0.0–0.1)
Immature Granulocytes: 0 %
Lymphocytes Absolute: 2 10*3/uL (ref 0.7–3.1)
Lymphs: 16 %
MCH: 27.8 pg (ref 26.6–33.0)
MCHC: 33.2 g/dL (ref 31.5–35.7)
MCV: 84 fL (ref 79–97)
Monocytes Absolute: 0.7 10*3/uL (ref 0.1–0.9)
Monocytes: 5 %
Neutrophils Absolute: 10 10*3/uL — ABNORMAL HIGH (ref 1.4–7.0)
Neutrophils: 77 %
Platelets: 323 10*3/uL (ref 150–450)
RBC: 4.35 x10E6/uL (ref 3.77–5.28)
RDW: 13.3 % (ref 11.7–15.4)
WBC: 12.9 10*3/uL — ABNORMAL HIGH (ref 3.4–10.8)

## 2020-09-18 LAB — LIPID PANEL
Chol/HDL Ratio: 2.9 ratio (ref 0.0–4.4)
Cholesterol, Total: 159 mg/dL (ref 100–199)
HDL: 55 mg/dL (ref >39)
LDL Chol Calc (NIH): 72 mg/dL (ref 0–99)
Triglycerides: 192 mg/dL — ABNORMAL HIGH (ref 0–149)
VLDL Cholesterol Cal: 32 mg/dL (ref 5–40)

## 2020-09-18 LAB — TSH: TSH: 1.93 u[IU]/mL (ref 0.450–4.500)

## 2020-09-23 ENCOUNTER — Telehealth: Payer: Self-pay

## 2020-09-23 MED ORDER — TERBINAFINE HCL 250 MG PO TABS: 250.0000 mg | ORAL_TABLET | Freq: Every day | ORAL | 1 refills | Status: DC

## 2020-09-23 NOTE — Telephone Encounter (Signed)
I sent terbinafine for her now

## 2020-09-24 LAB — TOXASSURE SELECT 13 (MW), URINE

## 2020-10-04 DIAGNOSIS — M170 Bilateral primary osteoarthritis of knee: Secondary | ICD-10-CM | POA: Diagnosis not present

## 2020-10-11 DIAGNOSIS — M170 Bilateral primary osteoarthritis of knee: Secondary | ICD-10-CM | POA: Diagnosis not present

## 2020-10-13 ENCOUNTER — Other Ambulatory Visit: Payer: Self-pay

## 2020-10-13 ENCOUNTER — Encounter: Payer: Self-pay | Admitting: Nurse Practitioner

## 2020-10-13 ENCOUNTER — Ambulatory Visit (INDEPENDENT_AMBULATORY_CARE_PROVIDER_SITE_OTHER): Payer: Medicare Other | Admitting: Nurse Practitioner

## 2020-10-13 DIAGNOSIS — R300 Dysuria: Secondary | ICD-10-CM | POA: Insufficient documentation

## 2020-10-13 LAB — URINALYSIS
Bilirubin, UA: NEGATIVE
Glucose, UA: NEGATIVE
Ketones, UA: NEGATIVE
Nitrite, UA: POSITIVE — AB
Specific Gravity, UA: 1.015 (ref 1.005–1.030)
Urobilinogen, Ur: 0.2 mg/dL (ref 0.2–1.0)
pH, UA: 6 (ref 5.0–7.5)

## 2020-10-13 MED ORDER — SULFAMETHOXAZOLE-TRIMETHOPRIM 800-160 MG PO TABS: 1.0000 | ORAL_TABLET | Freq: Two times a day (BID) | ORAL | 0 refills | Status: DC

## 2020-10-13 NOTE — Assessment & Plan Note (Addendum)
UTI symptoms not well controlled.  Patient is reporting urgency, cloudy urine, and pelvic pain and pressure.  Patient rates pain 6 out of 10 on the pain scale of 0-10.  Urinalysis and culture obtained results pending.  Will reevaluate treatment which urine cultures completed.  Education provided to increase hydration, patient verbalized understanding. Rx sent to pharmacy. Follow-up with worsening or unresolved symptoms.

## 2020-10-13 NOTE — Progress Notes (Signed)
   Virtual Visit via telephone Note Due to COVID-19 pandemic this visit was conducted virtually. This visit type was conducted due to national recommendations for restrictions regarding the COVID-19 Pandemic (e.g. social distancing, sheltering in place) in an effort to limit this patient's exposure and mitigate transmission in our community. All issues noted in this document were discussed and addressed.  A physical exam was not performed with this format.  I connected with Isabella Lindsey on 10/13/20 at  10:48 AM by telephone and verified that I am speaking with the correct person using two identifiers. Isabella Lindsey is currently located at home during visit. The provider, Daryll Drown, NP is located in their office at time of visit.  I discussed the limitations, risks, security and privacy concerns of performing an evaluation and management service by telephone and the availability of in person appointments. I also discussed with the patient that there may be a patient responsible charge related to this service. The patient expressed understanding and agreed to proceed.   History and Present Illness:  Dysuria  This is a new problem. The current episode started in the past 7 days. The problem has been unchanged. The quality of the pain is described as aching. The pain is at a severity of 6/10. The pain is moderate. There has been no fever. Pertinent negatives include no chills, flank pain or nausea. Associated symptoms comments: Cloudy urine. She has tried nothing for the symptoms. There is no history of catheterization, kidney stones or a urological procedure.      Review of Systems  Constitutional: Negative for chills.  Gastrointestinal: Negative for nausea.  Genitourinary: Positive for dysuria. Negative for flank pain.     Observations/Objective: Televisit-patient did not sound to be in distress.  Assessment and Plan:  Dysuria UTI symptoms not well controlled.  Patient is  reporting urgency, cloudy urine, and pelvic pain and pressure.  Patient rates pain 6 out of 10 on the pain scale of 0-10.  Urinalysis and culture obtained results pending.  Will reevaluate treatment which urine cultures completed.  Education provided to increase hydration, patient verbalized understanding. Rx sent to pharmacy. Follow-up with worsening or unresolved symptoms.     Follow Up Instructions: Follow-up with worsening unresolved symptoms    I discussed the assessment and treatment plan with the patient. The patient was provided an opportunity to ask questions and all were answered. The patient agreed with the plan and demonstrated an understanding of the instructions.   The patient was advised to call back or seek an in-person evaluation if the symptoms worsen or if the condition fails to improve as anticipated.  The above assessment and management plan was discussed with the patient. The patient verbalized understanding of and has agreed to the management plan. Patient is aware to call the clinic if symptoms persist or worsen. Patient is aware when to return to the clinic for a follow-up visit. Patient educated on when it is appropriate to go to the emergency department.   Time call ended: 10:58 AM  I provided 10 minutes of non-face-to-face time during this encounter.    Daryll Drown, NP

## 2020-10-14 ENCOUNTER — Other Ambulatory Visit: Payer: Self-pay | Admitting: Family Medicine

## 2020-10-14 DIAGNOSIS — F411 Generalized anxiety disorder: Secondary | ICD-10-CM

## 2020-10-16 LAB — URINE CULTURE

## 2020-10-18 ENCOUNTER — Ambulatory Visit: Payer: Medicare Other | Admitting: Nurse Practitioner

## 2020-10-18 DIAGNOSIS — M170 Bilateral primary osteoarthritis of knee: Secondary | ICD-10-CM | POA: Diagnosis not present

## 2020-10-19 ENCOUNTER — Telehealth: Payer: Self-pay

## 2020-10-19 NOTE — Telephone Encounter (Signed)
  Prescription Request  10/19/2020  What is the name of the medication or equipment? ALPRAZolam (XANAX) 0.25 MG tablet   Have you contacted your pharmacy to request a refill? (if applicable) yes  Which pharmacy would you like this sent to? [facility], pt was seen on 09/17/20 by Dr. Algis Downs. For med refills, pt did not bring in that bottle because she still had some pills left but recently ran out would like refill, she also signed Controlled Sub, Agreement at that appt   Patient notified that their request is being sent to the clinical staff for review and that they should receive a response within 2 business days.

## 2020-10-20 ENCOUNTER — Other Ambulatory Visit: Payer: Self-pay | Admitting: Family Medicine

## 2020-10-20 DIAGNOSIS — F411 Generalized anxiety disorder: Secondary | ICD-10-CM

## 2020-10-20 NOTE — Progress Notes (Signed)
Spoke with pharmacy tech the xanax Rx did have refills, but they expired.

## 2020-10-20 NOTE — Progress Notes (Unsigned)
According to the files, the patient had a prescription from July that she should still have 4 refills left, please contact the pharmacy and see what the status is of this but it looks like she should have 4 refills left according to my records  Sent refills Arville Care, MD [facility] 10/21/2020, 9:28 AM

## 2020-10-21 MED ORDER — ALPRAZOLAM 0.25 MG PO TABS: 0.2500 mg | ORAL_TABLET | Freq: Every evening | ORAL | 5 refills | Status: DC | PRN

## 2020-10-21 NOTE — Progress Notes (Signed)
Patient aware.

## 2020-10-26 DIAGNOSIS — M170 Bilateral primary osteoarthritis of knee: Secondary | ICD-10-CM | POA: Diagnosis not present

## 2020-11-25 ENCOUNTER — Ambulatory Visit (INDEPENDENT_AMBULATORY_CARE_PROVIDER_SITE_OTHER): Payer: Medicare Other | Admitting: Family Medicine

## 2020-11-25 ENCOUNTER — Encounter: Payer: Self-pay | Admitting: Family Medicine

## 2020-11-25 ENCOUNTER — Other Ambulatory Visit: Payer: Self-pay

## 2020-11-25 VITALS — BP 126/74 | HR 79 | Ht 63.0 in | Wt 161.0 lb

## 2020-11-25 DIAGNOSIS — R1013 Epigastric pain: Secondary | ICD-10-CM

## 2020-11-25 DIAGNOSIS — R1011 Right upper quadrant pain: Secondary | ICD-10-CM

## 2020-11-25 MED ORDER — PANTOPRAZOLE SODIUM 40 MG PO TBEC: 40.0000 mg | DELAYED_RELEASE_TABLET | Freq: Two times a day (BID) | ORAL | 3 refills | Status: DC

## 2020-11-25 NOTE — Progress Notes (Signed)
BP 126/74   Pulse 79   Ht 5' 3" (1.6 m)   Wt 161 lb (73 kg)   SpO2 97%   BMI 28.52 kg/m    Subjective:   Patient ID: Isabella Lindsey, female    DOB: July 12, 1946, 75 y.o.   MRN: [id number]  HPI: Isabella Lindsey is a 75 y.o. female presenting on 11/25/2020 for Back Pain (Mid back pain) and Nausea   HPI Patient is coming in today with back pain and abdominal pain.  She says she will start with this nausea and upper abdominal pain that then goes around to her back on both sides in her mid back and she will get sweats and it was all hit her.  She says it happens usually in the evenings, it often happens when she is trying to move around and do stuff around the house but it usually happens after she eats in the evenings as well.  She says sometimes is been getting more severe and worse.  She says is been going on for quite some time but at least over the past month or 2 it has been worsening and happening more frequently.  She will feel a little short of breath during these episodes and felt clammy and sweaty and nauseous during these episodes.  She is taking the pantoprazole every other day.  Relevant past medical, surgical, family and social history reviewed and updated as indicated. Interim medical history since our last visit reviewed. Allergies and medications reviewed and updated.  Review of Systems  Constitutional: Negative for chills and fever.  Eyes: Negative for visual disturbance.  Respiratory: Negative for chest tightness and shortness of breath.   Cardiovascular: Negative for chest pain and leg swelling.  Gastrointestinal: Positive for abdominal pain and nausea. Negative for abdominal distention, diarrhea and vomiting.  Genitourinary: Negative for difficulty urinating, dysuria, flank pain, frequency, menstrual problem and urgency.  Musculoskeletal: Negative for back pain and gait problem.  Skin: Negative for rash.  Neurological: Negative for light-headedness and headaches.   Psychiatric/Behavioral: Negative for agitation and behavioral problems.  All other systems reviewed and are negative.   Per HPI unless specifically indicated above   Allergies as of 11/25/2020      Reactions   Effexor [venlafaxine]    Dizziness   Penicillins    Erythromycin Rash      Medication List       Accurate as of November 25, 2020  4:34 PM. If you have any questions, ask your nurse or doctor.        ALPRAZolam 0.25 MG tablet Commonly known as: XANAX Take 1 tablet (0.25 mg total) by mouth at bedtime as needed for anxiety.   escitalopram 20 MG tablet Commonly known as: LEXAPRO Take 1 tablet (20 mg total) by mouth daily.   hydrochlorothiazide 12.5 MG capsule Commonly known as: MICROZIDE Take 1 capsule (12.5 mg total) by mouth daily.   levothyroxine 75 MCG tablet Commonly known as: SYNTHROID Take 1 tablet (75 mcg total) by mouth daily.   losartan 25 MG tablet Commonly known as: COZAAR Take 0.5 tablets (12.5 mg total) by mouth daily.   pantoprazole 40 MG tablet Commonly known as: PROTONIX Take 1 tablet (40 mg total) by mouth 2 (two) times daily before a meal. What changed: when to take this Changed by: Worthy Rancher, MD   rosuvastatin 10 MG tablet Commonly known as: CRESTOR Take 1 tablet (10 mg total) by mouth daily.   sulfamethoxazole-trimethoprim 800-160 MG tablet  Commonly known as: Bactrim DS Take 1 tablet by mouth 2 (two) times daily.   terbinafine 250 MG tablet Commonly known as: LAMISIL Take 1 tablet (250 mg total) by mouth daily.        Objective:   BP 126/74   Pulse 79   Ht 5' 3" (1.6 m)   Wt 161 lb (73 kg)   SpO2 97%   BMI 28.52 kg/m   Wt Readings from Last 3 Encounters:  11/25/20 161 lb (73 kg)  09/17/20 161 lb (73 kg)  03/18/20 144 lb (65.3 kg)    Physical Exam Vitals and nursing note reviewed.  Constitutional:      General: She is not in acute distress.    Appearance: She is well-developed. She is not diaphoretic.   Eyes:     Conjunctiva/sclera: Conjunctivae normal.  Cardiovascular:     Rate and Rhythm: Normal rate and regular rhythm.     Heart sounds: Normal heart sounds. No murmur heard.   Pulmonary:     Effort: Pulmonary effort is normal. No respiratory distress.     Breath sounds: Normal breath sounds. No wheezing.  Abdominal:     Tenderness: There is abdominal tenderness in the right upper quadrant and epigastric area. There is no right CVA tenderness, left CVA tenderness, guarding or rebound. Negative signs include Murphy's sign, Rovsing's sign, McBurney's sign and psoas sign.  Musculoskeletal:        General: No tenderness. Normal range of motion.  Skin:    General: Skin is warm and dry.     Findings: No rash.  Neurological:     Mental Status: She is alert and oriented to person, place, and time.     Coordination: Coordination normal.  Psychiatric:        Behavior: Behavior normal.       Assessment & Plan:   Problem List Items Addressed This Visit   None   Visit Diagnoses    Epigastric abdominal pain    -  Primary   Relevant Medications   pantoprazole (PROTONIX) 40 MG tablet   Other Relevant Orders   US ABDOMEN LIMITED RUQ (LIVER/GB)   CBC with Differential/Platelet   CMP14+EGFR   Lipase   Right upper quadrant abdominal pain       Relevant Medications   pantoprazole (PROTONIX) 40 MG tablet   Other Relevant Orders   US ABDOMEN LIMITED RUQ (LIVER/GB)   CBC with Differential/Platelet   CMP14+EGFR   Lipase      Will check blood work including for pancreas and liver and send for right upper quadrant abdominal ultrasound, recommended to double her pantoprazole for now until we figure out what is going on. Follow up plan: Return if symptoms worsen or fail to improve.  Counseling provided for all of the vaccine components Orders Placed This Encounter  Procedures  . US ABDOMEN LIMITED RUQ (LIVER/GB)  . CBC with Differential/Platelet  . CMP14+EGFR  . Lipase     Caryl Pina, MD [facility] 11/25/2020, 4:34 PM

## 2020-11-26 ENCOUNTER — Ambulatory Visit (HOSPITAL_COMMUNITY)
Admission: RE | Admit: 2020-11-26 | Discharge: 2020-11-26 | Disposition: A | Discharge status: Home / Self Care | Payer: Medicare Other | Source: Ambulatory Visit | Attending: Family Medicine | Admitting: Family Medicine

## 2020-11-26 DIAGNOSIS — R1011 Right upper quadrant pain: Secondary | ICD-10-CM

## 2020-11-26 DIAGNOSIS — R1013 Epigastric pain: Secondary | ICD-10-CM | POA: Diagnosis not present

## 2020-11-26 LAB — CMP14+EGFR
ALT: 14 IU/L (ref 0–32)
AST: 23 IU/L (ref 0–40)
Albumin/Globulin Ratio: 1.4 (ref 1.2–2.2)
Albumin: 4 g/dL (ref 3.7–4.7)
Alkaline Phosphatase: 71 IU/L (ref 44–121)
BUN/Creatinine Ratio: 14 (ref 12–28)
BUN: 18 mg/dL (ref 8–27)
Bilirubin Total: 0.3 mg/dL (ref 0.0–1.2)
CO2: 23 mmol/L (ref 20–29)
Calcium: 9.5 mg/dL (ref 8.7–10.3)
Chloride: 101 mmol/L (ref 96–106)
Creatinine, Ser: 1.3 mg/dL — ABNORMAL HIGH (ref 0.57–1.00)
Globulin, Total: 2.8 g/dL (ref 1.5–4.5)
Glucose: 95 mg/dL (ref 65–99)
Potassium: 3.4 mmol/L — ABNORMAL LOW (ref 3.5–5.2)
Sodium: 142 mmol/L (ref 134–144)
Total Protein: 6.8 g/dL (ref 6.0–8.5)
eGFR: 43 mL/min/{1.73_m2} — ABNORMAL LOW (ref >59)

## 2020-11-26 LAB — CBC WITH DIFFERENTIAL/PLATELET
Basophils Absolute: 0 10*3/uL (ref 0.0–0.2)
Basos: 0 %
EOS (ABSOLUTE): 0.3 10*3/uL (ref 0.0–0.4)
Eos: 3 %
Hematocrit: 36.8 % (ref 34.0–46.6)
Hemoglobin: 12 g/dL (ref 11.1–15.9)
Immature Grans (Abs): 0 10*3/uL (ref 0.0–0.1)
Immature Granulocytes: 0 %
Lymphocytes Absolute: 2.7 10*3/uL (ref 0.7–3.1)
Lymphs: 27 %
MCH: 27.5 pg (ref 26.6–33.0)
MCHC: 32.6 g/dL (ref 31.5–35.7)
MCV: 84 fL (ref 79–97)
Monocytes Absolute: 0.6 10*3/uL (ref 0.1–0.9)
Monocytes: 6 %
Neutrophils Absolute: 6.5 10*3/uL (ref 1.4–7.0)
Neutrophils: 64 %
Platelets: 301 10*3/uL (ref 150–450)
RBC: 4.37 x10E6/uL (ref 3.77–5.28)
RDW: 14.3 % (ref 11.7–15.4)
WBC: 10.2 10*3/uL (ref 3.4–10.8)

## 2020-11-26 LAB — LIPASE: Lipase: 51 U/L (ref 14–85)

## 2020-12-08 ENCOUNTER — Telehealth: Payer: Self-pay

## 2020-12-08 DIAGNOSIS — K219 Gastro-esophageal reflux disease without esophagitis: Secondary | ICD-10-CM

## 2020-12-08 DIAGNOSIS — R1013 Epigastric pain: Secondary | ICD-10-CM

## 2020-12-08 NOTE — Telephone Encounter (Signed)
Taking Protonix BID.  Since not been eating has not been sick  Can be SOB with exertion   No gallstones seen on scan  Avoids sweets and spicy foods  Over the past two years it has gotten worse.  Ok for referral to [facility]

## 2020-12-09 NOTE — Telephone Encounter (Signed)
Placed referral for the patient. 

## 2020-12-14 ENCOUNTER — Encounter: Payer: Self-pay | Admitting: Internal Medicine

## 2021-01-04 DIAGNOSIS — M170 Bilateral primary osteoarthritis of knee: Secondary | ICD-10-CM | POA: Diagnosis not present

## 2021-01-06 ENCOUNTER — Telehealth: Payer: Self-pay

## 2021-01-06 ENCOUNTER — Encounter: Payer: Self-pay | Admitting: Gastroenterology

## 2021-01-06 NOTE — Telephone Encounter (Signed)
FYI: Patient called to let us know that she is going to be having knee replacement surgery done soon and we should be received surgery clearance paperwork soon.  Pt had last visit with Dr Dettinger on 11/25/20 and says she mentioned having knee surgery, but says if Dr Dettinger still needs to see her, then please call her back to schedule.

## 2021-01-06 NOTE — Telephone Encounter (Signed)
Appointment scheduled.

## 2021-01-06 NOTE — Telephone Encounter (Signed)
EKG is 75 years old, will need appt for new one

## 2021-01-06 NOTE — Telephone Encounter (Signed)
Would you like her to be seen for surgical clearance?

## 2021-01-14 ENCOUNTER — Other Ambulatory Visit: Payer: Self-pay

## 2021-01-14 ENCOUNTER — Ambulatory Visit (INDEPENDENT_AMBULATORY_CARE_PROVIDER_SITE_OTHER): Payer: Medicare Other | Admitting: Family Medicine

## 2021-01-14 ENCOUNTER — Encounter: Payer: Self-pay | Admitting: Family Medicine

## 2021-01-14 VITALS — BP 129/70 | HR 78 | Ht 63.0 in | Wt 159.0 lb

## 2021-01-14 DIAGNOSIS — R1011 Right upper quadrant pain: Secondary | ICD-10-CM | POA: Diagnosis not present

## 2021-01-14 DIAGNOSIS — Z01818 Encounter for other preprocedural examination: Secondary | ICD-10-CM

## 2021-01-14 DIAGNOSIS — K219 Gastro-esophageal reflux disease without esophagitis: Secondary | ICD-10-CM

## 2021-01-14 DIAGNOSIS — I4510 Unspecified right bundle-branch block: Secondary | ICD-10-CM

## 2021-01-14 NOTE — Progress Notes (Signed)
BP 129/70   Pulse 78   Ht 5\' 3"  (1.6 m)   Wt 159 lb (72.1 kg)   SpO2 99%   BMI 28.17 kg/m    Subjective:   Patient ID: , female    DOB: 13-Sep-1945, 75 y.o.   MRN: [id number]  HPI: Isabella Lindsey is a 75 y.o. female presenting on 01/14/2021 for Preoperative Clearance   HPI Patient is coming in today for preoperative clearance for a right knee replacement.  Patient says her only health issue right now is that she gets a little upset stomach in the morning with her stomach and she has GI but not able to do it yet.  States that she denies any other health issues.  She can walk over 2 blocks without getting shortness of breath.  She says the only thing that limits her is the pain in that knee. Patient denies any chest pain, shortness of breath, headaches or vision issues, abdominal complaints, diarrhea, nausea, vomiting, or joint issues.   Relevant past medical, surgical, family and social history reviewed and updated as indicated. Interim medical history since our last visit reviewed. Allergies and medications reviewed and updated.  Review of Systems  Constitutional: Negative for chills and fever.  Eyes: Negative for visual disturbance.  Respiratory: Negative for chest tightness and shortness of breath.   Cardiovascular: Negative for chest pain and leg swelling.  Genitourinary: Negative for difficulty urinating and dysuria.  Musculoskeletal: Negative for back pain and gait problem.  Skin: Negative for rash.  Neurological: Negative for light-headedness and headaches.  Psychiatric/Behavioral: Negative for agitation and behavioral problems.  All other systems reviewed and are negative.   Per HPI unless specifically indicated above   Allergies as of 01/14/2021      Reactions   Effexor [venlafaxine]    Dizziness   Penicillins    Erythromycin Rash      Medication List       Accurate as of Jan 14, 2021  4:04 PM. If you have any questions, ask your nurse or  doctor.        STOP taking these medications   sulfamethoxazole-trimethoprim 800-160 MG tablet Commonly known as: Bactrim DS Stopped by: Jan 16, 2021 Masson Nalepa, MD     TAKE these medications   ALPRAZolam 0.25 MG tablet Commonly known as: XANAX Take 1 tablet (0.25 mg total) by mouth at bedtime as needed for anxiety.   escitalopram 20 MG tablet Commonly known as: LEXAPRO Take 1 tablet (20 mg total) by mouth daily.   hydrochlorothiazide 12.5 MG capsule Commonly known as: MICROZIDE Take 1 capsule (12.5 mg total) by mouth daily.   levothyroxine 75 MCG tablet Commonly known as: SYNTHROID Take 1 tablet (75 mcg total) by mouth daily.   losartan 25 MG tablet Commonly known as: COZAAR Take 0.5 tablets (12.5 mg total) by mouth daily.   pantoprazole 40 MG tablet Commonly known as: PROTONIX Take 1 tablet (40 mg total) by mouth 2 (two) times daily before a meal.   rosuvastatin 10 MG tablet Commonly known as: CRESTOR Take 1 tablet (10 mg total) by mouth daily.   terbinafine 250 MG tablet Commonly known as: LAMISIL Take 1 tablet (250 mg total) by mouth daily.        Objective:   BP 129/70   Pulse 78   Ht 5\' 3"  (1.6 m)   Wt 159 lb (72.1 kg)   SpO2 99%   BMI 28.17 kg/m   Wt Readings from Last 3 Encounters:  01/14/21 159 lb (72.1 kg)  11/25/20 161 lb (73 kg)  09/17/20 161 lb (73 kg)    Physical Exam Vitals and nursing note reviewed.  Constitutional:      General: She is not in acute distress.    Appearance: She is well-developed. She is not diaphoretic.  Eyes:     Conjunctiva/sclera: Conjunctivae normal.  Cardiovascular:     Rate and Rhythm: Normal rate and regular rhythm.     Heart sounds: Normal heart sounds. No murmur heard.   Pulmonary:     Effort: Pulmonary effort is normal. No respiratory distress.     Breath sounds: Normal breath sounds. No wheezing.  Musculoskeletal:        General: No tenderness. Normal range of motion.  Skin:    General: Skin is warm  and dry.     Findings: No rash.  Neurological:     Mental Status: She is alert and oriented to person, place, and time.     Coordination: Coordination normal.  Psychiatric:        Behavior: Behavior normal.     EKG: New right bundle branch block for at least partial right bundle blanch block.  Sinus rhythm heart rate of 72 no acute ST-changes  Assessment & Plan:   Problem List Items Addressed This Visit   None   Visit Diagnoses    Preoperative clearance    -  Primary   Relevant Orders   EKG 12-Lead (Completed)   Ambulatory referral to Cardiology   New onset right bundle branch block (RBBB)       Relevant Orders   Ambulatory referral to Cardiology      Will refer to cardiology for cardiac clearance because of new right bundle branch, compared to previous EKG was not present. Follow up plan: Return if symptoms worsen or fail to improve.  Counseling provided for all of the vaccine components Orders Placed This Encounter  Procedures  . EKG 12-Lead    Arville Care, MD [facility] 01/14/2021, 4:04 PM

## 2021-01-21 ENCOUNTER — Telehealth: Payer: Self-pay | Admitting: Family Medicine

## 2021-01-21 NOTE — Telephone Encounter (Signed)
Pt has a question about surgical clearence. Please call back.

## 2021-01-21 NOTE — Telephone Encounter (Signed)
Patient reports ortho has told her she will not be under anesthesia when she has her knee replacement but will be given medication "that I will not know what is going on".  She is wondering if she still needs to see cardiologist before surgery.  I explained to patient that any type of medication that was given to her during the procedure could affect her heart and she would still need to see cardiologist to obtain surgical clearance.

## 2021-01-23 ENCOUNTER — Encounter: Payer: Self-pay | Admitting: Cardiology

## 2021-01-23 NOTE — Progress Notes (Signed)
Cardiology Office Note   Date:  01/24/2021   ID:  Isabella Lindsey, DOB Jun 19, 1946, MRN [id number]  PCP:  Dettinger, Elige Radon, MD  Cardiologist:   None Referring:  Dettinger, Elige Radon, MD  Chief Complaint  Patient presents with  . Abnormal ECG      History of Present Illness: Isabella Lindsey is a 75 y.o. female who presents for preop evaluation prior to knee surgery.  She has no past cardiac history.  2019 EKG was unremarkable.  She is noted now to have an incomplete right bundle branch block on her EKG.  There is a slightly prolonged QT interval.  However, she has had no symptoms consistent with bradycardia arrhythmias or presyncope or syncope.  She does some household activities.  She puts the vacuum.  She carries groceries.  She has some decreased exercise tolerance mildly.  There may be occasional shortness of breath.  However, she is not describing new dyspnea on exertion.  She has not had any new PND or orthopnea.  She has some rare palpitations but these have been longstanding and infrequent.  She is not describing substernal chest pressure, neck or jaw discomfort consistent with new or unstable angina.  She has had no prior cardiac testing.   Past Medical History:  Diagnosis Date  . Allergic rhinitis   . Depression   . DJD (degenerative joint disease)    cervical and Rt. knee, Rt. shoulder rotator cuff tendonitis  . Dyslipidemia   . Dyspepsia   . H/O esophageal reflux    w/hiatal hernia  . 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 Hypertension   . Insomnia    Occassional  . Migraine   . Seborrheic keratoses    Scattered seborrheic keratoses    Past Surgical History:  Procedure Laterality Date  . ABDOMINAL HYSTERECTOMY  1986     Current Outpatient Medications  Medication Sig Dispense Refill  . ALPRAZolam (XANAX) 0.25 MG tablet Take 1 tablet (0.25 mg total) by mouth at bedtime as needed  for anxiety. 30 tablet 5  . escitalopram (LEXAPRO) 20 MG tablet Take 1 tablet (20 mg total) by mouth daily. 90 tablet 3  . hydrochlorothiazide (MICROZIDE) 12.5 MG capsule Take 1 capsule (12.5 mg total) by mouth daily. 90 capsule 3  . levothyroxine (SYNTHROID) 75 MCG tablet Take 1 tablet (75 mcg total) by mouth daily. 90 tablet 3  . losartan (COZAAR) 25 MG tablet Take 0.5 tablets (12.5 mg total) by mouth daily. 45 tablet 3  . pantoprazole (PROTONIX) 40 MG tablet Take 40 mg by mouth daily.    . rosuvastatin (CRESTOR) 10 MG tablet Take 1 tablet (10 mg total) by mouth daily. 90 tablet 3  . terbinafine (LAMISIL) 250 MG tablet Take 1 tablet (250 mg total) by mouth daily. 90 tablet 1   No current facility-administered medications for this visit.    Allergies:   Effexor [venlafaxine], Penicillins, and Erythromycin    Social History:  The patient  reports that she has never smoked. She has never used smokeless tobacco. She reports that she does not drink alcohol and does not use drugs.   Family History:  The patient's family history includes Arthritis in her sister; Cancer in her father.    ROS:  Please see the history of present illness.   Otherwise, review of systems are positive for none.   All other systems are reviewed and negative.  PHYSICAL EXAM: VS:  BP 130/72   Pulse 70   Ht 5\' 4"  (1.626 m)   Wt 157 lb 11.2 oz (71.5 kg)   SpO2 97%   BMI 27.07 kg/m  , BMI Body mass index is 27.07 kg/m. GENERAL:  Well appearing HEENT:  Pupils equal round and reactive, fundi not visualized, oral mucosa unremarkable NECK:  No jugular venous distention, waveform within normal limits, carotid upstroke brisk and symmetric, no bruits, no thyromegaly LYMPHATICS:  No cervical, inguinal adenopathy LUNGS:  Clear to auscultation bilaterally BACK:  No CVA tenderness CHEST:  Unremarkable HEART:  PMI not displaced or sustained,S1 and S2 within normal limits, no S3, no S4, no clicks, no rubs, no murmurs ABD:   Flat, positive bowel sounds normal in frequency in pitch, no bruits, no rebound, no guarding, no midline pulsatile mass, no hepatomegaly, no splenomegaly EXT:  2 plus pulses throughout, no edema, no cyanosis no clubbing SKIN:  No rashes no nodules NEURO:  Cranial nerves II through XII grossly intact, motor grossly intact throughout PSYCH:  Cognitively intact, oriented to person place and time    EKG:  EKG is ordered today. The ekg ordered today demonstrates sinus rhythm, rate 70, incomplete right bundle branch block, QTC slightly prolonged, no acute ST-T wave changes.   Recent Labs: 09/17/2020: TSH 1.930 11/25/2020: ALT 14; BUN 18; Creatinine, Ser 1.30; Hemoglobin 12.0; Platelets 301; Potassium 3.4; Sodium 142    Lipid Panel    Component Value Date/Time   CHOL 159 09/17/2020 1134   TRIG 192 (H) 09/17/2020 1134   HDL 55 09/17/2020 1134   CHOLHDL 2.9 09/17/2020 1134   LDLCALC 72 09/17/2020 1134      Wt Readings from Last 3 Encounters:  01/24/21 157 lb 11.2 oz (71.5 kg)  01/14/21 159 lb (72.1 kg)  11/25/20 161 lb (73 kg)      Other studies Reviewed: Additional studies/ records that were reviewed today include: Labs and previous EKG. Review of the above records demonstrates:  Please see elsewhere in the note.     ASSESSMENT AND PLAN:  PREOP CLEARANCE:    The patient has no high risk physical or historical findings.  She is not going for a high risk procedure from a cardiovascular standpoint.  She is at high functional level.  41 ACC/AHA guidelines the patient is at acceptable risk for the planned surgery.  No further testing is suggested.  HTN: Her blood pressure is controlled.  No change in therapy.  ABNORMAL EKG: She has an incomplete right bundle branch block.  She however has no symptoms consistent with symptomatic bradycardia.  She also has mildly prolonged QT and should avoid further QT prolonging drugs.  However, no other testing is indicated.   Current medicines  are reviewed at length with the patient today.  The patient does not have concerns regarding medicines.  The following changes have been made:  no change  Labs/ tests ordered today include: None  Orders Placed This Encounter  Procedures  . EKG 12-Lead     Disposition:   FU with me as needed.   Signed, 11/27/20, MD  01/24/2021 11:25 AM    [facility]

## 2021-01-24 ENCOUNTER — Other Ambulatory Visit: Payer: Self-pay

## 2021-01-24 ENCOUNTER — Encounter: Payer: Self-pay | Admitting: Cardiology

## 2021-01-24 ENCOUNTER — Ambulatory Visit: Payer: Medicare Other | Admitting: Cardiology

## 2021-01-24 VITALS — BP 130/72 | HR 70 | Ht 64.0 in | Wt 157.7 lb

## 2021-01-24 DIAGNOSIS — Z01818 Encounter for other preprocedural examination: Secondary | ICD-10-CM | POA: Diagnosis not present

## 2021-01-24 DIAGNOSIS — R9431 Abnormal electrocardiogram [ECG] [EKG]: Secondary | ICD-10-CM | POA: Insufficient documentation

## 2021-01-24 DIAGNOSIS — I10 Essential (primary) hypertension: Secondary | ICD-10-CM | POA: Diagnosis not present

## 2021-01-24 NOTE — Patient Instructions (Signed)
Medication Instructions:  No changes  *If you need a refill on your cardiac medications before your next appointment, please call your pharmacy*   Lab Work:  Not needed   Testing/Procedures: Not needed   Follow-Up: At [facility], you and your health needs are our priority.  As part of our continuing mission to provide you with exceptional heart care, we have created designated Provider Care Teams.  These Care Teams include your primary Cardiologist (physician) and Advanced Practice Providers (APPs -  Physician Assistants and Nurse Practitioners) who all work together to provide you with the care you need, when you need it.     Your next appointment:     as needed  The format for your next appointment:   In Person  Provider:   Rollene Rotunda, MD   Other Instructions   you cleared from a cardiac standpoint to have knee surgery

## 2021-02-03 ENCOUNTER — Telehealth: Payer: Self-pay | Admitting: Family Medicine

## 2021-02-03 NOTE — Telephone Encounter (Signed)
Pt needs her surgical clearance sent to another office

## 2021-02-03 NOTE — Telephone Encounter (Signed)
Already seen you for this on 05/13 was already cleared by cardio

## 2021-02-04 NOTE — Telephone Encounter (Signed)
Isabella Lindsey filled out form and it has been faxed patient aware.

## 2021-02-04 NOTE — Telephone Encounter (Signed)
Pt needs her surgical clearance faxed asap

## 2021-02-04 NOTE — Telephone Encounter (Signed)
Waiting on PCP to reply. He is out of the office today

## 2021-02-07 NOTE — Telephone Encounter (Signed)
Re faxed patient aware conformation sent

## 2021-02-11 ENCOUNTER — Ambulatory Visit (INDEPENDENT_AMBULATORY_CARE_PROVIDER_SITE_OTHER): Payer: Medicare Other

## 2021-02-11 VITALS — Ht 64.0 in | Wt 157.0 lb

## 2021-02-11 DIAGNOSIS — Z0000 Encounter for general adult medical examination without abnormal findings: Secondary | ICD-10-CM | POA: Diagnosis not present

## 2021-02-11 NOTE — Patient Instructions (Signed)
[name] , Thank you for taking time to come for your Medicare Wellness Visit. I appreciate your ongoing commitment to your health goals. Please review the following plan we discussed and let me know if I can assist you in the future.   Screening recommendations/referrals: Colonoscopy: Cologuard done 05/04/2018 - Repeat every 3 years Mammogram: Done 04/28/2020 - Repeat annually Bone Density: Due (every 2 years) Recommended yearly ophthalmology/optometry visit for glaucoma screening and checkup Recommended yearly dental visit for hygiene and checkup  Vaccinations: Influenza vaccine: Done 06/18/2020 - Repeat annually Pneumococcal vaccine: Done 08/24/2014 & 09/19/2019 Tdap vaccine: Due (every 10 years) Shingles vaccine: Due Shingrix discussed. Please contact your pharmacy for coverage information.    Covid-19:Done 10/18/19, 11/15/19, & 07/07/2020  Advanced directives: Please bring a copy of your health care power of attorney and living will to the office to be added to your chart at your convenience.  Conditions/risks identified: Continue to stay active as tolerated. Do physical therapy exercises every day after knee surgery. Aim for 8 glasses of water daily, more fruits and vegetables, less sugar and fried foods.  Next appointment: Follow up in one year for your annual wellness visit    Preventive Care 65 Years and Older, Female Preventive care refers to lifestyle choices and visits with your health care provider that can promote health and wellness. What does preventive care include? A yearly physical exam. This is also called an annual well check. Dental exams once or twice a year. Routine eye exams. Ask your health care provider how often you should have your eyes checked. Personal lifestyle choices, including: Daily care of your teeth and gums. Regular physical activity. Eating a healthy diet. Avoiding tobacco and drug use. Limiting alcohol use. Practicing safe sex. Taking  low-dose aspirin every day. Taking vitamin and mineral supplements as recommended by your health care provider. What happens during an annual well check? The services and screenings done by your health care provider during your annual well check will depend on your age, overall health, lifestyle risk factors, and family history of disease. Counseling  Your health care provider may ask you questions about your: Alcohol use. Tobacco use. Drug use. Emotional well-being. Home and relationship well-being. Sexual activity. Eating habits. History of falls. Memory and ability to understand (cognition). Work and work Astronomer. Reproductive health. Screening  You may have the following tests or measurements: Height, weight, and BMI. Blood pressure. Lipid and cholesterol levels. These may be checked every 5 years, or more frequently if you are over 28 years old. Skin check. Lung cancer screening. You may have this screening every year starting at age 52 if you have a 30-pack-year history of smoking and currently smoke or have quit within the past 15 years. Fecal occult blood test (FOBT) of the stool. You may have this test every year starting at age 61. Flexible sigmoidoscopy or colonoscopy. You may have a sigmoidoscopy every 5 years or a colonoscopy every 10 years starting at age 71. Hepatitis C blood test. Hepatitis B blood test. Sexually transmitted disease (STD) testing. Diabetes screening. This is done by checking your blood sugar (glucose) after you have not eaten for a while (fasting). You may have this done every 1-3 years. Bone density scan. This is done to screen for osteoporosis. You may have this done starting at age 52. Mammogram. This may be done every 1-2 years. Talk to your health care provider about how often you should have regular mammograms. Talk with your health care provider about  your test results, treatment options, and if necessary, the need for more tests. Vaccines   Your health care provider may recommend certain vaccines, such as: Influenza vaccine. This is recommended every year. Tetanus, diphtheria, and acellular pertussis (Tdap, Td) vaccine. You may need a Td booster every 10 years. Zoster vaccine. You may need this after age 47. Pneumococcal 13-valent conjugate (PCV13) vaccine. One dose is recommended after age 53. Pneumococcal polysaccharide (PPSV23) vaccine. One dose is recommended after age 40. Talk to your health care provider about which screenings and vaccines you need and how often you need them. This information is not intended to replace advice given to you by your health care provider. Make sure you discuss any questions you have with your health care provider. Document Released: 09/17/2015 Document Revised: 05/10/2016 Document Reviewed: 06/22/2015 Elsevier Interactive Patient Education  2017 [employer].  Fall Prevention in the Home Falls can cause injuries. They can happen to people of all ages. There are many things you can do to make your home safe and to help prevent falls. What can I do on the outside of my home? Regularly fix the edges of walkways and driveways and fix any cracks. Remove anything that might make you trip as you walk through a door, such as a raised step or threshold. Trim any bushes or trees on the path to your home. Use bright outdoor lighting. Clear any walking paths of anything that might make someone trip, such as rocks or tools. Regularly check to see if handrails are loose or broken. Make sure that both sides of any steps have handrails. Any raised decks and porches should have guardrails on the edges. Have any leaves, snow, or ice cleared regularly. Use sand or salt on walking paths during winter. Clean up any spills in your garage right away. This includes oil or grease spills. What can I do in the bathroom? Use night lights. Install grab bars by the toilet and in the tub and shower. Do not use towel  bars as grab bars. Use non-skid mats or decals in the tub or shower. If you need to sit down in the shower, use a plastic, non-slip stool. Keep the floor dry. Clean up any water that spills on the floor as soon as it happens. Remove soap buildup in the tub or shower regularly. Attach bath mats securely with double-sided non-slip rug tape. Do not have throw rugs and other things on the floor that can make you trip. What can I do in the bedroom? Use night lights. Make sure that you have a light by your bed that is easy to reach. Do not use any sheets or blankets that are too big for your bed. They should not hang down onto the floor. Have a firm chair that has side arms. You can use this for support while you get dressed. Do not have throw rugs and other things on the floor that can make you trip. What can I do in the kitchen? Clean up any spills right away. Avoid walking on wet floors. Keep items that you use a lot in easy-to-reach places. If you need to reach something above you, use a strong step stool that has a grab bar. Keep electrical cords out of the way. Do not use floor polish or wax that makes floors slippery. If you must use wax, use non-skid floor wax. Do not have throw rugs and other things on the floor that can make you trip. What can I do with  my stairs? Do not leave any items on the stairs. Make sure that there are handrails on both sides of the stairs and use them. Fix handrails that are broken or loose. Make sure that handrails are as long as the stairways. Check any carpeting to make sure that it is firmly attached to the stairs. Fix any carpet that is loose or worn. Avoid having throw rugs at the top or bottom of the stairs. If you do have throw rugs, attach them to the floor with carpet tape. Make sure that you have a light switch at the top of the stairs and the bottom of the stairs. If you do not have them, ask someone to add them for you. What else can I do to help  prevent falls? Wear shoes that: Do not have high heels. Have rubber bottoms. Are comfortable and fit you well. Are closed at the toe. Do not wear sandals. If you use a stepladder: Make sure that it is fully opened. Do not climb a closed stepladder. Make sure that both sides of the stepladder are locked into place. Ask someone to hold it for you, if possible. Clearly mark and make sure that you can see: Any grab bars or handrails. First and last steps. Where the edge of each step is. Use tools that help you move around (mobility aids) if they are needed. These include: Canes. Walkers. Scooters. Crutches. Turn on the lights when you go into a dark area. Replace any light bulbs as soon as they burn out. Set up your furniture so you have a clear path. Avoid moving your furniture around. If any of your floors are uneven, fix them. If there are any pets around you, be aware of where they are. Review your medicines with your doctor. Some medicines can make you feel dizzy. This can increase your chance of falling. Ask your doctor what other things that you can do to help prevent falls. This information is not intended to replace advice given to you by your health care provider. Make sure you discuss any questions you have with your health care provider. Document Released: 06/17/2009 Document Revised: 01/27/2016 Document Reviewed: 09/25/2014 Elsevier Interactive Patient Education  2017 [employer].

## 2021-02-11 NOTE — Progress Notes (Signed)
Subjective:   Isabella Lindsey is a 75 y.o. female who presents for Medicare Annual (Subsequent) preventive examination.  Virtual Visit via Telephone Note  I connected with  [name] on 02/11/21 at 10:30 AM EDT by telephone and verified that I am speaking with the correct person using two identifiers.  Location: Patient: Home Provider: [facility] Persons participating in the virtual visit: patient/Nurse Health Advisor   I discussed the limitations, risks, security and privacy concerns of performing an evaluation and management service by telephone and the availability of in person appointments. The patient expressed understanding and agreed to proceed.  Interactive audio and video telecommunications were attempted between this nurse and patient, however failed, due to patient having technical difficulties OR patient did not have access to video capability.  We continued and completed visit with audio only.  Some vital signs may be absent or patient reported.   Gearold Wainer E Sirus Labrie, LPN   Review of Systems     Cardiac Risk Factors include: advanced age (>88men, >65 women);sedentary lifestyle;dyslipidemia;hypertension     Objective:    Today's Vitals   02/11/21 1030 02/11/21 1031  Weight: 157 lb (71.2 kg)   Height: 5\' 4"  (1.626 m)   PainSc:  5    Body mass index is 26.95 kg/m.  Advanced Directives 02/11/2021 02/11/2020 01/20/2019  Does Patient Have a Medical Advance Directive? Yes Yes Yes  Type of 01/22/2019 of [place];Living will Healthcare Power of [place];Living will Healthcare Power of [place];Living will  Does patient want to make changes to medical advance directive? - No - Patient declined No - Patient declined  Copy of Healthcare Power of Attorney in Chart? No - copy requested - No - copy requested    Current Medications (verified) Outpatient Encounter Medications as of 02/11/2021  Medication Sig   ALPRAZolam (XANAX) 0.25 MG tablet Take 1  tablet (0.25 mg total) by mouth at bedtime as needed for anxiety.   escitalopram (LEXAPRO) 20 MG tablet Take 1 tablet (20 mg total) by mouth daily.   hydrochlorothiazide (MICROZIDE) 12.5 MG capsule Take 1 capsule (12.5 mg total) by mouth daily.   levothyroxine (SYNTHROID) 75 MCG tablet Take 1 tablet (75 mcg total) by mouth daily.   losartan (COZAAR) 25 MG tablet Take 0.5 tablets (12.5 mg total) by mouth daily.   pantoprazole (PROTONIX) 40 MG tablet Take 40 mg by mouth daily.   rosuvastatin (CRESTOR) 10 MG tablet Take 1 tablet (10 mg total) by mouth daily.   terbinafine (LAMISIL) 250 MG tablet Take 1 tablet (250 mg total) by mouth daily.   No facility-administered encounter medications on file as of 02/11/2021.    Allergies (verified) Effexor [venlafaxine], Penicillins, and Erythromycin   History: Past Medical History:  Diagnosis Date   Allergic rhinitis    Depression    DJD (degenerative joint disease)    cervical and Rt. knee, Rt. 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   Hypertension    Insomnia    Occassional   Migraine    Seborrheic keratoses    Scattered seborrheic keratoses   Past Surgical History:  Procedure Laterality Date   ABDOMINAL HYSTERECTOMY  1986   Family History  Problem Relation Age of Onset   Cancer Father        lung   Arthritis  Sister    Breast cancer Neg Hx    Social History   Socioeconomic History   Marital status: Married    Spouse name: Lollie Sails   Number of children: 3   Years of education: Not on file   Highest education level: 12th grade  Occupational History   Occupation: Retired  Tobacco Use   Smoking status: Never   Smokeless tobacco: Never  Vaping Use   Vaping Use: Never used  Substance and Sexual Activity   Alcohol use: Never   Drug use: Never   Sexual  activity: Not Currently  Other Topics Concern   Not on file  Social History Narrative   Lives at home with husband. One son lives in [place].   Social Determinants of Health   Financial Resource Strain: Low Risk    Difficulty of Paying Living Expenses: Not hard at all  Food Insecurity: No Food Insecurity   Worried About Programme researcher, broadcasting/film/video in the Last Year: Never true   Ran Out of Food in the Last Year: Never true  Transportation Needs: No Transportation Needs   Lack of Transportation (Medical): No   Lack of Transportation (Non-Medical): No  Physical Activity: Insufficiently Active   Days of Exercise per Week: 7 days   Minutes of Exercise per Session: 10 min  Stress: No Stress Concern Present   Feeling of Stress : Only a little  Social Connections: Press photographer of Communication with Friends and Family: More than three times a week   Frequency of Social Gatherings with Friends and Family: More than three times a week   Attends Religious Services: More than 4 times per year   Active Member of [employer] or Organizations: Yes   Attends Engineer, structural: More than 4 times per year   Marital Status: Married    Tobacco Counseling Counseling given: Not Answered   Clinical Intake:  Pre-visit preparation completed: Yes  Pain : 0-10 Pain Score: 5  Pain Type: Chronic pain Pain Location: Knee Pain Orientation: Right, Left Pain Descriptors / Indicators: Aching, Sharp, Sore Pain Onset: More than a month ago Pain Frequency: Intermittent     BMI - recorded: 26.95 Nutritional Status: BMI 25 -29 Overweight Nutritional Risks: None Diabetes: No  How often do you need to have someone help you when you read instructions, pamphlets, or other written materials from your doctor or pharmacy?: 1 - Never  Diabetic?No  Interpreter Needed?: No  Information entered by :: Emmerie Battaglia, LPN   Activities of Daily Living In your present state of health, do you have  any difficulty performing the following activities: 02/11/2021  Hearing? N  Vision? N  Difficulty concentrating or making decisions? N  Walking or climbing stairs? N  Dressing or bathing? N  Doing errands, shopping? N  Preparing Food and eating ? N  Using the Toilet? N  In the past six months, have you accidently leaked urine? N  Do you have problems with loss of bowel control? N  Managing your Medications? N  Managing your Finances? N  Housekeeping or managing your Housekeeping? N  Some recent data might be hidden    Patient Care Team: Dettinger, Elige Radon, MD as PCP - General (Family Medicine) Danella Maiers, [facility] (Pharmacist) Corbin Ade, MD as Consulting Physician (Gastroenterology)  Indicate any recent Medical Services you may have received from other than [facility] providers in the past year (date may be approximate).     Assessment:   This  is a routine wellness examination for Dois DavenportSandra.  Hearing/Vision screen Hearing Screening - Comments:: Denies hearing difficulties  Vision Screening - Comments:: Denies vision difficulties - behind on eye exam with Dr Earlene Plateravis in [place]   Dietary issues and exercise activities discussed: Current Exercise Habits: Home exercise routine, Type of exercise: walking, Time (Minutes): 15, Frequency (Times/Week): 7, Weekly Exercise (Minutes/Week): 105, Intensity: Mild, Exercise limited by: orthopedic condition(s)   Goals Addressed             This Visit's Progress    Exercise 3x per week (30 min per time)   On track    Prevent falls   On track      Depression Screen PHQ 2/9 Scores 02/11/2021 01/14/2021 11/25/2020 09/17/2020 03/18/2020 02/11/2020 11/07/2019  PHQ - 2 Score 2 1 1  0 0 0 1  PHQ- 9 Score 5 - - - - - 3    Fall Risk Fall Risk  02/11/2021 01/14/2021 11/25/2020 09/17/2020 03/18/2020  Falls in the past year? 0 0 0 0 0  Number falls in past yr: 0 - - - -  Injury with Fall? 0 - - - -  Risk for fall due to : Impaired balance/gait;Orthopedic patient  - - - -  Follow up Education provided;Falls prevention discussed - - - -    FALL RISK PREVENTION PERTAINING TO THE HOME:  Any stairs in or around the home? Yes  If so, are there any without handrails? No  Home free of loose throw rugs in walkways, pet beds, electrical cords, etc? Yes  Adequate lighting in your home to reduce risk of falls? Yes   ASSISTIVE DEVICES UTILIZED TO PREVENT FALLS:  Life alert? No  Use of a cane, walker or w/c? Yes  Grab bars in the bathroom? No  Shower chair or bench in shower? No  Elevated toilet seat or a handicapped toilet? No   TIMED UP AND GO:  Was the test performed? No . Telephonic visit  Cognitive Function: Normal cognitive status assessed by direct observation by this Nurse Health Advisor. No abnormalities found.      6CIT Screen 02/11/2020 01/20/2019  What Year? 0 points 0 points  What month? 0 points 0 points  What time? 0 points 0 points  Count back from 20 0 points 0 points  Months in reverse 0 points 0 points  Repeat phrase 0 points 0 points  Total Score 0 0    Immunizations Immunization History  Administered Date(s) Administered   Fluad Quad(high Dose 65+) 06/03/2019, 06/18/2020   Influenza, High Dose Seasonal PF 06/13/2017, 06/26/2018   Moderna Sars-Covid-2 Vaccination 10/18/2019, 11/15/2019, 07/07/2020   Pneumococcal Conjugate-13 08/24/2014   Pneumococcal Polysaccharide-23 08/10/2011, 09/19/2019    TDAP status: Due, Education has been provided regarding the importance of this vaccine. Advised may receive this vaccine at local pharmacy or Health Dept. Aware to provide a copy of the vaccination record if obtained from local pharmacy or Health Dept. Verbalized acceptance and understanding.  Flu Vaccine status: Up to date  Pneumococcal vaccine status: Up to date  Covid-19 vaccine status: Completed vaccines  Qualifies for Shingles Vaccine? Yes   Zostavax completed No   Shingrix Completed?: No.    Education has been provided  regarding the importance of this vaccine. Patient has been advised to call insurance company to determine out of pocket expense if they have not yet received this vaccine. Advised may also receive vaccine at local pharmacy or Health Dept. Verbalized acceptance and understanding.  Screening Tests Health  Maintenance  Topic Date Due   TETANUS/TDAP  Never done   Zoster Vaccines- Shingrix (1 of 2) Never done   DEXA SCAN  Never done   COVID-19 Vaccine (4 - Booster for Moderna series) 11/04/2020   INFLUENZA VACCINE  04/04/2021   MAMMOGRAM  04/28/2021   Fecal DNA (Cologuard)  05/04/2021   Hepatitis C Screening  Completed   PNA vac Low Risk Adult  Completed   HPV VACCINES  Aged Out    Health Maintenance  Health Maintenance Due  Topic Date Due   TETANUS/TDAP  Never done   Zoster Vaccines- Shingrix (1 of 2) Never done   DEXA SCAN  Never done   COVID-19 Vaccine (4 - Booster for Moderna series) 11/04/2020    Colorectal cancer screening: Type of screening: Cologuard. Completed 05/04/2018. Repeat every 3 years  Mammogram status: Completed 04/28/2020. Repeat every year  Bone Density status: Ordered 02/11/2021. Pt provided with contact info and advised to call to schedule appt.  Lung Cancer Screening: (Low Dose CT Chest recommended if Age 45-80 years, 30 pack-year currently smoking OR have quit w/in 15years.) does not qualify.    Additional Screening:  Hepatitis C Screening: does qualify; Completed 05/27/2018  Vision Screening: Recommended annual ophthalmology exams for early detection of glaucoma and other disorders of the eye. Is the patient up to date with their annual eye exam?  No  Who is the provider or what is the name of the office in which the patient attends annual eye exams? [facility] [place] If pt is not established with a provider, would they like to be referred to a provider to establish care? No .   Dental Screening: Recommended annual dental exams for proper oral  hygiene  Community Resource Referral / Chronic Care Management: CRR required this visit?  No   CCM required this visit?  No      Plan:     I have personally reviewed and noted the following in the patient's chart:   Medical and social history Use of alcohol, tobacco or illicit drugs  Current medications and supplements including opioid prescriptions.  Functional ability and status Nutritional status Physical activity Advanced directives List of other physicians Hospitalizations, surgeries, and ER visits in previous 12 months Vitals Screenings to include cognitive, depression, and falls Referrals and appointments  In addition, I have reviewed and discussed with patient certain preventive protocols, quality metrics, and best practice recommendations. A written personalized care plan for preventive services as well as general preventive health recommendations were provided to patient.     [place] Constable, LPN   2/70/3500   Nurse Notes: None

## 2021-02-16 ENCOUNTER — Ambulatory Visit: Payer: Medicare Other | Admitting: Cardiology

## 2021-02-17 ENCOUNTER — Ambulatory Visit (INDEPENDENT_AMBULATORY_CARE_PROVIDER_SITE_OTHER): Payer: Medicare Other

## 2021-02-17 ENCOUNTER — Ambulatory Visit (INDEPENDENT_AMBULATORY_CARE_PROVIDER_SITE_OTHER): Payer: Medicare Other | Admitting: Family Medicine

## 2021-02-17 ENCOUNTER — Encounter: Payer: Self-pay | Admitting: Family Medicine

## 2021-02-17 ENCOUNTER — Other Ambulatory Visit: Payer: Self-pay

## 2021-02-17 VITALS — BP 130/67 | HR 72 | Ht 64.0 in | Wt 154.0 lb

## 2021-02-17 DIAGNOSIS — Z780 Asymptomatic menopausal state: Secondary | ICD-10-CM | POA: Diagnosis not present

## 2021-02-17 DIAGNOSIS — I10 Essential (primary) hypertension: Secondary | ICD-10-CM

## 2021-02-17 DIAGNOSIS — E039 Hypothyroidism, unspecified: Secondary | ICD-10-CM

## 2021-02-17 DIAGNOSIS — Z23 Encounter for immunization: Secondary | ICD-10-CM

## 2021-02-17 DIAGNOSIS — E782 Mixed hyperlipidemia: Secondary | ICD-10-CM | POA: Diagnosis not present

## 2021-02-17 DIAGNOSIS — K219 Gastro-esophageal reflux disease without esophagitis: Secondary | ICD-10-CM

## 2021-02-17 NOTE — Progress Notes (Signed)
BP 130/67   Pulse 72   Ht 5' 4"  (1.626 m)   Wt 154 lb (69.9 kg)   SpO2 100%   BMI 26.43 kg/m    Subjective:   Patient ID: Isabella Lindsey, female    DOB: 02/04/1946, 75 y.o.   MRN: [id number]  HPI: Isabella Lindsey is a 75 y.o. female presenting on 02/17/2021 for Medical Management of Chronic Issues, Hypothyroidism, and Hypertension   HPI Hypothyroidism recheck Patient is coming in for thyroid recheck today as well. They deny any issues with hair changes or heat or cold problems or diarrhea or constipation. They deny any chest pain or palpitations. They are currently on levothyroxine 75 micrograms   Hypertension Patient is currently on hydrochlorothiazide and losartan, and their blood pressure today is 130/67. Patient denies any lightheadedness or dizziness. Patient denies headaches, blurred vision, chest pains, shortness of breath, or weakness. Denies any side effects from medication and is content with current medication.   Hyperlipidemia Patient is coming in for recheck of his hyperlipidemia. The patient is currently taking Crestor. They deny any issues with myalgias or history of liver damage from it. They deny any focal numbness or weakness or chest pain.   GERD Patient is currently on pantoprazole.  She denies any major symptoms or abdominal pain or belching or burping. She denies any blood in her stool or lightheadedness or dizziness.   Relevant past medical, surgical, family and social history reviewed and updated as indicated. Interim medical history since our last visit reviewed. Allergies and medications reviewed and updated.  Review of Systems  Constitutional:  Negative for chills and fever.  Eyes:  Negative for visual disturbance.  Respiratory:  Negative for chest tightness and shortness of breath.   Cardiovascular:  Negative for chest pain and leg swelling.  Musculoskeletal:  Positive for arthralgias, gait problem and joint swelling. Negative for back pain.   Skin:  Negative for rash.  Neurological:  Negative for light-headedness and headaches.  Psychiatric/Behavioral:  Negative for agitation and behavioral problems.   All other systems reviewed and are negative.  Per HPI unless specifically indicated above   Allergies as of 02/17/2021       Reactions   Effexor [venlafaxine]    Dizziness   Penicillins    Erythromycin Rash        Medication List        Accurate as of February 17, 2021 11:28 AM. If you have any questions, ask your nurse or doctor.          ALPRAZolam 0.25 MG tablet Commonly known as: XANAX Take 1 tablet (0.25 mg total) by mouth at bedtime as needed for anxiety.   escitalopram 20 MG tablet Commonly known as: LEXAPRO Take 1 tablet (20 mg total) by mouth daily.   hydrochlorothiazide 12.5 MG capsule Commonly known as: MICROZIDE Take 1 capsule (12.5 mg total) by mouth daily.   levothyroxine 75 MCG tablet Commonly known as: SYNTHROID Take 1 tablet (75 mcg total) by mouth daily.   losartan 25 MG tablet Commonly known as: COZAAR Take 0.5 tablets (12.5 mg total) by mouth daily.   pantoprazole 40 MG tablet Commonly known as: PROTONIX Take 40 mg by mouth daily.   rosuvastatin 10 MG tablet Commonly known as: CRESTOR Take 1 tablet (10 mg total) by mouth daily.   terbinafine 250 MG tablet Commonly known as: LAMISIL Take 1 tablet (250 mg total) by mouth daily.         Objective:  BP 130/67   Pulse 72   Ht 5' 4"  (1.626 m)   Wt 154 lb (69.9 kg)   SpO2 100%   BMI 26.43 kg/m   Wt Readings from Last 3 Encounters:  02/17/21 154 lb (69.9 kg)  02/11/21 157 lb (71.2 kg)  01/24/21 157 lb 11.2 oz (71.5 kg)    Physical Exam Vitals and nursing note reviewed.  Constitutional:      General: She is not in acute distress.    Appearance: She is well-developed. She is not diaphoretic.  Eyes:     Conjunctiva/sclera: Conjunctivae normal.  Cardiovascular:     Rate and Rhythm: Normal rate and regular rhythm.      Heart sounds: Normal heart sounds. No murmur heard. Pulmonary:     Effort: Pulmonary effort is normal. No respiratory distress.     Breath sounds: Normal breath sounds. No wheezing.  Musculoskeletal:        General: No swelling.  Skin:    General: Skin is warm and dry.     Findings: No rash.  Neurological:     Mental Status: She is alert and oriented to person, place, and time.     Coordination: Coordination normal.  Psychiatric:        Behavior: Behavior normal.      Assessment & Plan:   Problem List Items Addressed This Visit       Cardiovascular and Mediastinum   Hypertension   Relevant Orders   BMP8+EGFR     Digestive   GERD (gastroesophageal reflux disease)     Endocrine   Hypothyroidism - Primary   Relevant Orders   TSH     Other   Hyperlipidemia   Relevant Orders   BMP8+EGFR   Other Visit Diagnoses     Postmenopausal       Relevant Orders   DG [facility] DEXA       Continue current medication, blood pressure looks good.  Return in a little over 2 months for her routine visit Follow up plan: Return in about 2 months (around 05/03/2021), or if symptoms worsen or fail to improve, for Thyroid and hypertension and anxiety recheck.  Counseling provided for all of the vaccine components Orders Placed This Encounter  Procedures   DG [facility] DEXA   BMP8+EGFR   TSH    Caryl Pina, MD [facility] 02/17/2021, 11:28 AM

## 2021-02-17 NOTE — Addendum Note (Signed)
Addended by: Dorene Sorrow on: 02/17/2021 11:31 AM   Modules accepted: Orders

## 2021-02-18 DIAGNOSIS — Z780 Asymptomatic menopausal state: Secondary | ICD-10-CM | POA: Diagnosis not present

## 2021-02-18 LAB — BMP8+EGFR
BUN/Creatinine Ratio: 18 (ref 12–28)
BUN: 23 mg/dL (ref 8–27)
CO2: 25 mmol/L (ref 20–29)
Calcium: 9.9 mg/dL (ref 8.7–10.3)
Chloride: 100 mmol/L (ref 96–106)
Creatinine, Ser: 1.27 mg/dL — ABNORMAL HIGH (ref 0.57–1.00)
Glucose: 96 mg/dL (ref 65–99)
Potassium: 3.6 mmol/L (ref 3.5–5.2)
Sodium: 142 mmol/L (ref 134–144)
eGFR: 44 mL/min/{1.73_m2} — ABNORMAL LOW (ref >59)

## 2021-02-18 LAB — TSH: TSH: 1.52 u[IU]/mL (ref 0.450–4.500)

## 2021-02-22 DIAGNOSIS — M25561 Pain in right knee: Secondary | ICD-10-CM | POA: Diagnosis not present

## 2021-02-22 DIAGNOSIS — M25562 Pain in left knee: Secondary | ICD-10-CM | POA: Diagnosis not present

## 2021-02-24 ENCOUNTER — Ambulatory Visit: Payer: Medicare Other | Admitting: Gastroenterology

## 2021-03-01 DIAGNOSIS — Z01812 Encounter for preprocedural laboratory examination: Secondary | ICD-10-CM | POA: Diagnosis not present

## 2021-03-01 DIAGNOSIS — R791 Abnormal coagulation profile: Secondary | ICD-10-CM | POA: Diagnosis not present

## 2021-03-01 DIAGNOSIS — M25561 Pain in right knee: Secondary | ICD-10-CM | POA: Diagnosis not present

## 2021-03-01 DIAGNOSIS — M170 Bilateral primary osteoarthritis of knee: Secondary | ICD-10-CM | POA: Diagnosis not present

## 2021-03-08 DIAGNOSIS — M25561 Pain in right knee: Secondary | ICD-10-CM | POA: Diagnosis not present

## 2021-03-08 DIAGNOSIS — M170 Bilateral primary osteoarthritis of knee: Secondary | ICD-10-CM | POA: Diagnosis not present

## 2021-03-10 ENCOUNTER — Telehealth: Payer: Self-pay | Admitting: Family Medicine

## 2021-03-10 DIAGNOSIS — M25561 Pain in right knee: Secondary | ICD-10-CM | POA: Diagnosis not present

## 2021-03-10 DIAGNOSIS — M170 Bilateral primary osteoarthritis of knee: Secondary | ICD-10-CM | POA: Diagnosis not present

## 2021-03-10 NOTE — Telephone Encounter (Signed)
It looks like both and Dr. Antoine Poche did this back in May, did not get the forms then or today needs something new.  But pretty sure we sent the form then, I do not have a form currently on my desk for this.

## 2021-03-10 NOTE — Telephone Encounter (Signed)
Oleg[facility] Messier [facility] [place] stating that patient is scheduled to have surgery on 03/16/21 and they are still waiting for [place] to send surgical clearance form to them.  Can fax form to [phone] Can call Olegario Messier at [phone] ext [phone]

## 2021-03-10 NOTE — Telephone Encounter (Signed)
Form was faxed on 02/04/21 to number on form which is different then the one [place] with [facility] Pont and [place]  left.  Faxed to both numbers and received an ok.

## 2021-03-12 ENCOUNTER — Other Ambulatory Visit: Payer: Self-pay | Admitting: Family Medicine

## 2021-03-16 DIAGNOSIS — M1711 Unilateral primary osteoarthritis, right knee: Secondary | ICD-10-CM | POA: Diagnosis not present

## 2021-03-16 DIAGNOSIS — G8918 Other acute postprocedural pain: Secondary | ICD-10-CM | POA: Diagnosis not present

## 2021-03-16 DIAGNOSIS — Z96651 Presence of right artificial knee joint: Secondary | ICD-10-CM | POA: Diagnosis not present

## 2021-03-17 ENCOUNTER — Telehealth: Payer: Self-pay | Admitting: Family Medicine

## 2021-03-17 NOTE — Telephone Encounter (Signed)
Pt aware.

## 2021-03-17 NOTE — Telephone Encounter (Signed)
Yes there is no interaction with the medicines she is currently on, she does need to know that gabapentin sometimes can be sedating so be careful driving on until she knows how she reacts to it.

## 2021-03-18 DIAGNOSIS — M25562 Pain in left knee: Secondary | ICD-10-CM | POA: Diagnosis not present

## 2021-03-18 DIAGNOSIS — M25561 Pain in right knee: Secondary | ICD-10-CM | POA: Diagnosis not present

## 2021-03-21 DIAGNOSIS — M25561 Pain in right knee: Secondary | ICD-10-CM | POA: Diagnosis not present

## 2021-03-21 DIAGNOSIS — M25562 Pain in left knee: Secondary | ICD-10-CM | POA: Diagnosis not present

## 2021-03-22 DIAGNOSIS — M1711 Unilateral primary osteoarthritis, right knee: Secondary | ICD-10-CM | POA: Diagnosis not present

## 2021-03-23 DIAGNOSIS — M25561 Pain in right knee: Secondary | ICD-10-CM | POA: Diagnosis not present

## 2021-03-23 DIAGNOSIS — M25562 Pain in left knee: Secondary | ICD-10-CM | POA: Diagnosis not present

## 2021-03-25 DIAGNOSIS — M25562 Pain in left knee: Secondary | ICD-10-CM | POA: Diagnosis not present

## 2021-03-25 DIAGNOSIS — M25561 Pain in right knee: Secondary | ICD-10-CM | POA: Diagnosis not present

## 2021-03-28 DIAGNOSIS — M25561 Pain in right knee: Secondary | ICD-10-CM | POA: Diagnosis not present

## 2021-03-28 DIAGNOSIS — M25562 Pain in left knee: Secondary | ICD-10-CM | POA: Diagnosis not present

## 2021-03-29 DIAGNOSIS — M1711 Unilateral primary osteoarthritis, right knee: Secondary | ICD-10-CM | POA: Diagnosis not present

## 2021-03-30 DIAGNOSIS — M25561 Pain in right knee: Secondary | ICD-10-CM | POA: Diagnosis not present

## 2021-03-30 DIAGNOSIS — M25562 Pain in left knee: Secondary | ICD-10-CM | POA: Diagnosis not present

## 2021-04-04 DIAGNOSIS — M25562 Pain in left knee: Secondary | ICD-10-CM | POA: Diagnosis not present

## 2021-04-04 DIAGNOSIS — M25561 Pain in right knee: Secondary | ICD-10-CM | POA: Diagnosis not present

## 2021-04-06 DIAGNOSIS — M25561 Pain in right knee: Secondary | ICD-10-CM | POA: Diagnosis not present

## 2021-04-06 DIAGNOSIS — M25562 Pain in left knee: Secondary | ICD-10-CM | POA: Diagnosis not present

## 2021-04-11 DIAGNOSIS — M25561 Pain in right knee: Secondary | ICD-10-CM | POA: Diagnosis not present

## 2021-04-11 DIAGNOSIS — M25562 Pain in left knee: Secondary | ICD-10-CM | POA: Diagnosis not present

## 2021-04-13 ENCOUNTER — Encounter: Payer: Self-pay | Admitting: Internal Medicine

## 2021-04-13 DIAGNOSIS — M25562 Pain in left knee: Secondary | ICD-10-CM | POA: Diagnosis not present

## 2021-04-13 DIAGNOSIS — M25561 Pain in right knee: Secondary | ICD-10-CM | POA: Diagnosis not present

## 2021-04-18 DIAGNOSIS — M25561 Pain in right knee: Secondary | ICD-10-CM | POA: Diagnosis not present

## 2021-04-18 DIAGNOSIS — M25562 Pain in left knee: Secondary | ICD-10-CM | POA: Diagnosis not present

## 2021-04-19 ENCOUNTER — Other Ambulatory Visit: Payer: Self-pay

## 2021-04-19 ENCOUNTER — Encounter: Payer: Self-pay | Admitting: Family Medicine

## 2021-04-19 ENCOUNTER — Ambulatory Visit (INDEPENDENT_AMBULATORY_CARE_PROVIDER_SITE_OTHER): Payer: Medicare Other | Admitting: Family Medicine

## 2021-04-19 VITALS — BP 148/72 | HR 78 | Ht 64.0 in | Wt 154.0 lb

## 2021-04-19 DIAGNOSIS — Z1211 Encounter for screening for malignant neoplasm of colon: Secondary | ICD-10-CM

## 2021-04-19 DIAGNOSIS — F411 Generalized anxiety disorder: Secondary | ICD-10-CM

## 2021-04-19 DIAGNOSIS — R1013 Epigastric pain: Secondary | ICD-10-CM

## 2021-04-19 DIAGNOSIS — E039 Hypothyroidism, unspecified: Secondary | ICD-10-CM | POA: Diagnosis not present

## 2021-04-19 DIAGNOSIS — I10 Essential (primary) hypertension: Secondary | ICD-10-CM

## 2021-04-19 MED ORDER — ALPRAZOLAM 0.25 MG PO TABS: 0.2500 mg | ORAL_TABLET | Freq: Every evening | ORAL | 5 refills | Status: DC | PRN

## 2021-04-19 NOTE — Progress Notes (Signed)
BP (!) 148/72   Pulse 78   Ht 5' 4"  (1.626 m)   Wt 154 lb (69.9 kg)   SpO2 100%   BMI 26.43 kg/m    Subjective:   Patient ID: Isabella Lindsey, female    DOB: 1945-09-27, 75 y.o.   MRN: [id number]  HPI: Isabella Lindsey is a 75 y.o. female presenting on 04/19/2021 for Medical Management of Chronic Issues, Hypertension, Hypothyroidism, and Anxiety (37mfollow up)   HPI Hypertension Patient is currently on hydrochlorothiazide and losartan, and their blood pressure today is 148/72. Patient denies any lightheadedness or dizziness. Patient denies headaches, blurred vision, chest pains, shortness of breath, or weakness. Denies any side effects from medication and is content with current medication.   Hypothyroidism recheck Patient is coming in for thyroid recheck today as well. They deny any issues with hair changes or heat or cold problems or diarrhea or constipation. They deny any chest pain or palpitations. They are currently on levothyroxine 75 micrograms   Anxiety recheck Patient is coming in today for anxiety recheck.  She currently takes Lexapro and alprazolam and feels like she is doing well on those.  She is due for refill on the alprazolam and she does not take it every day but does take it most nights.  She denies any suicidal ideations or thoughts of hurting self.  Patient just had her right knee replacement done is doing therapy for it feels like things are improving.  Relevant past medical, surgical, family and social history reviewed and updated as indicated. Interim medical history since our last visit reviewed. Allergies and medications reviewed and updated.  Review of Systems  Constitutional:  Negative for chills and fever.  HENT:  Negative for congestion, ear discharge and ear pain.   Eyes:  Negative for redness and visual disturbance.  Respiratory:  Negative for chest tightness and shortness of breath.   Cardiovascular:  Negative for chest pain and leg swelling.   Genitourinary:  Negative for difficulty urinating and dysuria.  Musculoskeletal:  Positive for arthralgias, gait problem and joint swelling. Negative for back pain.  Skin:  Negative for rash.  Neurological:  Negative for light-headedness and headaches.  Psychiatric/Behavioral:  Negative for agitation and behavioral problems.   All other systems reviewed and are negative.  Per HPI unless specifically indicated above   Allergies as of 04/19/2021       Reactions   Effexor [venlafaxine]    Dizziness   Penicillins    Erythromycin Rash        Medication List        Accurate as of April 19, 2021 11:51 AM. If you have any questions, ask your nurse or doctor.          ALPRAZolam 0.25 MG tablet Commonly known as: XANAX Take 1 tablet (0.25 mg total) by mouth at bedtime as needed for anxiety. Start taking on: May 16, 2021 What changed: These instructions start on May 16, 2021. If you are unsure what to do until then, ask your doctor or other care provider. Changed by: JFransisca KaufmannDettinger, MD   escitalopram 20 MG tablet Commonly known as: LEXAPRO Take 1 tablet (20 mg total) by mouth daily.   hydrochlorothiazide 12.5 MG capsule Commonly known as: MICROZIDE Take 1 capsule (12.5 mg total) by mouth daily.   levothyroxine 75 MCG tablet Commonly known as: SYNTHROID Take 1 tablet (75 mcg total) by mouth daily.   losartan 25 MG tablet Commonly known as: COZAAR Take 0.5  tablets (12.5 mg total) by mouth daily.   pantoprazole 40 MG tablet Commonly known as: PROTONIX Take 40 mg by mouth daily.   rosuvastatin 10 MG tablet Commonly known as: CRESTOR Take 1 tablet (10 mg total) by mouth daily.   terbinafine 250 MG tablet Commonly known as: LAMISIL Take 1 tablet by mouth once daily         Objective:   BP (!) 148/72   Pulse 78   Ht 5' 4"  (1.626 m)   Wt 154 lb (69.9 kg)   SpO2 100%   BMI 26.43 kg/m   Wt Readings from Last 3 Encounters:  04/19/21 154 lb  (69.9 kg)  02/17/21 154 lb (69.9 kg)  02/11/21 157 lb (71.2 kg)    Physical Exam Vitals and nursing note reviewed.  Constitutional:      General: She is not in acute distress.    Appearance: She is well-developed. She is not diaphoretic.  Eyes:     Conjunctiva/sclera: Conjunctivae normal.  Cardiovascular:     Rate and Rhythm: Normal rate and regular rhythm.     Heart sounds: Normal heart sounds. No murmur heard. Pulmonary:     Effort: Pulmonary effort is normal. No respiratory distress.     Breath sounds: Normal breath sounds. No wheezing.  Skin:    General: Skin is warm and dry.     Findings: No rash.  Neurological:     Mental Status: She is alert and oriented to person, place, and time.     Coordination: Coordination normal.  Psychiatric:        Behavior: Behavior normal.      Assessment & Plan:   Problem List Items Addressed This Visit       Cardiovascular and Mediastinum   Hypertension   Relevant Orders   CMP14+EGFR   Lipid panel     Endocrine   Hypothyroidism   Relevant Orders   TSH     Other   GAD (generalized anxiety disorder)   Relevant Medications   ALPRAZolam (XANAX) 0.25 MG tablet (Start on 05/16/2021)   Other Visit Diagnoses     Epigastric abdominal pain    -  Primary   Relevant Orders   Ambulatory referral to Gastroenterology   CBC with Differential/Platelet   Colon cancer screening       Relevant Orders   Cologuard       Continue current medication, no changes, will do blood work today and referrals to GI. Follow up plan: Return in about 6 months (around 10/20/2021), or if symptoms worsen or fail to improve, for Thyroid and anxiety and physical.  Counseling provided for all of the vaccine components Orders Placed This Encounter  Procedures   Cologuard   CBC with Differential/Platelet   CMP14+EGFR   Lipid panel   TSH   Ambulatory referral to Gastroenterology     Caryl Pina, MD [facility] 04/19/2021, 11:51 AM

## 2021-04-20 ENCOUNTER — Other Ambulatory Visit: Payer: Self-pay | Admitting: Family Medicine

## 2021-04-20 ENCOUNTER — Telehealth: Payer: Self-pay | Admitting: Family Medicine

## 2021-04-20 DIAGNOSIS — M25562 Pain in left knee: Secondary | ICD-10-CM | POA: Diagnosis not present

## 2021-04-20 DIAGNOSIS — M25561 Pain in right knee: Secondary | ICD-10-CM | POA: Diagnosis not present

## 2021-04-20 DIAGNOSIS — Z1231 Encounter for screening mammogram for malignant neoplasm of breast: Secondary | ICD-10-CM

## 2021-04-20 LAB — CBC WITH DIFFERENTIAL/PLATELET
Basophils Absolute: 0.1 10*3/uL (ref 0.0–0.2)
Basos: 1 %
EOS (ABSOLUTE): 0.3 10*3/uL (ref 0.0–0.4)
Eos: 2 %
Hematocrit: 38.4 % (ref 34.0–46.6)
Hemoglobin: 11.9 g/dL (ref 11.1–15.9)
Immature Grans (Abs): 0.1 10*3/uL (ref 0.0–0.1)
Immature Granulocytes: 1 %
Lymphocytes Absolute: 2 10*3/uL (ref 0.7–3.1)
Lymphs: 17 %
MCH: 27.2 pg (ref 26.6–33.0)
MCHC: 31 g/dL — ABNORMAL LOW (ref 31.5–35.7)
MCV: 88 fL (ref 79–97)
Monocytes Absolute: 0.8 10*3/uL (ref 0.1–0.9)
Monocytes: 6 %
Neutrophils Absolute: 8.7 10*3/uL — ABNORMAL HIGH (ref 1.4–7.0)
Neutrophils: 73 %
Platelets: 305 10*3/uL (ref 150–450)
RBC: 4.38 x10E6/uL (ref 3.77–5.28)
RDW: 15.3 % (ref 11.7–15.4)
WBC: 11.9 10*3/uL — ABNORMAL HIGH (ref 3.4–10.8)

## 2021-04-20 LAB — CMP14+EGFR
ALT: 13 IU/L (ref 0–32)
AST: 18 IU/L (ref 0–40)
Albumin/Globulin Ratio: 2 (ref 1.2–2.2)
Albumin: 4.3 g/dL (ref 3.7–4.7)
Alkaline Phosphatase: 79 IU/L (ref 44–121)
BUN/Creatinine Ratio: 19 (ref 12–28)
BUN: 20 mg/dL (ref 8–27)
Bilirubin Total: 0.4 mg/dL (ref 0.0–1.2)
CO2: 23 mmol/L (ref 20–29)
Calcium: 9.7 mg/dL (ref 8.7–10.3)
Chloride: 100 mmol/L (ref 96–106)
Creatinine, Ser: 1.07 mg/dL — ABNORMAL HIGH (ref 0.57–1.00)
Globulin, Total: 2.2 g/dL (ref 1.5–4.5)
Glucose: 99 mg/dL (ref 65–99)
Potassium: 3.9 mmol/L (ref 3.5–5.2)
Sodium: 140 mmol/L (ref 134–144)
Total Protein: 6.5 g/dL (ref 6.0–8.5)
eGFR: 55 mL/min/{1.73_m2} — ABNORMAL LOW (ref >59)

## 2021-04-20 LAB — TSH: TSH: 6.13 u[IU]/mL — ABNORMAL HIGH (ref 0.450–4.500)

## 2021-04-20 LAB — LIPID PANEL
Chol/HDL Ratio: 2.7 ratio (ref 0.0–4.4)
Cholesterol, Total: 197 mg/dL (ref 100–199)
HDL: 74 mg/dL (ref >39)
LDL Chol Calc (NIH): 101 mg/dL — ABNORMAL HIGH (ref 0–99)
Triglycerides: 128 mg/dL (ref 0–149)
VLDL Cholesterol Cal: 22 mg/dL (ref 5–40)

## 2021-04-26 DIAGNOSIS — M1711 Unilateral primary osteoarthritis, right knee: Secondary | ICD-10-CM | POA: Diagnosis not present

## 2021-04-29 ENCOUNTER — Ambulatory Visit (INDEPENDENT_AMBULATORY_CARE_PROVIDER_SITE_OTHER): Payer: Medicare Other | Admitting: Nurse Practitioner

## 2021-04-29 ENCOUNTER — Encounter: Payer: Self-pay | Admitting: Nurse Practitioner

## 2021-04-29 ENCOUNTER — Other Ambulatory Visit: Payer: Self-pay | Admitting: Nurse Practitioner

## 2021-04-29 DIAGNOSIS — R399 Unspecified symptoms and signs involving the genitourinary system: Secondary | ICD-10-CM

## 2021-04-29 DIAGNOSIS — R300 Dysuria: Secondary | ICD-10-CM | POA: Diagnosis not present

## 2021-04-29 LAB — MICROSCOPIC EXAMINATION
Epithelial Cells (non renal): NONE SEEN /hpf (ref 0–10)
Renal Epithel, UA: NONE SEEN /hpf
WBC, UA: >30 /hpf — AB (ref 0–5)

## 2021-04-29 LAB — URINALYSIS, ROUTINE W REFLEX MICROSCOPIC
Bilirubin, UA: NEGATIVE
Glucose, UA: NEGATIVE
Ketones, UA: NEGATIVE
Nitrite, UA: NEGATIVE
Protein,UA: NEGATIVE
Specific Gravity, UA: 1.01 (ref 1.005–1.030)
Urobilinogen, Ur: 0.2 mg/dL (ref 0.2–1.0)
pH, UA: 6.5 (ref 5.0–7.5)

## 2021-04-29 MED ORDER — SULFAMETHOXAZOLE-TRIMETHOPRIM 800-160 MG PO TABS: 1.0000 | ORAL_TABLET | Freq: Two times a day (BID) | ORAL | 0 refills | Status: DC

## 2021-04-29 NOTE — Assessment & Plan Note (Signed)
Symptoms of UTI in the past 24 to 36 hours.  Pelvic pain, burning, no nausea chills or fever associated with symptoms.  Completed urinalysis results pending.  Increase hydration.

## 2021-04-29 NOTE — Progress Notes (Signed)
   Virtual Visit  Note Due to COVID-19 pandemic this visit was conducted virtually. This visit type was conducted due to national recommendations for restrictions regarding the COVID-19 Pandemic (e.g. social distancing, sheltering in place) in an effort to limit this patient's exposure and mitigate transmission in our community. All issues noted in this document were discussed and addressed.  A physical exam was not performed with this format.  I connected with Isabella Lindsey on 04/29/21 at 8:16 am  by telephone and verified that I am speaking with the correct person using two identifiers. Isabella Lindsey is currently located at home during visit. The provider, Daryll Drown, NP is located in their office at time of visit.  I discussed the limitations, risks, security and privacy concerns of performing an evaluation and management service by telephone and the availability of in person appointments. I also discussed with the patient that there may be a patient responsible charge related to this service. The patient expressed understanding and agreed to proceed.   History and Present Illness:  Dysuria  This is a new problem. Episode onset: 24 to 36 hours. The problem occurs intermittently. The problem has been gradually worsening. The quality of the pain is described as burning. The pain is moderate. There has been no fever. Associated symptoms include urgency. Pertinent negatives include no chills, flank pain, frequency or nausea. Her past medical history is significant for recurrent UTIs. There is no history of catheterization.  Mildly    Review of Systems  Constitutional:  Negative for chills.  Gastrointestinal:  Negative for nausea.  Genitourinary:  Positive for dysuria and urgency. Negative for flank pain and frequency.    Observations/Objective: Televisit patient not in distress.  Assessment and Plan: Symptoms of UTI in the past 24 to 36 hours.  Pelvic pain, burning, no nausea  chills or fever associated with symptoms.  Completed urinalysis results pending.  Increase hydration,  Follow Up Instructions: Follow-up with unresolved symptoms.    I discussed the assessment and treatment plan with the patient. The patient was provided an opportunity to ask questions and all were answered. The patient agreed with the plan and demonstrated an understanding of the instructions.   The patient was advised to call back or seek an in-person evaluation if the symptoms worsen or if the condition fails to improve as anticipated.  The above assessment and management plan was discussed with the patient. The patient verbalized understanding of and has agreed to the management plan. Patient is aware to call the clinic if symptoms persist or worsen. Patient is aware when to return to the clinic for a follow-up visit. Patient educated on when it is appropriate to go to the emergency department.   Time call ended: 8:26 AM  I provided 10 minutes of  non face-to-face time during this encounter.  To worsening  Daryll Drown, NP

## 2021-04-29 NOTE — Addendum Note (Signed)
Addended by: Daryll Drown on: 04/29/2021 12:11 PM   Modules accepted: Orders

## 2021-05-04 LAB — CULTURE, URINE COMPREHENSIVE

## 2021-05-10 DIAGNOSIS — M1712 Unilateral primary osteoarthritis, left knee: Secondary | ICD-10-CM | POA: Diagnosis not present

## 2021-05-16 LAB — COLOGUARD

## 2021-05-17 ENCOUNTER — Other Ambulatory Visit: Payer: Self-pay | Admitting: Family Medicine

## 2021-05-17 DIAGNOSIS — F411 Generalized anxiety disorder: Secondary | ICD-10-CM

## 2021-05-17 DIAGNOSIS — M1711 Unilateral primary osteoarthritis, right knee: Secondary | ICD-10-CM | POA: Diagnosis not present

## 2021-05-23 ENCOUNTER — Ambulatory Visit: Payer: Medicare Other | Admitting: Gastroenterology

## 2021-05-24 DIAGNOSIS — M1712 Unilateral primary osteoarthritis, left knee: Secondary | ICD-10-CM | POA: Diagnosis not present

## 2021-06-02 ENCOUNTER — Telehealth: Payer: Self-pay | Admitting: Family Medicine

## 2021-06-02 DIAGNOSIS — Z1211 Encounter for screening for malignant neoplasm of colon: Secondary | ICD-10-CM

## 2021-06-02 NOTE — Telephone Encounter (Signed)
NEW ORDER PUT IN

## 2021-06-17 DIAGNOSIS — Z1211 Encounter for screening for malignant neoplasm of colon: Secondary | ICD-10-CM | POA: Diagnosis not present

## 2021-06-21 ENCOUNTER — Telehealth: Payer: Self-pay | Admitting: Cardiology

## 2021-06-21 NOTE — Telephone Encounter (Signed)
  Antoine Poche, MD  Vallarie Mare That is fine with me    Dominga Ferry MD       Rollene Rotunda, MD  Vallarie Mare OK with me.          Previous Messages   ----- Message -----  From: Vallarie Mare  Sent: 06/01/2021   2:03 PM EDT  To: Rollene Rotunda, MD, Barnie Mort, *   Pt is requesting to switch from Dr. Antoine Poche to Dr. Wyline Mood - pt's husand Mareesa Gathright sees Dr. Wyline Mood and it is closer to home- would this be ok?   Thank you

## 2021-06-24 LAB — COLOGUARD: Cologuard: NEGATIVE

## 2021-06-28 DIAGNOSIS — M1711 Unilateral primary osteoarthritis, right knee: Secondary | ICD-10-CM | POA: Diagnosis not present

## 2021-06-29 ENCOUNTER — Ambulatory Visit
Admission: RE | Admit: 2021-06-29 | Discharge: 2021-06-29 | Disposition: A | Discharge status: Home / Self Care | Payer: Medicare Other | Source: Ambulatory Visit | Attending: Family Medicine | Admitting: Family Medicine

## 2021-06-29 ENCOUNTER — Other Ambulatory Visit: Payer: Self-pay

## 2021-06-29 DIAGNOSIS — Z1231 Encounter for screening mammogram for malignant neoplasm of breast: Secondary | ICD-10-CM

## 2021-07-15 ENCOUNTER — Telehealth: Payer: Self-pay | Admitting: Cardiology

## 2021-07-15 NOTE — Telephone Encounter (Signed)
-----   Message -----  From: Antoine Poche, MD  Sent: 06/15/2021  11:29 AM EDT  To: Vallarie Mare   That is fine with me    Dominga Ferry MD  ----- Message -----  From: Vallarie Mare  Sent: 06/01/2021   2:03 PM EDT  To: Rollene Rotunda, MD, Barnie Mort, *   Pt is requesting to switch from Dr. Antoine Poche to Dr. Wyline Mood - pt's husand Damara Klunder sees Dr. Wyline Mood and it is closer to home- would this be ok?   Thank you

## 2021-08-05 ENCOUNTER — Other Ambulatory Visit: Payer: Self-pay | Admitting: Gastroenterology

## 2021-08-05 DIAGNOSIS — R1310 Dysphagia, unspecified: Secondary | ICD-10-CM

## 2021-08-05 DIAGNOSIS — K219 Gastro-esophageal reflux disease without esophagitis: Secondary | ICD-10-CM | POA: Diagnosis not present

## 2021-08-05 DIAGNOSIS — R1013 Epigastric pain: Secondary | ICD-10-CM | POA: Diagnosis not present

## 2021-08-12 ENCOUNTER — Ambulatory Visit: Payer: Medicare Other | Admitting: Gastroenterology

## 2021-09-06 ENCOUNTER — Other Ambulatory Visit: Payer: Self-pay | Admitting: Family Medicine

## 2021-09-06 ENCOUNTER — Encounter: Payer: Self-pay | Admitting: Nurse Practitioner

## 2021-09-06 ENCOUNTER — Ambulatory Visit (INDEPENDENT_AMBULATORY_CARE_PROVIDER_SITE_OTHER): Payer: Medicare Other | Admitting: Nurse Practitioner

## 2021-09-06 VITALS — BP 124/68 | HR 79 | Temp 98.1°F | Ht 64.0 in | Wt 162.2 lb

## 2021-09-06 DIAGNOSIS — R300 Dysuria: Secondary | ICD-10-CM

## 2021-09-06 LAB — URINALYSIS, ROUTINE W REFLEX MICROSCOPIC
Bilirubin, UA: NEGATIVE
Ketones, UA: NEGATIVE
Nitrite, UA: POSITIVE — AB
Specific Gravity, UA: 1.02 (ref 1.005–1.030)
Urobilinogen, Ur: 1 mg/dL (ref 0.2–1.0)
pH, UA: 7 (ref 5.0–7.5)

## 2021-09-06 LAB — MICROSCOPIC EXAMINATION: WBC, UA: >30 /hpf — AB (ref 0–5)

## 2021-09-06 MED ORDER — SULFAMETHOXAZOLE-TRIMETHOPRIM 800-160 MG PO TABS: 1.0000 | ORAL_TABLET | Freq: Two times a day (BID) | ORAL | 0 refills | Status: DC

## 2021-09-06 NOTE — Progress Notes (Signed)
Acute Office Visit  Subjective:    Patient ID: Isabella Lindsey, female    DOB: Jan 01, 1946, 76 y.o.   MRN: [id number]  Chief Complaint  Patient presents with   Dysuria    Dysuria  Associated symptoms include frequency.  Patient is in today for Urinary Tract Infection: Patient complains of abnormal smelling urine, dysuria, and frequency She has had symptoms for 4 days. Patient also complains of  lower abdominal pain . Patient denies congestion, cough, and fever. Patient does have a history of recurrent UTI.  Patient does not have a history of pyelonephritis.    Past Medical History:  Diagnosis Date   Allergic rhinitis    Depression    DJD (degenerative joint disease)    cervical and Rt. knee, Rt. 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   Hypertension    Insomnia    Occassional   Migraine    Seborrheic keratoses    Scattered seborrheic keratoses    Past Surgical History:  Procedure Laterality Date   ABDOMINAL HYSTERECTOMY  1986    Family History  Problem Relation Age of Onset   Cancer Father        lung   Arthritis Sister    Breast cancer Neg Hx     Social History   Socioeconomic History   Marital status: Married    Spouse name: Lynann Bologna   Number of children: 3   Years of education: Not on file   Highest education level: 12th grade  Occupational History   Occupation: Retired  Tobacco Use   Smoking status: Never   Smokeless tobacco: Never  Vaping Use   Vaping Use: Never used  Substance and Sexual Activity   Alcohol use: Never   Drug use: Never   Sexual activity: Not Currently  Other Topics Concern   Not on file  Social History Narrative   Lives at home with husband. One son lives in [place].   Social Determinants of Health   Financial Resource Strain: Low Risk    Difficulty  of Paying Living Expenses: Not hard at all  Food Insecurity: No Food Insecurity   Worried About Charity fundraiser in the Last Year: Never true   [place] in the Last Year: Never true  Transportation Needs: No Transportation Needs   Lack of Transportation (Medical): No   Lack of Transportation (Non-Medical): No  Physical Activity: Insufficiently Active   Days of Exercise per Week: 7 days   Minutes of Exercise per Session: 10 min  Stress: No Stress Concern Present   Feeling of Stress : Only a little  Social Connections: Engineer, building services of Communication with Friends and Family: More than three times a week   Frequency of Social Gatherings with Friends and Family: More than three times a week   Attends Religious Services: More than 4 times per year   Active Member of [employer] or Organizations: Yes   Attends Music therapist: More than 4 times per year   Marital Status: Married  Human resources officer Violence: Not At Risk   Fear of Current or Ex-Partner: No   Emotionally Abused: No   Physically Abused: No   Sexually Abused: No    Outpatient Medications Prior to Visit  Medication Sig Dispense Refill  ALPRAZolam (XANAX) 0.25 MG tablet Take 1 tablet (0.25 mg total) by mouth at bedtime as needed for anxiety. 30 tablet 5   escitalopram (LEXAPRO) 20 MG tablet Take 1 tablet (20 mg total) by mouth daily. 90 tablet 3   hydrochlorothiazide (MICROZIDE) 12.5 MG capsule Take 1 capsule (12.5 mg total) by mouth daily. 90 capsule 3   levothyroxine (SYNTHROID) 75 MCG tablet Take 1 tablet (75 mcg total) by mouth daily. 90 tablet 3   losartan (COZAAR) 25 MG tablet Take 0.5 tablets (12.5 mg total) by mouth daily. 45 tablet 3   pantoprazole (PROTONIX) 40 MG tablet Take 40 mg by mouth daily.     rosuvastatin (CRESTOR) 10 MG tablet Take 1 tablet (10 mg total) by mouth daily. 90 tablet 3   sulfamethoxazole-trimethoprim (BACTRIM DS) 800-160 MG tablet Take 1 tablet by mouth 2 (two)  times daily. 10 tablet 0   terbinafine (LAMISIL) 250 MG tablet Take 1 tablet by mouth once daily 90 tablet 0   No facility-administered medications prior to visit.    Allergies  Allergen Reactions   Effexor [Venlafaxine]     Dizziness   Penicillins    Erythromycin Rash    Review of Systems  Constitutional: Negative.   HENT: Negative.    Respiratory: Negative.    Cardiovascular: Negative.   Gastrointestinal:  Positive for abdominal pain.  Genitourinary:  Positive for dysuria and frequency.  Skin:  Negative for rash.  All other systems reviewed and are negative.     Objective:    Physical Exam Vitals and nursing note reviewed.  HENT:     Right Ear: External ear normal.     Left Ear: External ear normal.     Mouth/Throat:     Mouth: Mucous membranes are moist.     Pharynx: Oropharynx is clear.  Eyes:     Conjunctiva/sclera: Conjunctivae normal.  Cardiovascular:     Rate and Rhythm: Normal rate and regular rhythm.     Pulses: Normal pulses.     Heart sounds: Normal heart sounds.  Pulmonary:     Effort: Pulmonary effort is normal.     Breath sounds: Normal breath sounds.  Abdominal:     General: Bowel sounds are normal.     Tenderness: There is abdominal tenderness. There is no right CVA tenderness.  Skin:    General: Skin is warm.     Findings: No rash.  Neurological:     Mental Status: She is alert and oriented to person, place, and time.  Psychiatric:        Mood and Affect: Mood normal.        Behavior: Behavior normal.    BP 124/68    Pulse 79    Temp 98.1 F (36.7 C) (Temporal)    Ht _0  (1.626 m)    Wt 162 lb 4 oz (73.6 kg)    BMI 27.85 kg/m  Wt Readings from Last 3 Encounters:  09/06/21 162 lb 4 oz (73.6 kg)  04/19/21 154 lb (69.9 kg)  02/17/21 154 lb (69.9 kg)    Health Maintenance Due  Topic Date Due   Zoster Vaccines- Shingrix (1 of 2) Never done    There are no preventive care reminders to display for this patient.   Lab Results   Component Value Date   TSH 6.130 (H) 04/19/2021   Lab Results  Component Value Date   WBC 11.9 (H) 04/19/2021   HGB 11.9 04/19/2021   HCT 38.4 04/19/2021  MCV 88 04/19/2021   PLT 305 04/19/2021   Lab Results  Component Value Date   NA 140 04/19/2021   K 3.9 04/19/2021   CO2 23 04/19/2021   GLUCOSE 99 04/19/2021   BUN 20 04/19/2021   CREATININE 1.07 (H) 04/19/2021   BILITOT 0.4 04/19/2021   ALKPHOS 79 04/19/2021   AST 18 04/19/2021   ALT 13 04/19/2021   PROT 6.5 04/19/2021   ALBUMIN 4.3 04/19/2021   CALCIUM 9.7 04/19/2021   ANIONGAP 12 11/29/2017   EGFR 55 (L) 04/19/2021   Lab Results  Component Value Date   CHOL 197 04/19/2021   Lab Results  Component Value Date   HDL 74 04/19/2021   Lab Results  Component Value Date   LDLCALC 101 (H) 04/19/2021   Lab Results  Component Value Date   TRIG 128 04/19/2021   Lab Results  Component Value Date   CHOLHDL 2.7 04/19/2021   No results found for: HGBA1C     Assessment & Plan:   Problem List Items Addressed This Visit       Other   Dysuria - Primary      Appears well, in no apparent distress.  Vital signs are normal. The abdomen is soft without tenderness, guarding, mass, rebound or organomegaly. No CVA tenderness or inguinal adenopathy noted. Urine dipstick shows many bacteria, positive for leukocytes, nitrites and blood.}    UTI uncomplicated without evidence of pyelonephritis   Treatment per orders - also push fluids, may use Pyridium OTC prn. Call or return to clinic prn if these symptoms worsen or fail to improve as anticipated.   Started patient on Bactrim DS, completed urine cultures results pending.  Rx sent to pharmacy.      Relevant Medications   sulfamethoxazole-trimethoprim (BACTRIM DS) 800-160 MG tablet   Other Relevant Orders   Urinalysis, Routine w reflex microscopic   CULTURE, URINE COMPREHENSIVE     Meds ordered this encounter  Medications   sulfamethoxazole-trimethoprim (BACTRIM  DS) 800-160 MG tablet    Sig: Take 1 tablet by mouth 2 (two) times daily.    Dispense:  14 tablet    Refill:  0    Order Specific Question:   Supervising Provider    Answer:   Claretta Fraise [[id number]]     Ivy Lynn, NP

## 2021-09-06 NOTE — Patient Instructions (Signed)

## 2021-09-06 NOTE — Assessment & Plan Note (Signed)
°   Appears well, in no apparent distress.  Vital signs are normal. The abdomen is soft without tenderness, guarding, mass, rebound or organomegaly. No CVA tenderness or inguinal adenopathy noted. Urine dipstick shows many bacteria, positive for leukocytes, nitrites and blood.}    UTI uncomplicated without evidence of pyelonephritis   Treatment per orders - also push fluids, may use Pyridium OTC prn. Call or return to clinic prn if these symptoms worsen or fail to improve as anticipated.   Started patient on Bactrim DS, completed urine cultures results pending.  Rx sent to pharmacy.

## 2021-09-08 ENCOUNTER — Other Ambulatory Visit: Payer: Self-pay | Admitting: Family Medicine

## 2021-09-11 LAB — CULTURE, URINE COMPREHENSIVE

## 2021-09-12 ENCOUNTER — Telehealth: Payer: Self-pay

## 2021-09-12 ENCOUNTER — Ambulatory Visit
Admission: RE | Admit: 2021-09-12 | Discharge: 2021-09-12 | Disposition: A | Discharge status: Home / Self Care | Payer: Medicare Other | Source: Ambulatory Visit | Attending: Gastroenterology | Admitting: Gastroenterology

## 2021-09-12 DIAGNOSIS — K219 Gastro-esophageal reflux disease without esophagitis: Secondary | ICD-10-CM | POA: Diagnosis not present

## 2021-09-12 DIAGNOSIS — K449 Diaphragmatic hernia without obstruction or gangrene: Secondary | ICD-10-CM | POA: Diagnosis not present

## 2021-09-12 DIAGNOSIS — K224 Dyskinesia of esophagus: Secondary | ICD-10-CM | POA: Diagnosis not present

## 2021-09-12 DIAGNOSIS — R1310 Dysphagia, unspecified: Secondary | ICD-10-CM

## 2021-09-12 NOTE — Telephone Encounter (Signed)
Pt made aware that placard is complete. She will come pick up on  09/12/21

## 2021-09-12 NOTE — Telephone Encounter (Signed)
Patient needs new handicap form filled out and would like to pick up tomorrow if possible.  Please advise.

## 2021-09-12 NOTE — Telephone Encounter (Signed)
Can you fill out a handicap form for her that I can sign, arthritis being the diagnosis.

## 2021-09-27 DIAGNOSIS — M1712 Unilateral primary osteoarthritis, left knee: Secondary | ICD-10-CM | POA: Diagnosis not present

## 2021-10-03 ENCOUNTER — Other Ambulatory Visit: Payer: Self-pay | Admitting: Family Medicine

## 2021-10-04 ENCOUNTER — Other Ambulatory Visit: Payer: Self-pay | Admitting: Gastroenterology

## 2021-10-04 ENCOUNTER — Other Ambulatory Visit (HOSPITAL_COMMUNITY): Payer: Self-pay | Admitting: Gastroenterology

## 2021-10-04 DIAGNOSIS — R1011 Right upper quadrant pain: Secondary | ICD-10-CM

## 2021-10-04 DIAGNOSIS — K219 Gastro-esophageal reflux disease without esophagitis: Secondary | ICD-10-CM | POA: Diagnosis not present

## 2021-10-12 ENCOUNTER — Ambulatory Visit (HOSPITAL_COMMUNITY)
Admission: RE | Admit: 2021-10-12 | Discharge: 2021-10-12 | Disposition: A | Discharge status: Home / Self Care | Payer: Medicare Other | Source: Ambulatory Visit | Attending: Gastroenterology | Admitting: Gastroenterology

## 2021-10-12 ENCOUNTER — Other Ambulatory Visit: Payer: Self-pay

## 2021-10-12 DIAGNOSIS — R1011 Right upper quadrant pain: Secondary | ICD-10-CM | POA: Diagnosis not present

## 2021-10-12 DIAGNOSIS — K219 Gastro-esophageal reflux disease without esophagitis: Secondary | ICD-10-CM | POA: Diagnosis not present

## 2021-10-12 MED ORDER — TECHNETIUM TC 99M MEBROFENIN IV KIT
5.3000 | PACK | Freq: Once | INTRAVENOUS | Status: AC | PRN
  Administered 2021-10-12: 5.3 via INTRAVENOUS

## 2021-10-17 ENCOUNTER — Other Ambulatory Visit: Payer: Self-pay | Admitting: Family Medicine

## 2021-10-17 DIAGNOSIS — F411 Generalized anxiety disorder: Secondary | ICD-10-CM

## 2021-10-20 ENCOUNTER — Ambulatory Visit (INDEPENDENT_AMBULATORY_CARE_PROVIDER_SITE_OTHER): Payer: Medicare Other | Admitting: Family Medicine

## 2021-10-20 ENCOUNTER — Encounter: Payer: Self-pay | Admitting: Family Medicine

## 2021-10-20 ENCOUNTER — Ambulatory Visit: Payer: Medicare Other | Admitting: Family Medicine

## 2021-10-20 VITALS — BP 134/66 | HR 94 | Ht 64.0 in | Wt 157.0 lb

## 2021-10-20 DIAGNOSIS — I10 Essential (primary) hypertension: Secondary | ICD-10-CM | POA: Diagnosis not present

## 2021-10-20 DIAGNOSIS — Z0000 Encounter for general adult medical examination without abnormal findings: Secondary | ICD-10-CM | POA: Diagnosis not present

## 2021-10-20 DIAGNOSIS — Z79899 Other long term (current) drug therapy: Secondary | ICD-10-CM

## 2021-10-20 DIAGNOSIS — E782 Mixed hyperlipidemia: Secondary | ICD-10-CM

## 2021-10-20 DIAGNOSIS — Z01818 Encounter for other preprocedural examination: Secondary | ICD-10-CM | POA: Diagnosis not present

## 2021-10-20 DIAGNOSIS — F411 Generalized anxiety disorder: Secondary | ICD-10-CM | POA: Diagnosis not present

## 2021-10-20 DIAGNOSIS — E039 Hypothyroidism, unspecified: Secondary | ICD-10-CM | POA: Diagnosis not present

## 2021-10-20 LAB — COAGUCHEK XS/INR WAIVED
INR: 0.9 (ref 0.9–1.1)
Prothrombin Time: 13 s

## 2021-10-20 MED ORDER — LEVOTHYROXINE SODIUM 75 MCG PO TABS: 75.0000 ug | ORAL_TABLET | Freq: Every day | ORAL | 3 refills | Status: DC

## 2021-10-20 MED ORDER — ALPRAZOLAM 0.25 MG PO TABS: 0.2500 mg | ORAL_TABLET | Freq: Every evening | ORAL | 5 refills | Status: DC | PRN

## 2021-10-20 MED ORDER — HYDROCHLOROTHIAZIDE 12.5 MG PO CAPS: 12.5000 mg | ORAL_CAPSULE | Freq: Every day | ORAL | 3 refills | Status: DC

## 2021-10-20 MED ORDER — ROSUVASTATIN CALCIUM 10 MG PO TABS: 10.0000 mg | ORAL_TABLET | Freq: Every day | ORAL | 3 refills | Status: DC

## 2021-10-20 MED ORDER — ESCITALOPRAM OXALATE 20 MG PO TABS: 20.0000 mg | ORAL_TABLET | Freq: Every day | ORAL | 3 refills | Status: DC

## 2021-10-20 MED ORDER — LOSARTAN POTASSIUM 25 MG PO TABS: 12.5000 mg | ORAL_TABLET | Freq: Every day | ORAL | 3 refills | Status: DC

## 2021-10-20 NOTE — Progress Notes (Signed)
BP 134/66    Pulse 94    Ht 5' 4"  (1.626 m)    Wt 157 lb (71.2 kg)    PF 97 L/min    BMI 26.95 kg/m    Subjective:   Patient ID: Isabella Lindsey, female    DOB: 19-May-1946, 76 y.o.   MRN: [id number]  HPI: Isabella Lindsey is a 76 y.o. female presenting on 10/20/2021 for Medication Management (Surgical clearance)   HPI Preoperative physical Patient is coming in for preoperative physical for left knee total arthroplasty.  She is doing the surgery with Dr. Orvan Seen  Hypothyroidism recheck Patient is coming in for thyroid recheck today as well. They deny any issues with hair changes or heat or cold problems or diarrhea or constipation. They deny any chest pain or palpitations. They are currently on levothyroxine 75 micrograms   Hypertension Patient is currently on hctz and losartan, and their blood pressure today is 134/66. Patient denies any lightheadedness or dizziness. Patient denies headaches, blurred vision, chest pains, shortness of breath, or weakness. Denies any side effects from medication and is content with current medication.   Hyperlipidemia Patient is coming in for recheck of his hyperlipidemia. The patient is currently taking crestor. They deny any issues with myalgias or history of liver damage from it. They deny any focal numbness or weakness or chest pain.   Anxiety Current rx- xanax 0.25 mg qhs # meds rx-30 Effectiveness of current meds-works well Adverse reactions form meds-none  Pill count performed-No Last drug screen -09/30/2020 ( high risk q63m moderate risk q663mlow risk yearly ) Urine drug screen today- Yes Was the [place]yes  If yes were their any concerning findings? -None  No flowsheet data found.   Controlled substance contract signed on: Today  Relevant past medical, surgical, family and social history reviewed and updated as indicated. Interim medical history since our last visit reviewed. Allergies and medications reviewed and  updated.  Review of Systems  Constitutional:  Negative for chills and fever.  HENT:  Negative for congestion, ear discharge and ear pain.   Eyes:  Negative for visual disturbance.  Respiratory:  Negative for chest tightness and shortness of breath.   Cardiovascular:  Negative for chest pain and leg swelling.  Genitourinary:  Negative for difficulty urinating and dysuria.  Musculoskeletal:  Negative for back pain and gait problem.  Skin:  Negative for rash.  Neurological:  Negative for dizziness, light-headedness and headaches.  Psychiatric/Behavioral:  Negative for agitation and behavioral problems. The patient is nervous/anxious.   All other systems reviewed and are negative.  Per HPI unless specifically indicated above   Allergies as of 10/20/2021       Reactions   Effexor [venlafaxine]    Dizziness   Penicillins    Erythromycin Rash        Medication List        Accurate as of October 20, 2021  3:25 PM. If you have any questions, ask your nurse or doctor.          STOP taking these medications    sulfamethoxazole-trimethoprim 800-160 MG tablet Commonly known as: Bactrim DS Stopped by: JoFransisca Kaufmannettinger, MD       TAKE these medications    ALPRAZolam 0.25 MG tablet Commonly known as: XANAX Take 1 tablet (0.25 mg total) by mouth at bedtime as needed for anxiety.   escitalopram 20 MG tablet Commonly known as: LEXAPRO Take 1 tablet (20 mg total) by mouth daily.  hydrochlorothiazide 12.5 MG capsule Commonly known as: MICROZIDE Take 1 capsule (12.5 mg total) by mouth daily.   levothyroxine 75 MCG tablet Commonly known as: SYNTHROID Take 1 tablet (75 mcg total) by mouth daily.   losartan 25 MG tablet Commonly known as: COZAAR Take 0.5 tablets (12.5 mg total) by mouth daily.   pantoprazole 40 MG tablet Commonly known as: PROTONIX Take 40 mg by mouth daily.   rosuvastatin 10 MG tablet Commonly known as: CRESTOR Take 1 tablet (10 mg total) by  mouth daily.   terbinafine 250 MG tablet Commonly known as: LAMISIL Take 250 mg by mouth daily.         Objective:   BP 134/66    Pulse 94    Ht 5' 4"  (1.626 m)    Wt 157 lb (71.2 kg)    PF 97 L/min    BMI 26.95 kg/m   Wt Readings from Last 3 Encounters:  10/20/21 157 lb (71.2 kg)  09/06/21 162 lb 4 oz (73.6 kg)  04/19/21 154 lb (69.9 kg)    Physical Exam Vitals and nursing note reviewed.  Constitutional:      General: She is not in acute distress.    Appearance: She is well-developed. She is not diaphoretic.  Eyes:     Conjunctiva/sclera: Conjunctivae normal.  Cardiovascular:     Rate and Rhythm: Normal rate and regular rhythm.     Heart sounds: Normal heart sounds. No murmur heard. Pulmonary:     Effort: Pulmonary effort is normal. No respiratory distress.     Breath sounds: Normal breath sounds. No wheezing.  Musculoskeletal:        General: No swelling or tenderness. Normal range of motion.  Skin:    General: Skin is warm and dry.     Findings: No rash.  Neurological:     Mental Status: She is alert and oriented to person, place, and time.     Coordination: Coordination normal.  Psychiatric:        Behavior: Behavior normal.      Assessment & Plan:   Problem List Items Addressed This Visit       Cardiovascular and Mediastinum   Hypertension   Relevant Medications   hydrochlorothiazide (MICROZIDE) 12.5 MG capsule   losartan (COZAAR) 25 MG tablet   rosuvastatin (CRESTOR) 10 MG tablet     Endocrine   Hypothyroidism   Relevant Medications   levothyroxine (SYNTHROID) 75 MCG tablet   Other Relevant Orders   TSH     Other   Hyperlipidemia   Relevant Medications   hydrochlorothiazide (MICROZIDE) 12.5 MG capsule   losartan (COZAAR) 25 MG tablet   rosuvastatin (CRESTOR) 10 MG tablet   GAD (generalized anxiety disorder)   Relevant Medications   ALPRAZolam (XANAX) 0.25 MG tablet   escitalopram (LEXAPRO) 20 MG tablet   Preoperative clearance    Relevant Orders   EKG 12-Lead (Completed)   CoaguChek XS/INR Waived   Other Visit Diagnoses     Health care maintenance    -  Primary   Relevant Orders   Lipid panel   CMP14+EGFR   CBC with Differential/Platelet   CoaguChek XS/INR Waived   Controlled substance agreement signed       Relevant Orders   ToxASSURE Select 13 (MW), Urine     Continue current medicine.  If blood work looks good then we will clear her for surgery.  Follow up plan: Return in about 6 months (around 04/19/2022), or if symptoms worsen or  fail to improve, for Hypertension and hyperlipidemia.  Counseling provided for all of the vaccine components Orders Placed This Encounter  Procedures   ToxASSURE Select 13 (MW), Urine   Lipid panel   CMP14+EGFR   CBC with Differential/Platelet   TSH   CoaguChek XS/INR Waived   EKG 12-Lead    Caryl Pina, MD [facility] 10/20/2021, 3:25 PM

## 2021-10-21 LAB — CBC WITH DIFFERENTIAL/PLATELET
Basophils Absolute: 0.1 10*3/uL (ref 0.0–0.2)
Basos: 1 %
EOS (ABSOLUTE): 0.1 10*3/uL (ref 0.0–0.4)
Eos: 1 %
Hematocrit: 37 % (ref 34.0–46.6)
Hemoglobin: 11.8 g/dL (ref 11.1–15.9)
Immature Grans (Abs): 0 10*3/uL (ref 0.0–0.1)
Immature Granulocytes: 0 %
Lymphocytes Absolute: 2.3 10*3/uL (ref 0.7–3.1)
Lymphs: 26 %
MCH: 25.7 pg — ABNORMAL LOW (ref 26.6–33.0)
MCHC: 31.9 g/dL (ref 31.5–35.7)
MCV: 80 fL (ref 79–97)
Monocytes Absolute: 0.6 10*3/uL (ref 0.1–0.9)
Monocytes: 6 %
Neutrophils Absolute: 5.9 10*3/uL (ref 1.4–7.0)
Neutrophils: 66 %
Platelets: 327 10*3/uL (ref 150–450)
RBC: 4.6 x10E6/uL (ref 3.77–5.28)
RDW: 15 % (ref 11.7–15.4)
WBC: 8.9 10*3/uL (ref 3.4–10.8)

## 2021-10-21 LAB — CMP14+EGFR
ALT: 12 IU/L (ref 0–32)
AST: 19 IU/L (ref 0–40)
Albumin/Globulin Ratio: 1.5 (ref 1.2–2.2)
Albumin: 4.4 g/dL (ref 3.7–4.7)
Alkaline Phosphatase: 66 IU/L (ref 44–121)
BUN/Creatinine Ratio: 16 (ref 12–28)
BUN: 22 mg/dL (ref 8–27)
Bilirubin Total: 0.3 mg/dL (ref 0.0–1.2)
CO2: 23 mmol/L (ref 20–29)
Calcium: 10.4 mg/dL — ABNORMAL HIGH (ref 8.7–10.3)
Chloride: 99 mmol/L (ref 96–106)
Creatinine, Ser: 1.4 mg/dL — ABNORMAL HIGH (ref 0.57–1.00)
Globulin, Total: 2.9 g/dL (ref 1.5–4.5)
Glucose: 99 mg/dL (ref 70–99)
Potassium: 3.5 mmol/L (ref 3.5–5.2)
Sodium: 139 mmol/L (ref 134–144)
Total Protein: 7.3 g/dL (ref 6.0–8.5)
eGFR: 39 mL/min/{1.73_m2} — ABNORMAL LOW (ref >59)

## 2021-10-21 LAB — LIPID PANEL
Chol/HDL Ratio: 2.6 ratio (ref 0.0–4.4)
Cholesterol, Total: 171 mg/dL (ref 100–199)
HDL: 66 mg/dL (ref >39)
LDL Chol Calc (NIH): 80 mg/dL (ref 0–99)
Triglycerides: 148 mg/dL (ref 0–149)
VLDL Cholesterol Cal: 25 mg/dL (ref 5–40)

## 2021-10-21 LAB — TSH: TSH: 3.06 u[IU]/mL (ref 0.450–4.500)

## 2021-10-29 LAB — TOXASSURE SELECT 13 (MW), URINE

## 2021-11-02 ENCOUNTER — Telehealth: Payer: Self-pay

## 2021-11-02 NOTE — Telephone Encounter (Signed)
Faxed surgical clearance form, recent OV notes, labs and EKG to [facility] [phone] ?

## 2021-11-03 DIAGNOSIS — H40033 Anatomical narrow angle, bilateral: Secondary | ICD-10-CM | POA: Diagnosis not present

## 2021-11-03 DIAGNOSIS — H2513 Age-related nuclear cataract, bilateral: Secondary | ICD-10-CM | POA: Diagnosis not present

## 2021-11-09 DIAGNOSIS — K2930 Chronic superficial gastritis without bleeding: Secondary | ICD-10-CM | POA: Diagnosis not present

## 2021-11-09 DIAGNOSIS — R1013 Epigastric pain: Secondary | ICD-10-CM | POA: Diagnosis not present

## 2021-11-09 DIAGNOSIS — K449 Diaphragmatic hernia without obstruction or gangrene: Secondary | ICD-10-CM | POA: Diagnosis not present

## 2021-11-09 DIAGNOSIS — R1310 Dysphagia, unspecified: Secondary | ICD-10-CM | POA: Diagnosis not present

## 2021-11-09 DIAGNOSIS — K219 Gastro-esophageal reflux disease without esophagitis: Secondary | ICD-10-CM | POA: Diagnosis not present

## 2021-11-16 DIAGNOSIS — K2930 Chronic superficial gastritis without bleeding: Secondary | ICD-10-CM | POA: Diagnosis not present

## 2021-11-25 ENCOUNTER — Ambulatory Visit (INDEPENDENT_AMBULATORY_CARE_PROVIDER_SITE_OTHER): Payer: Medicare Other | Admitting: Family Medicine

## 2021-11-25 ENCOUNTER — Encounter: Payer: Self-pay | Admitting: Family Medicine

## 2021-11-25 VITALS — BP 118/65 | HR 87 | Temp 98.2°F | Ht 64.0 in | Wt 157.0 lb

## 2021-11-25 DIAGNOSIS — H6592 Unspecified nonsuppurative otitis media, left ear: Secondary | ICD-10-CM

## 2021-11-25 DIAGNOSIS — Z23 Encounter for immunization: Secondary | ICD-10-CM

## 2021-11-25 MED ORDER — FLUTICASONE PROPIONATE 50 MCG/ACT NA SUSP: 2.0000 | Freq: Every day | NASAL | 6 refills | Status: DC

## 2021-11-25 NOTE — Progress Notes (Addendum)
? ?Acute Office Visit ? ?Subjective:  ? ? Patient ID: Isabella Lindsey, female    DOB: 1946-05-11, 76 y.o.   MRN: [id number] ? ?Chief Complaint  ?Patient presents with  ? Otalgia  ? ? ?HPI ?Patient is in today for left ear pain for 3 weeks. The pain is intermittent. The pain is like a pressure and there is a lot of popping. She reports chronic nasal congestion. She denies fever, chills, cough, sore throat, chills, or myalgias. She has not tried anything for her symptoms.  ? ? ?She would like a shingles vaccine today.  ? ? ?Past Medical History:  ?Diagnosis Date  ? Allergic rhinitis   ? Depression   ? DJD (degenerative joint disease)   ? cervical and Rt. knee, Rt. shoulder rotator cuff tendonitis  ? Dyslipidemia   ? Dyspepsia   ? H/O esophageal reflux   ? w/hiatal hernia  ? H/O radioactive iodine thyroid ablation 09/23/2010  ? 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 Hypertension   ? Insomnia   ? Occassional  ? Migraine   ? Seborrheic keratoses   ? Scattered seborrheic keratoses  ? ? ?Past Surgical History:  ?Procedure Laterality Date  ? ABDOMINAL HYSTERECTOMY  1986  ? ? ?Family History  ?Problem Relation Age of Onset  ? Cancer Father   ?     lung  ? Arthritis Sister   ? Breast cancer Neg Hx   ? ? ?Social History  ? ?Socioeconomic History  ? Marital status: Married  ?  Spouse name: Lynann Bologna  ? Number of children: 3  ? Years of education: Not on file  ? Highest education level: 12th grade  ?Occupational History  ? Occupation: Retired  ?Tobacco Use  ? Smoking status: Never  ? Smokeless tobacco: Never  ?Vaping Use  ? Vaping Use: Never used  ?Substance and Sexual Activity  ? Alcohol use: Never  ? Drug use: Never  ? Sexual activity: Not Currently  ?Other Topics Concern  ? Not on file  ?Social History Narrative  ? Lives at home with husband. One son lives in [place].  ? ?Social Determinants of Health  ? ?Financial Resource Strain: Low Risk   ? Difficulty of Paying Living Expenses: Not hard at all  ?Food  Insecurity: No Food Insecurity  ? Worried About Charity fundraiser in the Last Year: Never true  ? Ran Out of Food in the Last Year: Never true  ?Transportation Needs: No Transportation Needs  ? Lack of Transportation (Medical): No  ? Lack of Transportation (Non-Medical): No  ?Physical Activity: Insufficiently Active  ? Days of Exercise per Week: 7 days  ? Minutes of Exercise per Session: 10 min  ?Stress: No Stress Concern Present  ? Feeling of Stress : Only a little  ?Social Connections: Socially Integrated  ? Frequency of Communication with Friends and Family: More than three times a week  ? Frequency of Social Gatherings with Friends and Family: More than three times a week  ? Attends Religious Services: More than 4 times per year  ? Active Member of Clubs or Organizations: Yes  ? Attends Archivist Meetings: More than 4 times per year  ? Marital Status: Married  ?Intimate Partner Violence: Not At Risk  ? Fear of Current or Ex-Partner: No  ? Emotionally Abused: No  ? Physically Abused: No  ? Sexually Abused: No  ? ? ?Outpatient Medications Prior to Visit  ?Medication Sig Dispense Refill  ? ALPRAZolam (  XANAX) 0.25 MG tablet Take 1 tablet (0.25 mg total) by mouth at bedtime as needed for anxiety. 30 tablet 5  ? escitalopram (LEXAPRO) 20 MG tablet Take 1 tablet (20 mg total) by mouth daily. 90 tablet 3  ? hydrochlorothiazide (MICROZIDE) 12.5 MG capsule Take 1 capsule (12.5 mg total) by mouth daily. 90 capsule 3  ? levothyroxine (SYNTHROID) 75 MCG tablet Take 1 tablet (75 mcg total) by mouth daily. 90 tablet 3  ? losartan (COZAAR) 25 MG tablet Take 0.5 tablets (12.5 mg total) by mouth daily. 45 tablet 3  ? pantoprazole (PROTONIX) 40 MG tablet Take 40 mg by mouth daily.    ? rosuvastatin (CRESTOR) 10 MG tablet Take 1 tablet (10 mg total) by mouth daily. 90 tablet 3  ? terbinafine (LAMISIL) 250 MG tablet Take 250 mg by mouth daily.    ? ?No facility-administered medications prior to visit.  ? ? ?Allergies   ?Allergen Reactions  ? Effexor [Venlafaxine]   ?  Dizziness  ? Penicillins   ? Erythromycin Rash  ? ? ?Review of Systems ?As per HPI.  ?   ?Objective:  ?  ?Physical Exam ?Vitals and nursing note reviewed.  ?Constitutional:   ?   General: She is not in acute distress. ?   Appearance: She is not ill-appearing, toxic-appearing or diaphoretic.  ?HENT:  ?   Right Ear: Ear canal and external ear normal. No middle ear effusion. Tympanic membrane is not perforated, erythematous, retracted or bulging.  ?   Left Ear: Ear canal and external ear normal. A middle ear effusion is present. Tympanic membrane is not perforated, erythematous, retracted or bulging.  ?   Nose: Congestion present.  ?   Mouth/Throat:  ?   Mouth: Mucous membranes are dry.  ?   Pharynx: Oropharynx is clear.  ?Eyes:  ?   Conjunctiva/sclera: Conjunctivae normal.  ?   Pupils: Pupils are equal, round, and reactive to light.  ?Cardiovascular:  ?   Rate and Rhythm: Normal rate and regular rhythm.  ?   Heart sounds: Normal heart sounds. No murmur heard. ?Pulmonary:  ?   Effort: Pulmonary effort is normal. No respiratory distress.  ?   Breath sounds: Normal breath sounds.  ?Skin: ?   General: Skin is warm and dry.  ?Neurological:  ?   Mental Status: She is alert and oriented to person, place, and time.  ?Psychiatric:     ?   Mood and Affect: Mood normal.     ?   Behavior: Behavior normal.  ? ? ?BP 118/65   Pulse 87   Temp 98.2 ?F (36.8 ?C) (Temporal)   Ht 5' 4"  (1.626 m)   Wt 157 lb (71.2 kg)   BMI 26.95 kg/m?  ?Wt Readings from Last 3 Encounters:  ?11/25/21 157 lb (71.2 kg)  ?10/20/21 157 lb (71.2 kg)  ?09/06/21 162 lb 4 oz (73.6 kg)  ? ? ?Health Maintenance Due  ?Topic Date Due  ? Zoster Vaccines- Shingrix (1 of 2) Never done  ? ? ?There are no preventive care reminders to display for this patient. ? ? ?Lab Results  ?Component Value Date  ? TSH 3.060 10/20/2021  ? ?Lab Results  ?Component Value Date  ? WBC 8.9 10/20/2021  ? HGB 11.8 10/20/2021  ? HCT 37.0  10/20/2021  ? MCV 80 10/20/2021  ? PLT 327 10/20/2021  ? ?Lab Results  ?Component Value Date  ? NA 139 10/20/2021  ? K 3.5 10/20/2021  ? CO2 23  10/20/2021  ? GLUCOSE 99 10/20/2021  ? BUN 22 10/20/2021  ? CREATININE 1.40 (H) 10/20/2021  ? BILITOT 0.3 10/20/2021  ? ALKPHOS 66 10/20/2021  ? AST 19 10/20/2021  ? ALT 12 10/20/2021  ? PROT 7.3 10/20/2021  ? ALBUMIN 4.4 10/20/2021  ? CALCIUM 10.4 (H) 10/20/2021  ? ANIONGAP 12 11/29/2017  ? EGFR 39 (L) 10/20/2021  ? ?Lab Results  ?Component Value Date  ? CHOL 171 10/20/2021  ? ?Lab Results  ?Component Value Date  ? HDL 66 10/20/2021  ? ?Lab Results  ?Component Value Date  ? Driscoll 80 10/20/2021  ? ?Lab Results  ?Component Value Date  ? TRIG 148 10/20/2021  ? ?Lab Results  ?Component Value Date  ? CHOLHDL 2.6 10/20/2021  ? ?No results found for: HGBA1C ? ?   ?Assessment & Plan:  ? ?Isabella Lindsey was seen today for otalgia. ? ?Diagnoses and all orders for this visit: ? ?Fluid level behind tympanic membrane of left ear ?No infection today. Flonase as below. Tylenol prn for pain.  ?-     fluticasone (FLONASE) 50 MCG/ACT nasal spray; Place 2 sprays into both nostrils daily. ? ?Immunization due ?-     Varicella-zoster vaccine IM (Shingrix) ? ? ? ?Return to office for new or worsening symptoms, or if symptoms persist.  ? ?The patient indicates understanding of these issues and agrees with the plan. ? ?Gwenlyn Perking, FNP ? ?

## 2021-11-25 NOTE — Patient Instructions (Signed)
° °Otitis Media With Effusion, Adult °Otitis media with effusion (OME) is inflammation and fluid (effusion) in the middle ear without having an ear infection. The middle ear is the space behind the eardrum. The middle ear is connected to the back of the throat by a narrow tube (eustachian tube). Normally the eustachian tube drains fluid out of the middle ear. A swollen eustachian tube can become blocked and cause fluid to collect in the middle ear. °OME often goes away without treatment. Sometimes OME can lead to hearing problems and recurrent acute ear infections (acute otitis media). These conditions may require treatment. °What are the causes? °OME is caused by a blocked eustachian tube. This can result from: °Allergies. °Upper respiratory infections. °Enlarged adenoids. The adenoids are areas of soft tissue located high in the back of the throat, behind the nose and the roof of the mouth. They are part of the body's natural defense system (immune system). °Rapid changes in pressure, like when an airplane is descending or during scuba diving. °In some cases, the cause of this condition is not known. °What are the signs or symptoms? °Common symptoms of this condition include: °A feeling of fullness in your ear. °Decreased hearing in the affected ear. °Fluid draining into the ear canal. °Pain in the ear. °In some cases, there are no symptoms. °How is this diagnosed? °A health care provider can diagnose OME based on signs and symptoms of the condition. Your provider will also do a physical exam to check for fluid behind the eardrum. During the exam, your health care provider will use an instrument called an otoscope to look in your ear. °Your health care provider may do other tests, such as: °A hearing test. °A tympanogram. This is a test that shows how well the eardrum moves in response to air pressure in the ear canal. It provides a graph for your health care provider to review. °A pneumatic otoscopy. This is a  test to check how your eardrum moves in response to changes in pressure. It is done by squeezing a small amount of air into the ear. °How is this treated? °Treatment for OME depends on the cause of the condition and the severity of symptoms. The first step is often waiting to see if the fluid drains on its own in a few weeks. Home care treatment may include: °Over-the-counter pain relievers. °A warm, moist cloth placed over the ear. °Severe cases may require a procedure to insert tubes in the ears (tympanostomy tubes) to drain the fluid. °Follow these instructions at home: °Take over-the-counter and prescription medicines only as told by your health care provider. °Keep all follow-up visits. °Contact a health care provider if: °You have pain that gets worse. °Hearing in your affected ear gets worse. °You have fluid draining from your ear canal. °You have dizziness. °You develop a fever. °Get help right away if: °You develop a severe headache. °You completely lose hearing in the affected ear. °You have bleeding from your ear canal. °You have sudden and severe pain in your ear. °These symptoms may represent a serious problem that is an emergency. Do not wait to see if the symptoms will go away. Get medical help right away. Call your local emergency services (911 in the [place] Do not drive yourself to the hospital. °Summary °Otitis media with effusion (OME) is inflammation and fluid (effusion) in the middle ear without having an ear infection. °A swollen eustachian tube can become blocked and cause fluid to collect in the middle   ear. °Treatment for OME depends on the cause of the condition and the severity of symptoms. °Many times, treatment is not needed because the fluid drains on its own in a few weeks. °Sometimes OME can lead to hearing problems and recurrent acute ear infections (acute otitis media), which may require treatment. °This information is not intended to replace advice given to you by your health care  provider. Make sure you discuss any questions you have with your health care provider. °Document Revised: 12/16/2020 Document Reviewed: 12/16/2020 °Elsevier Patient Education © 2022 [employer] ° °

## 2021-11-25 NOTE — Addendum Note (Signed)
Addended by: Hessie Diener on: 11/25/2021 03:39 PM ? ? Modules accepted: Orders ? ?

## 2021-11-28 DIAGNOSIS — K219 Gastro-esophageal reflux disease without esophagitis: Secondary | ICD-10-CM | POA: Diagnosis not present

## 2021-11-28 DIAGNOSIS — K449 Diaphragmatic hernia without obstruction or gangrene: Secondary | ICD-10-CM | POA: Diagnosis not present

## 2021-11-28 DIAGNOSIS — R1011 Right upper quadrant pain: Secondary | ICD-10-CM | POA: Diagnosis not present

## 2021-12-21 ENCOUNTER — Ambulatory Visit (INDEPENDENT_AMBULATORY_CARE_PROVIDER_SITE_OTHER): Payer: Medicare Other | Admitting: Family Medicine

## 2021-12-21 ENCOUNTER — Encounter: Payer: Self-pay | Admitting: Family Medicine

## 2021-12-21 VITALS — BP 135/74 | HR 83 | Temp 98.8°F | Wt 156.0 lb

## 2021-12-21 DIAGNOSIS — N3000 Acute cystitis without hematuria: Secondary | ICD-10-CM

## 2021-12-21 DIAGNOSIS — R300 Dysuria: Secondary | ICD-10-CM | POA: Diagnosis not present

## 2021-12-21 LAB — URINALYSIS, ROUTINE W REFLEX MICROSCOPIC
Bilirubin, UA: NEGATIVE
Glucose, UA: NEGATIVE
Ketones, UA: NEGATIVE
Nitrite, UA: NEGATIVE
Protein,UA: NEGATIVE
RBC, UA: NEGATIVE
Specific Gravity, UA: 1.015 (ref 1.005–1.030)
Urobilinogen, Ur: 0.2 mg/dL (ref 0.2–1.0)
pH, UA: 7 (ref 5.0–7.5)

## 2021-12-21 LAB — MICROSCOPIC EXAMINATION
RBC, Urine: NONE SEEN /hpf (ref 0–2)
WBC, UA: >30 /hpf — AB (ref 0–5)

## 2021-12-21 MED ORDER — SULFAMETHOXAZOLE-TRIMETHOPRIM 800-160 MG PO TABS: 1.0000 | ORAL_TABLET | Freq: Two times a day (BID) | ORAL | 0 refills | Status: DC

## 2021-12-21 NOTE — Progress Notes (Signed)
?  ? ?Subjective:  ?Patient ID: Isabella Lindsey, female    DOB: [birth year], 76 y.o.   MRN: [id number] ? ?Patient Care Team: ?Dettinger, Fransisca Kaufmann, MD as PCP - General (Family Medicine) ?Lavera Guise, [facility] (Pharmacist) ?Rourk, Cristopher Estimable, MD as Consulting Physician (Gastroenterology)  ? ?Chief Complaint:  Dysuria ( Cloudy urine/ Burning/ Fever/X4 days) ? ? ?HPI: ?Isabella Lindsey is a 76 y.o. female presenting on 12/21/2021 for Dysuria ( Cloudy urine/ Burning/ Fever/X4 days) ? ? ?Pt presents today with complaints of dysuria, lower abdominal pressure with voiding, cloudy and malodorous urine since Monday. She has increased her water intake and started AZO with minimal to no relief of symptoms.  ? ?Dysuria  ?This is a new problem. The current episode started in the past 7 days. The problem occurs every urination. The quality of the pain is described as burning and aching. The pain is mild. There has been no fever. She is Not sexually active. There is No history of pyelonephritis. Associated symptoms include frequency and urgency. Pertinent negatives include no chills, discharge, flank pain, hematuria, hesitancy, nausea, possible pregnancy, sweats or vomiting. She has tried increased fluids and home medications for the symptoms. The treatment provided no relief.  ?  ? ? ?Relevant past medical, surgical, family, and social history reviewed and updated as indicated.  ?Allergies and medications reviewed and updated. Data reviewed: Chart in Epic. ? ? ?Past Medical History:  ?Diagnosis Date  ? Allergic rhinitis   ? Depression   ? DJD (degenerative joint disease)   ? cervical and Rt. knee, Rt. shoulder rotator cuff tendonitis  ? Dyslipidemia   ? Dyspepsia   ? H/O esophageal reflux   ? w/hiatal hernia  ? H/O radioactive iodine thyroid ablation 09/23/2010  ? 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 Hypertension   ? Insomnia   ? Occassional  ? Migraine   ? Seborrheic keratoses   ? Scattered seborrheic keratoses   ? ? ?Past Surgical History:  ?Procedure Laterality Date  ? ABDOMINAL HYSTERECTOMY  1986  ? ? ?Social History  ? ?Socioeconomic History  ? Marital status: Married  ?  Spouse name: Lynann Bologna  ? Number of children: 3  ? Years of education: Not on file  ? Highest education level: 12th grade  ?Occupational History  ? Occupation: Retired  ?Tobacco Use  ? Smoking status: Never  ? Smokeless tobacco: Never  ?Vaping Use  ? Vaping Use: Never used  ?Substance and Sexual Activity  ? Alcohol use: Never  ? Drug use: Never  ? Sexual activity: Not Currently  ?Other Topics Concern  ? Not on file  ?Social History Narrative  ? Lives at home with husband. One son lives in [place].  ? ?Social Determinants of Health  ? ?Financial Resource Strain: Low Risk   ? Difficulty of Paying Living Expenses: Not hard at all  ?Food Insecurity: No Food Insecurity  ? Worried About Charity fundraiser in the Last Year: Never true  ? Ran Out of Food in the Last Year: Never true  ?Transportation Needs: No Transportation Needs  ? Lack of Transportation (Medical): No  ? Lack of Transportation (Non-Medical): No  ?Physical Activity: Insufficiently Active  ? Days of Exercise per Week: 7 days  ? Minutes of Exercise per Session: 10 min  ?Stress: No Stress Concern Present  ? Feeling of Stress : Only a little  ?Social Connections: Socially Integrated  ? Frequency of Communication with Friends and Family: More than  three times a week  ? Frequency of Social Gatherings with Friends and Family: More than three times a week  ? Attends Religious Services: More than 4 times per year  ? Active Member of Clubs or Organizations: Yes  ? Attends Archivist Meetings: More than 4 times per year  ? Marital Status: Married  ?Intimate Partner Violence: Not At Risk  ? Fear of Current or Ex-Partner: No  ? Emotionally Abused: No  ? Physically Abused: No  ? Sexually Abused: No  ? ? ?Outpatient Encounter Medications as of 12/21/2021  ?Medication Sig  ? ALPRAZolam (XANAX) 0.25 MG  tablet Take 1 tablet (0.25 mg total) by mouth at bedtime as needed for anxiety.  ? escitalopram (LEXAPRO) 20 MG tablet Take 1 tablet (20 mg total) by mouth daily.  ? fluticasone (FLONASE) 50 MCG/ACT nasal spray Place 2 sprays into both nostrils daily.  ? hydrochlorothiazide (MICROZIDE) 12.5 MG capsule Take 1 capsule (12.5 mg total) by mouth daily.  ? levothyroxine (SYNTHROID) 75 MCG tablet Take 1 tablet (75 mcg total) by mouth daily.  ? losartan (COZAAR) 25 MG tablet Take 0.5 tablets (12.5 mg total) by mouth daily.  ? pantoprazole (PROTONIX) 40 MG tablet Take 40 mg by mouth daily.  ? rosuvastatin (CRESTOR) 10 MG tablet Take 1 tablet (10 mg total) by mouth daily.  ? sulfamethoxazole-trimethoprim (BACTRIM DS) 800-160 MG tablet Take 1 tablet by mouth 2 (two) times daily for 7 days.  ? terbinafine (LAMISIL) 250 MG tablet Take 250 mg by mouth daily.  ? ?No facility-administered encounter medications on file as of 12/21/2021.  ? ? ?Allergies  ?Allergen Reactions  ? Effexor [Venlafaxine]   ?  Dizziness  ? Penicillins   ? Erythromycin Rash  ? ? ?Review of Systems  ?Constitutional:  Negative for activity change, appetite change, chills, diaphoresis, fatigue, fever and unexpected weight change.  ?Respiratory:  Negative for cough and shortness of breath.   ?Cardiovascular:  Negative for chest pain, palpitations and leg swelling.  ?Gastrointestinal:  Positive for abdominal pain. Negative for abdominal distention, anal bleeding, blood in stool, constipation, diarrhea, nausea, rectal pain and vomiting.  ?Genitourinary:  Positive for dysuria, frequency and urgency. Negative for decreased urine volume, difficulty urinating, dyspareunia, enuresis, flank pain, hematuria, hesitancy, pelvic pain, vaginal bleeding, vaginal discharge and vaginal pain.  ?Musculoskeletal:  Negative for back pain.  ?Neurological:  Negative for weakness and headaches.  ?Psychiatric/Behavioral:  Negative for confusion.   ?All other systems reviewed and are  negative. ? ?   ? ?Objective:  ?BP 135/74   Pulse 83   Temp 98.8 ?F (37.1 ?C)   Wt 156 lb (70.8 kg)   SpO2 97%   BMI 26.78 kg/m?   ? ?Wt Readings from Last 3 Encounters:  ?12/21/21 156 lb (70.8 kg)  ?11/25/21 157 lb (71.2 kg)  ?10/20/21 157 lb (71.2 kg)  ? ? ?Physical Exam ?Vitals and nursing note reviewed.  ?Constitutional:   ?   General: She is not in acute distress. ?   Appearance: Normal appearance. She is well-developed and well-groomed. She is not ill-appearing, toxic-appearing or diaphoretic.  ?HENT:  ?   Head: Normocephalic and atraumatic.  ?   Jaw: There is normal jaw occlusion.  ?   Right Ear: Hearing normal.  ?   Left Ear: Hearing normal.  ?   Nose: Nose normal.  ?   Mouth/Throat:  ?   Lips: Pink.  ?   Mouth: Mucous membranes are moist.  ?  Pharynx: Uvula midline.  ?Eyes:  ?   General: Lids are normal.  ?   Pupils: Pupils are equal, round, and reactive to light.  ?Neck:  ?   Thyroid: No thyroid mass, thyromegaly or thyroid tenderness.  ?   Vascular: No carotid bruit or JVD.  ?   Trachea: Trachea and phonation normal.  ?Cardiovascular:  ?   Rate and Rhythm: Normal rate and regular rhythm.  ?   Chest Wall: PMI is not displaced.  ?   Pulses: Normal pulses.  ?   Heart sounds: Normal heart sounds. No murmur heard. ?  No friction rub. No gallop.  ?Pulmonary:  ?   Effort: Pulmonary effort is normal. No respiratory distress.  ?   Breath sounds: Normal breath sounds. No wheezing.  ?Abdominal:  ?   General: Bowel sounds are normal. There is no distension or abdominal bruit.  ?   Palpations: Abdomen is soft. There is no hepatomegaly, splenomegaly or mass.  ?   Tenderness: There is no abdominal tenderness. There is no right CVA tenderness, left CVA tenderness, guarding or rebound.  ?   Hernia: No hernia is present.  ?Musculoskeletal:  ?   Cervical back: Normal range of motion and neck supple.  ?Lymphadenopathy:  ?   Cervical: No cervical adenopathy.  ?Skin: ?   General: Skin is warm and dry.  ?   Capillary  Refill: Capillary refill takes less than 2 seconds.  ?   Coloration: Skin is not cyanotic, jaundiced or pale.  ?   Findings: No rash.  ?Neurological:  ?   General: No focal deficit present.  ?   Mental Status: She is

## 2021-12-23 LAB — URINE CULTURE

## 2021-12-23 MED ORDER — CIPROFLOXACIN HCL 500 MG PO TABS: 500.0000 mg | ORAL_TABLET | Freq: Two times a day (BID) | ORAL | 0 refills | Status: AC

## 2021-12-23 NOTE — Addendum Note (Signed)
Addended by: Sonny Masters on: 12/23/2021 04:50 PM ? ? Modules accepted: Orders ? ?

## 2021-12-27 NOTE — Progress Notes (Signed)
Sent message, via epic in basket, requesting orders in epic from surgeon.  

## 2022-01-02 DIAGNOSIS — M1712 Unilateral primary osteoarthritis, left knee: Secondary | ICD-10-CM | POA: Diagnosis not present

## 2022-01-02 NOTE — Progress Notes (Signed)
DUE TO COVID-19 ONLY ONE VISITOR IS ALLOWED TO COME WITH YOU AND STAY IN THE WAITING ROOM ONLY DURING PRE OP AND PROCEDURE DAY OF SURGERY.  2 VISITOR  MAY VISIT WITH YOU AFTER SURGERY IN YOUR PRIVATE ROOM DURING VISITING HOURS ONLY! ?YOU MAY HAVE ONE PERSON SPEND THE NITE WITH YOU IN YOUR ROOM AFTER SURGERY.   ? ? Your procedure is scheduled on:  ?              01/16/22   0730am  ? Report to [facility] Main  Entrance ? ? Report to admitting at     0730 am              AM ?DO NOT BRING INSURANCE CARD, PICTURE ID OR WALLET DAY OF SURGERY.  ?  ? ? Call this number if you have problems the morning of surgery [phone]  ? ? REMEMBER: NO  SOLID FOODS , CANDY, GUM OR MINTS AFTER MIDNITE THE NITE BEFORE SURGERY .       [name] CLEAR LIQUIDS UNTIL     0715am           DAY OF SURGERY.      PLEASE FINISH ENSURE DRINK PER SURGEON ORDER  WHICH NEEDS TO BE COMPLETED AT   0715am         MORNING OF SURGERY.   ? ? ? ? ?CLEAR LIQUID DIET ? ? ?Foods Allowed      ?WATER ?BLACK COFFEE ( SUGAR OK, NO MILK, CREAM OR CREAMER) REGULAR AND DECAF  ?TEA ( SUGAR OK NO MILK, CREAM, OR CREAMER) REGULAR AND DECAF  ?PLAIN JELLO ( NO RED)  ?FRUIT ICES ( NO RED, NO FRUIT PULP)  ?POPSICLES ( NO RED)  ?JUICE- APPLE, WHITE GRAPE AND WHITE CRANBERRY  ?SPORT DRINK LIKE GATORADE ( NO RED)  ?CLEAR BROTH ( VEGETABLE , CHICKEN OR BEEF)                                                               ? ?    ? ?BRUSH YOUR TEETH MORNING OF SURGERY AND RINSE YOUR MOUTH OUT, NO CHEWING GUM CANDY OR MINTS. ?  ? ? Take these medicines the morning of surgery with A SIP OF WATER:  lexapro, synthroid , protonix  ? ? ?DO NOT TAKE ANY DIABETIC MEDICATIONS DAY OF YOUR SURGERY ?                  ?            You may not have any metal on your body including hair pins and  ?            piercings  Do not wear jewelry, make-up, lotions, powders or perfumes, deodorant ?            Do not wear nail polish on your fingernails.   ?           IF YOU ARE A FEMALE AND WANT TO  SHAVE UNDER ARMS OR LEGS PRIOR TO SURGERY YOU MUST DO SO AT LEAST 48 HOURS PRIOR TO SURGERY.  ?            Men may shave face and neck. ? ? Do not bring valuables to the hospital.  [facility] IS NOT ?            RESPONSIBLE   FOR VALUABLES. ? Contacts, dentures or bridgework may not be worn into surgery. ? Leave suitcase in the car. After surgery it may be brought to your room. ? ?  ? Patients discharged the day of surgery will not be allowed to drive home. IF YOU ARE HAVING SURGERY AND GOING HOME THE SAME DAY, YOU MUST HAVE AN ADULT TO DRIVE YOU HOME AND BE WITH YOU FOR 24 HOURS. YOU MAY GO HOME BY TAXI OR UBER OR ORTHERWISE, BUT AN ADULT MUST ACCOMPANY YOU HOME AND STAY WITH YOU FOR 24 HOURS. ?  ? ?            Please read over the following fact sheets you were given: ?_____________________________________________________________________ ? ?[facility] - Preparing for Surgery ?Before surgery, you can play an important role.  Because skin is not sterile, your skin needs to be as free of germs as possible.  You can reduce the number of germs on your skin by washing with CHG (chlorahexidine gluconate) soap before surgery.  CHG is an antiseptic cleaner which kills germs and bonds with the skin to continue killing germs even after washing. ?Please DO NOT use if you have an allergy to CHG or antibacterial soaps.  If your skin becomes reddened/irritated stop using the CHG and inform your nurse when you arrive at Short Stay. ?Do not shave (including legs and underarms) for at least 48 hours prior to the first CHG shower.  You may shave your face/neck. ?Please follow these instructions carefully: ? 1.  Shower with CHG Soap the night before surgery and the  morning of Surgery. ? 2.  If you choose to wash your hair, wash your hair first as usual with your  normal  shampoo. ? 3.  After you shampoo, rinse your hair and body thoroughly to remove the  shampoo.                           4.  Use CHG as you would any other liquid  soap.  You can apply chg directly  to the skin and wash  ?                     Gently with a scrungie or clean washcloth. ? 5.  Apply the CHG Soap to your body ONLY FROM THE NECK DOWN.   Do not use on face/ open      ?                     Wound or open sores. Avoid contact with eyes, ears mouth and genitals (private parts).  ?                     Engineering geologist,  Genitals (private parts) with your normal soap. ?            6.  Wash thoroughly, paying special attention to the area where your surgery  will be performed. ? 7.  Thoroughly rinse your body with warm water from the neck down. ? 8.  DO NOT shower/wash with your normal soap after using and rinsing off  the CHG Soap. ?               9.  Pat yourself dry with a clean towel. ?  10.  Wear clean pajamas. ?           11.  Place clean sheets on your bed the night of your first shower and do not  sleep with pets. ?Day of Surgery : ?Do not apply any lotions/deodorants the morning of surgery.  Please wear clean clothes to the hospital/surgery center. ? ?FAILURE TO FOLLOW THESE INSTRUCTIONS MAY RESULT IN THE CANCELLATION OF YOUR SURGERY ?PATIENT SIGNATURE_________________________________ ? ?NURSE SIGNATURE__________________________________ ? ?________________________________________________________________________  ? ? ?           ?

## 2022-01-02 NOTE — Progress Notes (Addendum)
Anesthesia Review: ? ?PCP: Gilford Silvius, NP LOV 12/21/21  for cystitis  ?Ivin Booty Dettinger for clearance  LOV 10/20/21  ?Called and requested clearance from [employer].  They are to fax.  ?Clearance on chart Dr dettinger dated 11/02/21 along with LOV note 10/20/21  ?Cardiologist :  Hochrein  LOV 01/24/21  ?Chest x-ray : ?EKG : 10/20/21  on chart  ?Echo : ?Stress test: ?Cardiac Cath :  ?Activity level: can do a flgiht of stairs wtihout difficulty  ?Sleep Study/ CPAP : none  ?Fasting Blood Sugar :      / Checks Blood Sugar -- times a day:   ?Blood Thinner/ Instructions /Last Dose: ?ASA / Instructions/ Last Dose :   ?BMP done 01/03/22 routed to Dr Blanchie Dessert.  ?

## 2022-01-02 NOTE — H&P (Signed)
KNEE ARTHROPLASTY ADMISSION H&P ? ?Patient ID: ?Isabella RobertsonSandra L Lindsey ?MRN: [id number] ?DOB/AGE: Aug 03, 1946 76 y.o. ? ?Chief Complaint: left knee pain. ? ?Planned Procedure Date: 01/16/22 ?Medical Clearance by Dr. Louanne Skyeettinger    ?Additional clearance by Dr. Sallyanne HaversScheck - GI ? ?HPI: ?Isabella Lindsey is a 76 y.o. female who presents for evaluation of OA LEFT KNEE. The patient has a history of pain and functional disability in the left knee due to arthritis and has failed non-surgical conservative treatments for greater than 12 weeks to include NSAID's and/or analgesics, corticosteriod injections, viscosupplementation injections, and activity modification.  Onset of symptoms was gradual, starting 8 years ago with gradually worsening course since that time. The patient noted no past surgery on the left knee.  Patient currently rates pain at 8 out of 10 with activity. Patient has worsening of pain with activity and weight bearing and pain that interferes with activities of daily living.  Patient has evidence of joint space narrowing by imaging studies.  There is no active infection. ? ?Past Medical History:  ?Diagnosis Date  ? Allergic rhinitis   ? Depression   ? DJD (degenerative joint disease)   ? cervical and Rt. knee, Rt. shoulder rotator cuff tendonitis  ? Dyslipidemia   ? Dyspepsia   ? H/O esophageal reflux   ? w/hiatal hernia  ? H/O radioactive iodine thyroid ablation 09/23/2010  ? 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 Hypertension   ? Insomnia   ? Occassional  ? Migraine   ? Seborrheic keratoses   ? Scattered seborrheic keratoses  ? ?Past Surgical History:  ?Procedure Laterality Date  ? ABDOMINAL HYSTERECTOMY  1986  ? ?Allergies  ?Allergen Reactions  ? Effexor [Venlafaxine]   ?  Dizziness  ? Penicillins Itching and Swelling  ? Erythromycin Rash  ? ?Prior to Admission medications   ?Medication Sig Start Date End Date Taking? Authorizing Provider  ?ALPRAZolam (XANAX) 0.25 MG tablet Take 1 tablet (0.25 mg  total) by mouth at bedtime as needed for anxiety. 10/20/21  Yes Dettinger, Elige RadonJoshua A, MD  ?cholecalciferol (VITAMIN D3) 25 MCG (1000 UNIT) tablet Take 2,000 Units by mouth daily.   Yes [provider]  ?escitalopram (LEXAPRO) 20 MG tablet Take 1 tablet (20 mg total) by mouth daily. 10/20/21  Yes Dettinger, Elige RadonJoshua A, MD  ?hydrochlorothiazide (MICROZIDE) 12.5 MG capsule Take 1 capsule (12.5 mg total) by mouth daily. 10/20/21  Yes Dettinger, Elige RadonJoshua A, MD  ?levothyroxine (SYNTHROID) 75 MCG tablet Take 1 tablet (75 mcg total) by mouth daily. 10/20/21  Yes Dettinger, Elige RadonJoshua A, MD  ?losartan (COZAAR) 25 MG tablet Take 0.5 tablets (12.5 mg total) by mouth daily. 10/20/21  Yes Dettinger, Elige RadonJoshua A, MD  ?pantoprazole (PROTONIX) 40 MG tablet Take 40 mg by mouth daily.   Yes [provider]  ?rosuvastatin (CRESTOR) 10 MG tablet Take 1 tablet (10 mg total) by mouth daily. 10/20/21  Yes Dettinger, Elige RadonJoshua A, MD  ?fluticasone (FLONASE) 50 MCG/ACT nasal spray Place 2 sprays into both nostrils daily. ?Patient not taking: Reported on 12/29/2021 11/25/21   Gabriel EaringMorgan, Tiffany M, FNP  ? ?Social History  ? ?Socioeconomic History  ? Marital status: Married  ?  Spouse name: Lollie SailsHarry  ? Number of children: 3  ? Years of education: Not on file  ? Highest education level: 12th grade  ?Occupational History  ? Occupation: Retired  ?Tobacco Use  ? Smoking status: Never  ? Smokeless tobacco: Never  ?Vaping Use  ? Vaping Use: Never used  ?  Substance and Sexual Activity  ? Alcohol use: Never  ? Drug use: Never  ? Sexual activity: Not Currently  ?Other Topics Concern  ? Not on file  ?Social History Narrative  ? Lives at home with husband. One son lives in [place].  ? ?Social Determinants of Health  ? ?Financial Resource Strain: Low Risk   ? Difficulty of Paying Living Expenses: Not hard at all  ?Food Insecurity: No Food Insecurity  ? Worried About Programme researcher, broadcasting/film/video in the Last Year: Never true  ? Ran Out of Food in the Last Year: Never true   ?Transportation Needs: No Transportation Needs  ? Lack of Transportation (Medical): No  ? Lack of Transportation (Non-Medical): No  ?Physical Activity: Insufficiently Active  ? Days of Exercise per Week: 7 days  ? Minutes of Exercise per Session: 10 min  ?Stress: No Stress Concern Present  ? Feeling of Stress : Only a little  ?Social Connections: Socially Integrated  ? Frequency of Communication with Friends and Family: More than three times a week  ? Frequency of Social Gatherings with Friends and Family: More than three times a week  ? Attends Religious Services: More than 4 times per year  ? Active Member of Clubs or Organizations: Yes  ? Attends Banker Meetings: More than 4 times per year  ? Marital Status: Married  ? ?Family History  ?Problem Relation Age of Onset  ? Cancer Father   ?     lung  ? Arthritis Sister   ? Breast cancer Neg Hx   ? ? ?ROS: Currently denies lightheadedness, dizziness, Fever, chills, CP, SOB.   ?No personal history of DVT, PE, MI, or CVA. ?She has partial dentures which can be removed at the time of surgery.  ?All other systems have been reviewed and were otherwise currently negative with the exception of those mentioned in the HPI and as above. ? ?Objective: ?Vitals: Ht: 5\' 3"  Wt: 154 lbs Temp: 98.1 BP: 151/81 Pulse: 72 O2 100% on room air.   ?Physical Exam: ?General: Alert, NAD.  Antalgic Gait  ?HEENT: EOMI, Good Neck Extension  ?Pulm: No increased work of breathing.  Clear B/L A/P w/o crackle or wheeze.  ?CV: RRR, No m/g/r appreciated  ?GI: soft, NT, ND ?Neuro: Neuro without gross focal deficit.  Sensation intact distally ?Skin: No lesions in the area of chief complaint ?MSK/Surgical Site: left knee w/o redness or effusion.  + medial JLT. ROM 0-120.  5/5 strength in extension and flexion.  +EHL/FHL.  NVI.  Stable varus and valgus stress.  ? ? ?Imaging Review ?Plain radiographs demonstrate severe degenerative joint disease of the left knee.  ? ?The overall alignment is  varus. The bone quality appears to be adequate for age and reported activity level. ? ?Preoperative templating of the joint replacement has been completed, documented, and submitted to the Operating Room personnel in order to optimize intra-operative equipment management. ? ?Assessment: ?OA LEFT KNEE ?Active Problems: ?  * No active hospital problems. * ? ? ?Plan: ?Plan for Procedure(s): ?TOTAL KNEE ARTHROPLASTY ? ?The patient history, physical exam, clinical judgement of the provider and imaging are consistent with end stage degenerative joint disease and total joint arthroplasty is deemed medically necessary. The treatment options including medical management, injection therapy, and arthroplasty were discussed at length. The risks and benefits of Procedure(s): ?TOTAL KNEE ARTHROPLASTY were presented and reviewed.  ?The risks of nonoperative treatment, versus surgical intervention including but not limited to continued pain, aseptic  loosening, stiffness, dislocation/subluxation, infection, bleeding, nerve injury, blood clots, cardiopulmonary complications, morbidity, mortality, among others were discussed. The patient verbalizes understanding and wishes to proceed with the plan.  ?Patient is being admitted for inpatient treatment for surgery, pain control, PT, prophylactic antibiotics, VTE prophylaxis, progressive ambulation, ADL's and discharge planning.  ? ?Dental prophylaxis discussed and recommended for 2 years postoperatively. ? ?The patient does meet the criteria for TXA which will be used perioperatively.   ?ASA 81 mg BID will be used postoperatively for DVT prophylaxis in addition to SCDs, and early ambulation. ?The patient is planning to be discharged home with OPPT in care of her husband ? ? ?Armida Sans, PA-C ?01/02/2022 ?4:15 PM  ?

## 2022-01-02 NOTE — Progress Notes (Signed)
Need orders in epic. Preop on 01/03/22.  DOS- 01/16/22. Thanks.  ?

## 2022-01-03 ENCOUNTER — Encounter (HOSPITAL_COMMUNITY)
Admission: RE | Admit: 2022-01-03 | Discharge: 2022-01-03 | Disposition: A | Discharge status: Home / Self Care | Payer: Medicare Other | Source: Ambulatory Visit | Attending: Orthopedic Surgery | Admitting: Orthopedic Surgery

## 2022-01-03 ENCOUNTER — Other Ambulatory Visit: Payer: Self-pay

## 2022-01-03 ENCOUNTER — Encounter (HOSPITAL_COMMUNITY): Payer: Self-pay

## 2022-01-03 VITALS — BP 149/67 | HR 64 | Temp 97.8°F | Resp 16 | Ht 64.0 in | Wt 157.1 lb

## 2022-01-03 DIAGNOSIS — Z01812 Encounter for preprocedural laboratory examination: Secondary | ICD-10-CM | POA: Insufficient documentation

## 2022-01-03 DIAGNOSIS — Z01818 Encounter for other preprocedural examination: Secondary | ICD-10-CM

## 2022-01-03 HISTORY — DX: Gastro-esophageal reflux disease without esophagitis: K21.9

## 2022-01-03 HISTORY — DX: Personal history of urinary calculi: Z87.442

## 2022-01-03 HISTORY — DX: Hypothyroidism, unspecified: E03.9

## 2022-01-03 LAB — BASIC METABOLIC PANEL
Anion gap: 9 (ref 5–15)
BUN: 25 mg/dL — ABNORMAL HIGH (ref 8–23)
CO2: 27 mmol/L (ref 22–32)
Calcium: 9.6 mg/dL (ref 8.9–10.3)
Chloride: 103 mmol/L (ref 98–111)
Creatinine, Ser: 1.39 mg/dL — ABNORMAL HIGH (ref 0.44–1.00)
GFR, Estimated: 40 mL/min — ABNORMAL LOW (ref >60)
Glucose, Bld: 96 mg/dL (ref 70–99)
Potassium: 3.2 mmol/L — ABNORMAL LOW (ref 3.5–5.1)
Sodium: 139 mmol/L (ref 135–145)

## 2022-01-03 LAB — CBC
HCT: 36.8 % (ref 36.0–46.0)
Hemoglobin: 11.5 g/dL — ABNORMAL LOW (ref 12.0–15.0)
MCH: 26.2 pg (ref 26.0–34.0)
MCHC: 31.3 g/dL (ref 30.0–36.0)
MCV: 83.8 fL (ref 80.0–100.0)
Platelets: 331 10*3/uL (ref 150–400)
RBC: 4.39 MIL/uL (ref 3.87–5.11)
RDW: 16.3 % — ABNORMAL HIGH (ref 11.5–15.5)
WBC: 8.3 10*3/uL (ref 4.0–10.5)
nRBC: 0 % (ref 0.0–0.2)

## 2022-01-03 LAB — SURGICAL PCR SCREEN
MRSA, PCR: NEGATIVE
Staphylococcus aureus: NEGATIVE

## 2022-01-09 NOTE — Care Plan (Signed)
Ortho Bundle Case Management Note ? ?Patient Details  ?Name: Isabella Lindsey ?MRN: [id number] ?Date of Birth: 1945/10/06 ? ?   Met with patient in the office prior to surgery. She will discharge to home with family to assist. Has RW. CPM ordered. OPPT set up with [facility]. Patient and MD in agreement with plan. Choice offered              ? ? ? ?DME Arranged:  CPM ?DME Agency:  [facility] ? ?HH Arranged:    ?[place] Agency:    ? ?Additional Comments: ?Please contact me with any questions of if this plan should need to change. ? ?Mardelle Matte  [facility] Orthopaedic Specialist  [phone] ?01/09/2022, 9:28 AM ?  ?

## 2022-01-10 ENCOUNTER — Other Ambulatory Visit (HOSPITAL_COMMUNITY): Payer: Medicare Other

## 2022-01-16 ENCOUNTER — Ambulatory Visit (HOSPITAL_COMMUNITY): Payer: Medicare Other | Admitting: Physician Assistant

## 2022-01-16 ENCOUNTER — Ambulatory Visit (HOSPITAL_COMMUNITY)
Admission: RE | Admit: 2022-01-16 | Discharge: 2022-01-16 | Disposition: A | Discharge status: Home / Self Care | Payer: Medicare Other | Source: Ambulatory Visit | Attending: Orthopedic Surgery | Admitting: Orthopedic Surgery

## 2022-01-16 ENCOUNTER — Other Ambulatory Visit: Payer: Self-pay

## 2022-01-16 ENCOUNTER — Ambulatory Visit (HOSPITAL_COMMUNITY): Payer: Medicare Other

## 2022-01-16 ENCOUNTER — Encounter (HOSPITAL_COMMUNITY)
Admission: RE | Disposition: A | Discharge status: Home / Self Care | Payer: Self-pay | Source: Ambulatory Visit | Attending: Orthopedic Surgery

## 2022-01-16 ENCOUNTER — Ambulatory Visit (HOSPITAL_BASED_OUTPATIENT_CLINIC_OR_DEPARTMENT_OTHER): Payer: Medicare Other | Admitting: Certified Registered"

## 2022-01-16 ENCOUNTER — Encounter (HOSPITAL_COMMUNITY): Payer: Self-pay | Admitting: Orthopedic Surgery

## 2022-01-16 DIAGNOSIS — F32A Depression, unspecified: Secondary | ICD-10-CM | POA: Diagnosis not present

## 2022-01-16 DIAGNOSIS — G8918 Other acute postprocedural pain: Secondary | ICD-10-CM | POA: Diagnosis not present

## 2022-01-16 DIAGNOSIS — E039 Hypothyroidism, unspecified: Secondary | ICD-10-CM

## 2022-01-16 DIAGNOSIS — F418 Other specified anxiety disorders: Secondary | ICD-10-CM

## 2022-01-16 DIAGNOSIS — K219 Gastro-esophageal reflux disease without esophagitis: Secondary | ICD-10-CM | POA: Insufficient documentation

## 2022-01-16 DIAGNOSIS — I10 Essential (primary) hypertension: Secondary | ICD-10-CM | POA: Diagnosis not present

## 2022-01-16 DIAGNOSIS — F419 Anxiety disorder, unspecified: Secondary | ICD-10-CM | POA: Diagnosis not present

## 2022-01-16 DIAGNOSIS — M1712 Unilateral primary osteoarthritis, left knee: Secondary | ICD-10-CM | POA: Diagnosis not present

## 2022-01-16 DIAGNOSIS — Z96652 Presence of left artificial knee joint: Secondary | ICD-10-CM | POA: Diagnosis not present

## 2022-01-16 DIAGNOSIS — Z471 Aftercare following joint replacement surgery: Secondary | ICD-10-CM | POA: Diagnosis not present

## 2022-01-16 DIAGNOSIS — Z79899 Other long term (current) drug therapy: Secondary | ICD-10-CM | POA: Diagnosis not present

## 2022-01-16 HISTORY — PX: TOTAL KNEE ARTHROPLASTY: SHX125

## 2022-01-16 SURGERY — ARTHROPLASTY, KNEE, TOTAL
Anesthesia: Monitor Anesthesia Care | Site: Knee | Laterality: Left

## 2022-01-16 MED ORDER — WATER FOR IRRIGATION, STERILE IR SOLN
Status: DC | PRN
  Administered 2022-01-16: 2000 mL

## 2022-01-16 MED ORDER — 0.9 % SODIUM CHLORIDE (POUR BTL) OPTIME
TOPICAL | Status: DC | PRN
  Administered 2022-01-16: 1000 mL

## 2022-01-16 MED ORDER — OXYCODONE HCL 5 MG PO TABS
ORAL_TABLET | ORAL | Status: AC
  Filled 2022-01-16: qty 2

## 2022-01-16 MED ORDER — CELECOXIB 200 MG PO CAPS
400.0000 mg | ORAL_CAPSULE | Freq: Once | ORAL | Status: AC
  Administered 2022-01-16: 400 mg via ORAL
  Filled 2022-01-16: qty 2

## 2022-01-16 MED ORDER — SODIUM CHLORIDE (PF) 0.9 % IJ SOLN
INTRAMUSCULAR | Status: AC
  Filled 2022-01-16: qty 10

## 2022-01-16 MED ORDER — LACTATED RINGERS IV SOLN: INTRAVENOUS | Status: DC

## 2022-01-16 MED ORDER — POVIDONE-IODINE 7.5 % EX SOLN: Freq: Once | CUTANEOUS | Status: DC

## 2022-01-16 MED ORDER — SODIUM CHLORIDE 0.9 % IR SOLN
Status: DC | PRN
  Administered 2022-01-16: 3000 mL

## 2022-01-16 MED ORDER — PROPOFOL 500 MG/50ML IV EMUL
INTRAVENOUS | Status: DC | PRN
  Administered 2022-01-16: 125 ug/kg/min via INTRAVENOUS

## 2022-01-16 MED ORDER — DEXAMETHASONE SODIUM PHOSPHATE 10 MG/ML IJ SOLN
8.0000 mg | Freq: Once | INTRAMUSCULAR | Status: AC
  Administered 2022-01-16: 8 mg via INTRAVENOUS

## 2022-01-16 MED ORDER — SODIUM CHLORIDE (PF) 0.9 % IJ SOLN
INTRAMUSCULAR | Status: AC
  Filled 2022-01-16: qty 50

## 2022-01-16 MED ORDER — EPHEDRINE 5 MG/ML INJ
INTRAVENOUS | Status: AC
  Filled 2022-01-16: qty 5

## 2022-01-16 MED ORDER — DEXAMETHASONE SODIUM PHOSPHATE 10 MG/ML IJ SOLN
INTRAMUSCULAR | Status: DC | PRN
  Administered 2022-01-16: 10 mg

## 2022-01-16 MED ORDER — ACETAMINOPHEN 325 MG PO TABS: 325.0000 mg | ORAL_TABLET | Freq: Four times a day (QID) | ORAL | Status: DC | PRN

## 2022-01-16 MED ORDER — ASPIRIN EC 81 MG PO TBEC: 81.0000 mg | DELAYED_RELEASE_TABLET | Freq: Two times a day (BID) | ORAL | 0 refills | Status: AC

## 2022-01-16 MED ORDER — ACETAMINOPHEN 500 MG PO TABS: 1000.0000 mg | ORAL_TABLET | Freq: Three times a day (TID) | ORAL | 0 refills | Status: AC | PRN

## 2022-01-16 MED ORDER — ONDANSETRON HCL 4 MG/2ML IJ SOLN
INTRAMUSCULAR | Status: DC | PRN
  Administered 2022-01-16: 4 mg via INTRAVENOUS

## 2022-01-16 MED ORDER — CHLORHEXIDINE GLUCONATE 0.12 % MT SOLN
15.0000 mL | Freq: Once | OROMUCOSAL | Status: AC
  Administered 2022-01-16: 15 mL via OROMUCOSAL

## 2022-01-16 MED ORDER — CEFAZOLIN SODIUM-DEXTROSE 2-4 GM/100ML-% IV SOLN
2.0000 g | INTRAVENOUS | Status: AC
  Administered 2022-01-16: 2 g via INTRAVENOUS

## 2022-01-16 MED ORDER — OXYCODONE HCL 5 MG PO TABS: 5.0000 mg | ORAL_TABLET | ORAL | 0 refills | Status: AC | PRN

## 2022-01-16 MED ORDER — ACETAMINOPHEN 500 MG PO TABS
1000.0000 mg | ORAL_TABLET | Freq: Once | ORAL | Status: AC
  Administered 2022-01-16: 1000 mg via ORAL
  Filled 2022-01-16: qty 2

## 2022-01-16 MED ORDER — BUPIVACAINE LIPOSOME 1.3 % IJ SUSP
INTRAMUSCULAR | Status: DC | PRN
  Administered 2022-01-16: 20 mL

## 2022-01-16 MED ORDER — METHOCARBAMOL 500 MG IVPB - SIMPLE MED
INTRAVENOUS | Status: AC
  Filled 2022-01-16: qty 50

## 2022-01-16 MED ORDER — FENTANYL CITRATE PF 50 MCG/ML IJ SOSY
50.0000 ug | PREFILLED_SYRINGE | Freq: Once | INTRAMUSCULAR | Status: AC
  Administered 2022-01-16: 50 ug via INTRAVENOUS
  Filled 2022-01-16: qty 2

## 2022-01-16 MED ORDER — BUPIVACAINE IN DEXTROSE 0.75-8.25 % IT SOLN
INTRATHECAL | Status: DC | PRN
  Administered 2022-01-16: 1.6 mL via INTRATHECAL

## 2022-01-16 MED ORDER — ONDANSETRON HCL 4 MG PO TABS
4.0000 mg | ORAL_TABLET | Freq: Four times a day (QID) | ORAL | Status: DC | PRN
Start: 2022-01-16 — End: 2022-01-16

## 2022-01-16 MED ORDER — OXYCODONE HCL 5 MG PO TABS
5.0000 mg | ORAL_TABLET | ORAL | Status: DC | PRN
  Administered 2022-01-16: 10 mg via ORAL

## 2022-01-16 MED ORDER — LACTATED RINGERS IV BOLUS
500.0000 mL | Freq: Once | INTRAVENOUS | Status: AC
  Administered 2022-01-16: 500 mL via INTRAVENOUS

## 2022-01-16 MED ORDER — POVIDONE-IODINE 10 % EX SWAB
2.0000 "application " | Freq: Once | CUTANEOUS | Status: AC
  Administered 2022-01-16: 2 via TOPICAL

## 2022-01-16 MED ORDER — PROPOFOL 10 MG/ML IV BOLUS
INTRAVENOUS | Status: DC | PRN
  Administered 2022-01-16: 20 mg via INTRAVENOUS

## 2022-01-16 MED ORDER — HYDROMORPHONE HCL 1 MG/ML IJ SOLN: 0.5000 mg | INTRAMUSCULAR | Status: DC | PRN

## 2022-01-16 MED ORDER — CEFAZOLIN SODIUM-DEXTROSE 2-4 GM/100ML-% IV SOLN
INTRAVENOUS | Status: AC
  Filled 2022-01-16: qty 100

## 2022-01-16 MED ORDER — ONDANSETRON HCL 4 MG PO TABS: 4.0000 mg | ORAL_TABLET | Freq: Three times a day (TID) | ORAL | 0 refills | Status: AC | PRN

## 2022-01-16 MED ORDER — METHOCARBAMOL 500 MG PO TABS: 500.0000 mg | ORAL_TABLET | Freq: Four times a day (QID) | ORAL | Status: DC | PRN

## 2022-01-16 MED ORDER — BUPIVACAINE LIPOSOME 1.3 % IJ SUSP: 20.0000 mL | Freq: Once | INTRAMUSCULAR | Status: DC

## 2022-01-16 MED ORDER — ISOPROPYL ALCOHOL 70 % SOLN
Status: AC
  Filled 2022-01-16: qty 480

## 2022-01-16 MED ORDER — MIDAZOLAM HCL 2 MG/2ML IJ SOLN
1.0000 mg | Freq: Once | INTRAMUSCULAR | Status: AC
  Administered 2022-01-16: 2 mg via INTRAVENOUS
  Filled 2022-01-16: qty 2

## 2022-01-16 MED ORDER — ROPIVACAINE HCL 7.5 MG/ML IJ SOLN
INTRAMUSCULAR | Status: DC | PRN
  Administered 2022-01-16: 25 mL via PERINEURAL

## 2022-01-16 MED ORDER — POVIDONE-IODINE 10 % EX SWAB: 2.0000 "application " | Freq: Once | CUTANEOUS | Status: DC

## 2022-01-16 MED ORDER — CEFAZOLIN SODIUM-DEXTROSE 2-4 GM/100ML-% IV SOLN: 2.0000 g | Freq: Four times a day (QID) | INTRAVENOUS | Status: DC

## 2022-01-16 MED ORDER — ACETAMINOPHEN 500 MG PO TABS: 1000.0000 mg | ORAL_TABLET | Freq: Four times a day (QID) | ORAL | Status: DC

## 2022-01-16 MED ORDER — SODIUM CHLORIDE 0.9% FLUSH
INTRAVENOUS | Status: DC | PRN
  Administered 2022-01-16: 60 mL

## 2022-01-16 MED ORDER — METHOCARBAMOL 500 MG IVPB - SIMPLE MED
500.0000 mg | Freq: Four times a day (QID) | INTRAVENOUS | Status: DC | PRN
  Administered 2022-01-16: 500 mg via INTRAVENOUS

## 2022-01-16 MED ORDER — ORAL CARE MOUTH RINSE: 15.0000 mL | Freq: Once | OROMUCOSAL | Status: AC

## 2022-01-16 MED ORDER — LACTATED RINGERS IV BOLUS
250.0000 mL | Freq: Once | INTRAVENOUS | Status: AC
  Administered 2022-01-16: 250 mL via INTRAVENOUS

## 2022-01-16 MED ORDER — BUPIVACAINE LIPOSOME 1.3 % IJ SUSP
INTRAMUSCULAR | Status: AC
  Filled 2022-01-16: qty 20

## 2022-01-16 MED ORDER — EPHEDRINE SULFATE-NACL 50-0.9 MG/10ML-% IV SOSY
PREFILLED_SYRINGE | INTRAVENOUS | Status: DC | PRN
  Administered 2022-01-16: 5 mg via INTRAVENOUS
  Administered 2022-01-16: 10 mg via INTRAVENOUS
  Administered 2022-01-16 (×2): 5 mg via INTRAVENOUS

## 2022-01-16 MED ORDER — TRANEXAMIC ACID-NACL 1000-0.7 MG/100ML-% IV SOLN
1000.0000 mg | INTRAVENOUS | Status: AC
  Administered 2022-01-16: 1000 mg via INTRAVENOUS

## 2022-01-16 MED ORDER — ONDANSETRON HCL 4 MG/2ML IJ SOLN: 4.0000 mg | Freq: Four times a day (QID) | INTRAMUSCULAR | Status: DC | PRN

## 2022-01-16 SURGICAL SUPPLY — 63 items
ADH SKN CLS APL DERMABOND .7 (GAUZE/BANDAGES/DRESSINGS) ×1
APL PRP STRL LF DISP 70% ISPRP (MISCELLANEOUS) ×2
BAG COUNTER SPONGE SURGICOUNT (BAG) ×1 IMPLANT
BAG SPNG CNTER NS LX DISP (BAG) ×1
BLADE SAG 18X100X1.27 (BLADE) ×2 IMPLANT
BLADE SAW SAG 35X64 .89 (BLADE) ×2 IMPLANT
BNDG CMPR 5X3 CHSV STRCH STRL (GAUZE/BANDAGES/DRESSINGS) ×1
BNDG CMPR MED 10X6 ELC LF (GAUZE/BANDAGES/DRESSINGS) ×1
BNDG COHESIVE 3X5 TAN ST LF (GAUZE/BANDAGES/DRESSINGS) ×2 IMPLANT
BNDG ELASTIC 6X10 VLCR STRL LF (GAUZE/BANDAGES/DRESSINGS) ×2 IMPLANT
BOWL SMART MIX CTS (DISPOSABLE) ×2 IMPLANT
BSPLAT TIB 5D D CMNT STM LT (Knees) ×1 IMPLANT
CEMENT BONE R 1X40 (Cement) ×4 IMPLANT
CHLORAPREP W/TINT 26 (MISCELLANEOUS) ×4 IMPLANT
COVER SURGICAL LIGHT HANDLE (MISCELLANEOUS) ×2 IMPLANT
CUFF TOURN SGL QUICK 34 (TOURNIQUET CUFF) ×2
CUFF TRNQT CYL 34X4.125X (TOURNIQUET CUFF) ×1 IMPLANT
DERMABOND ADVANCED (GAUZE/BANDAGES/DRESSINGS) ×1
DERMABOND ADVANCED .7 DNX12 (GAUZE/BANDAGES/DRESSINGS) ×1 IMPLANT
DRAPE INCISE IOBAN 85X60 (DRAPES) ×2 IMPLANT
DRAPE SHEET LG 3/4 BI-LAMINATE (DRAPES) ×2 IMPLANT
DRAPE U-SHAPE 47X51 STRL (DRAPES) ×2 IMPLANT
DRESSING AQUACEL AG SP 3.5X10 (GAUZE/BANDAGES/DRESSINGS) ×1 IMPLANT
DRSG AQUACEL AG SP 3.5X10 (GAUZE/BANDAGES/DRESSINGS) ×2
ELECT REM PT RETURN 15FT ADLT (MISCELLANEOUS) ×2 IMPLANT
GAUZE SPONGE 4X4 12PLY STRL (GAUZE/BANDAGES/DRESSINGS) ×2 IMPLANT
GLOVE BIOGEL PI IND STRL 8 (GLOVE) ×1 IMPLANT
GLOVE BIOGEL PI INDICATOR 8 (GLOVE) ×1
GLOVE SURG ORTHO 8.0 STRL STRW (GLOVE) ×4 IMPLANT
GOWN STRL REUS W/ TWL XL LVL3 (GOWN DISPOSABLE) ×1 IMPLANT
GOWN STRL REUS W/TWL XL LVL3 (GOWN DISPOSABLE) ×2
HANDPIECE INTERPULSE COAX TIP (DISPOSABLE) ×2
HDLS TROCR DRIL PIN KNEE 75 (PIN) ×2
HOLDER FOLEY CATH W/STRAP (MISCELLANEOUS) ×2 IMPLANT
HOOD PEEL AWAY FLYTE STAYCOOL (MISCELLANEOUS) ×6 IMPLANT
INSERT TIB ASF 14 6-7/CD LT (Insert) ×1 IMPLANT
KNEE SYSTEM FEMUR SZ 6 LT (Knees) ×1 IMPLANT
MANIFOLD NEPTUNE II (INSTRUMENTS) ×2 IMPLANT
MARKER SKIN DUAL TIP RULER LAB (MISCELLANEOUS) ×2 IMPLANT
NS IRRIG 1000ML POUR BTL (IV SOLUTION) ×2 IMPLANT
PACK TOTAL KNEE CUSTOM (KITS) ×2 IMPLANT
PIN DRILL HDLS TROCAR 75 4PK (PIN) IMPLANT
PROTECTOR NERVE ULNAR (MISCELLANEOUS) ×1 IMPLANT
SCREW HEADED 33MM KNEE (MISCELLANEOUS) ×3 IMPLANT
SET HNDPC FAN SPRY TIP SCT (DISPOSABLE) ×1 IMPLANT
SOLUTION IRRIG SURGIPHOR (IV SOLUTION) IMPLANT
SPIKE FLUID TRANSFER (MISCELLANEOUS) ×2 IMPLANT
STEM POLY PAT PLY 29M KNEE (Knees) ×1 IMPLANT
STEM TIBIA 5 DEG SZ D L KNEE (Knees) IMPLANT
STRIP CLOSURE SKIN 1/2X4 (GAUZE/BANDAGES/DRESSINGS) ×2 IMPLANT
SUT MNCRL AB 3-0 PS2 18 (SUTURE) ×2 IMPLANT
SUT STRATAFIX 0 PDS 27 VIOLET (SUTURE) ×2
SUT STRATAFIX PDO 1 14 VIOLET (SUTURE) ×2
SUT STRATFX PDO 1 14 VIOLET (SUTURE) ×1
SUT VIC AB 2-0 CT2 27 (SUTURE) ×4 IMPLANT
SUTURE STRATFX 0 PDS 27 VIOLET (SUTURE) ×1 IMPLANT
SUTURE STRATFX PDO 1 14 VIOLET (SUTURE) ×1 IMPLANT
SYR 50ML LL SCALE MARK (SYRINGE) ×2 IMPLANT
TIBIA STEM 5 DEG SZ D L KNEE (Knees) ×2 IMPLANT
TRAY FOLEY MTR SLVR 14FR STAT (SET/KITS/TRAYS/PACK) ×1 IMPLANT
TUBE SUCTION HIGH CAP CLEAR NV (SUCTIONS) ×2 IMPLANT
UNDERPAD 30X36 HEAVY ABSORB (UNDERPADS AND DIAPERS) ×2 IMPLANT
WRAP KNEE MAXI GEL POST OP (GAUZE/BANDAGES/DRESSINGS) ×1 IMPLANT

## 2022-01-16 NOTE — Anesthesia Postprocedure Evaluation (Signed)
Anesthesia Post Note ? ?Patient: Isabella Lindsey ? ?Procedure(s) Performed: TOTAL KNEE ARTHROPLASTY (Left: Knee) ? ?  ? ?Patient location during evaluation: PACU ?Anesthesia Type: Regional, Spinal and MAC ?Level of consciousness: awake and alert ?Pain management: pain level controlled ?Vital Signs Assessment: post-procedure vital signs reviewed and stable ?Respiratory status: spontaneous breathing, nonlabored ventilation, respiratory function stable and patient connected to nasal cannula oxygen ?Cardiovascular status: blood pressure returned to baseline and stable ?Postop Assessment: no apparent nausea or vomiting ?Anesthetic complications: no ? ? ?No notable events documented. ? ?Last Vitals:  ?Vitals:  ? 01/16/22 1110 01/16/22 1125  ?BP: 119/62 (!) 117/57  ?Pulse: 85 85  ?Resp: 13   ?Temp: 36.9 ?C 37 ?C  ?SpO2: 95% 93%  ?  ?Last Pain:  ?Vitals:  ? 01/16/22 1228  ?TempSrc:   ?PainSc: 9   ? ? ?  ?  ?  ?  ?  ?  ? ?Khylie Larmore ? ? ? ? ?

## 2022-01-16 NOTE — Interval H&P Note (Signed)
The patient has been re-examined, and the chart reviewed, and there have been no interval changes to the documented history and physical.   ? ?Plan for L TKA today ? ?The operative side was examined and the patient was confirmed to have. Sens DPN, SPN, TN intact, Motor EHL, ext, flex 5/5, and DP 2+, PT 2+, No significant edema. ? ? ?The risks, benefits, and alternatives have been discussed at length with patient, and the patient is willing to proceed.  Left knee marked. Consent has been signed. ? ?

## 2022-01-16 NOTE — Anesthesia Preprocedure Evaluation (Signed)
Anesthesia Evaluation  ?Patient identified by MRN, date of birth, ID band ?Patient awake ? ? ? ?Reviewed: ?Allergy & Precautions, H&P , NPO status , Patient's Chart, lab work & pertinent test results, reviewed documented beta blocker date and time  ? ?Airway ?Mallampati: II ? ?TM Distance: >3 FB ?Neck ROM: full ? ? ? Dental ?no notable dental hx. ? ?  ?Pulmonary ?neg pulmonary ROS,  ?  ?Pulmonary exam normal ?breath sounds clear to auscultation ? ? ? ? ? ? Cardiovascular ?Exercise Tolerance: Good ?hypertension, Pt. on medications ?negative cardio ROS ? ? ?Rhythm:regular Rate:Normal ? ? ?  ?Neuro/Psych ? Headaches, PSYCHIATRIC DISORDERS Anxiety Depression   ? GI/Hepatic ?Neg liver ROS, GERD  Medicated,  ?Endo/Other  ?Hypothyroidism  ? Renal/GU ?negative Renal ROS  ?negative genitourinary ?  ?Musculoskeletal ? ?(+) Arthritis , Osteoarthritis,   ? Abdominal ?  ?Peds ? Hematology ?negative hematology ROS ?(+)   ?Anesthesia Other Findings ? ? Reproductive/Obstetrics ?negative OB ROS ? ?  ? ? ? ? ? ? ? ? ? ? ? ? ? ?  ?  ? ? ? ? ? ? ? ? ?Anesthesia Physical ?Anesthesia Plan ? ?ASA: 3 ? ?Anesthesia Plan: MAC, Regional and Spinal  ? ?Post-op Pain Management: Minimal or no pain anticipated and Regional block*  ? ?Induction: Intravenous ? ?PONV Risk Score and Plan: 2 and Treatment may vary due to age or medical condition and Propofol infusion ? ?Airway Management Planned: Nasal Cannula, Natural Airway and Simple Face Mask ? ?Additional Equipment: None ? ?Intra-op Plan:  ? ?Post-operative Plan:  ? ?Informed Consent: I have reviewed the patients History and Physical, chart, labs and discussed the procedure including the risks, benefits and alternatives for the proposed anesthesia with the patient or authorized representative who has indicated his/her understanding and acceptance.  ? ? ? ?Dental Advisory Given ? ?Plan Discussed with: CRNA and Anesthesiologist ? ?Anesthesia Plan Comments:    ? ? ? ? ? ? ?Anesthesia Quick Evaluation ? ?

## 2022-01-16 NOTE — Discharge Instructions (Signed)
INSTRUCTIONS AFTER JOINT REPLACEMENT  ? ?Remove items at home which could result in a fall. This includes throw rugs or furniture in walking pathways ?ICE to the affected joint every three hours while awake for 30 minutes at a time, for at least the first 3-5 days, and then as needed for pain and swelling.  Continue to use ice for pain and swelling. You may notice swelling that will progress down to the foot and ankle.  This is normal after surgery.  Elevate your leg when you are not up walking on it.   ?Continue to use the breathing machine you got in the hospital (incentive spirometer) which will help keep your temperature down.  It is common for your temperature to cycle up and down following surgery, especially at night when you are not up moving around and exerting yourself.  The breathing machine keeps your lungs expanded and your temperature down. ? ? ?DIET:  As you were doing prior to hospitalization, we recommend a well-balanced diet. ? ?DRESSING / WOUND CARE / SHOWERING ? ?Keep the surgical dressing until follow up.  The dressing is water proof, so you can shower without any extra covering.  IF THE DRESSING FALLS OFF or the wound gets wet inside, change the dressing with sterile gauze.  Please use good hand washing techniques before changing the dressing.  Do not use any lotions or creams on the incision until instructed by your surgeon.   ? ?ACTIVITY ? ?Increase activity slowly as tolerated, but follow the weight bearing instructions below.   ?No driving for 6 weeks or until further direction given by your physician.  You cannot drive while taking narcotics.  ?No lifting or carrying greater than 10 lbs. until further directed by your surgeon. ?Avoid periods of inactivity such as sitting longer than an hour when not asleep. This helps prevent blood clots.  ?You may return to work once you are authorized by your doctor.  ? ? ? ?WEIGHT BEARING  ? ?Weight bearing as tolerated with assist device (walker, cane,  etc) as directed, use it as long as suggested by your surgeon or therapist, typically at least 4-6 weeks. ? ? ?EXERCISES ? ?Results after joint replacement surgery are often greatly improved when you follow the exercise, range of motion and muscle strengthening exercises prescribed by your doctor. Safety measures are also important to protect the joint from further injury. Any time any of these exercises cause you to have increased pain or swelling, decrease what you are doing until you are comfortable again and then slowly increase them. If you have problems or questions, call your caregiver or physical therapist for advice.  ? ?Rehabilitation is important following a joint replacement. After just a few days of immobilization, the muscles of the leg can become weakened and shrink (atrophy).  These exercises are designed to build up the tone and strength of the thigh and leg muscles and to improve motion. Often times heat used for twenty to thirty minutes before working out will loosen up your tissues and help with improving the range of motion but do not use heat for the first two weeks following surgery (sometimes heat can increase post-operative swelling).  ? ?These exercises can be done on a training (exercise) mat, on the floor, on a table or on a bed. Use whatever works the best and is most comfortable for you.    Use music or television while you are exercising so that the exercises are a pleasant break in your   day. This will make your life better with the exercises acting as a break in your routine that you can look forward to.   Perform all exercises about fifteen times, three times per day or as directed.  You should exercise both the operative leg and the other leg as well. ? ?Exercises include: ?  ?Quad Sets - Tighten up the muscle on the front of the thigh (Quad) and hold for 5-10 seconds.   ?Straight Leg Raises - With your knee straight (if you were given a brace, keep it on), lift the leg to 60  degrees, hold for 3 seconds, and slowly lower the leg.  Perform this exercise against resistance later as your leg gets stronger.  ?Leg Slides: Lying on your back, slowly slide your foot toward your buttocks, bending your knee up off the floor (only go as far as is comfortable). Then slowly slide your foot back down until your leg is flat on the floor again.  ?Angel Wings: Lying on your back spread your legs to the side as far apart as you can without causing discomfort.  ?Hamstring Strength:  Lying on your back, push your heel against the floor with your leg straight by tightening up the muscles of your buttocks.  Repeat, but this time bend your knee to a comfortable angle, and push your heel against the floor.  You may put a pillow under the heel to make it more comfortable if necessary.  ? ?A rehabilitation program following joint replacement surgery can speed recovery and prevent re-injury in the future due to weakened muscles. Contact your doctor or a physical therapist for more information on knee rehabilitation.  ? ? ?CONSTIPATION ? ?Constipation is defined medically as fewer than three stools per week and severe constipation as less than one stool per week.  Even if you have a regular bowel pattern at home, your normal regimen is likely to be disrupted due to multiple reasons following surgery.  Combination of anesthesia, postoperative narcotics, change in appetite and fluid intake all can affect your bowels.  ? ?YOU MUST use at least one of the following options; they are listed in order of increasing strength to get the job done.  They are all available over the counter, and you may need to use some, POSSIBLY even all of these options:   ? ?Drink plenty of fluids (prune juice may be helpful) and high fiber foods ?Colace 100 mg by mouth twice a day  ?Senokot for constipation as directed and as needed Dulcolax (bisacodyl), take with full glass of water  ?Miralax (polyethylene glycol) once or twice a day as  needed. ? ?If you have tried all these things and are unable to have a bowel movement in the first 3-4 days after surgery call either your surgeon or your primary doctor.   ? ?If you experience loose stools or diarrhea, hold the medications until you stool forms back up.  If your symptoms do not get better within 1 week or if they get worse, check with your doctor.  If you experience "the worst abdominal pain ever" or develop nausea or vomiting, please contact the office immediately for further recommendations for treatment. ? ? ?ITCHING:  If you experience itching with your medications, try taking only a single pain pill, or even half a pain pill at a time.  You can also use Benadryl over the counter for itching or also to help with sleep.  ? ?TED HOSE STOCKINGS:  Use stockings on both   legs until for at least 2 weeks or as directed by physician office. They may be removed at night for sleeping. ? ?MEDICATIONS:  See your medication summary on the ?After Visit Summary? that nursing will review with you.  You may have some home medications which will be placed on hold until you complete the course of blood thinner medication.  It is important for you to complete the blood thinner medication as prescribed. ? ? ?Blood clot prevention (DVT Prophylaxis): After surgery you are at an increased risk for a blood clot. you were prescribed a blood thinner, Aspirin 81mg, to be taken twice daily for a total of 4 weeks from surgery to help reduce your risk of getting a blood clot. This will help prevent a blood clot. Signs of a pulmonary embolus (blood clot in the lungs) include sudden short of breath, feeling lightheaded or dizzy, chest pain with a deep breath, rapid pulse rapid breathing. Signs of a blood clot in your arms or legs include new unexplained swelling and cramping, warm, red or darkened skin around the painful area. Please call the office or 911 right away if these signs or symptoms develop. ? ?PRECAUTIONS:  If you  experience chest pain or shortness of breath - call 911 immediately for transfer to the hospital emergency department.  ? ?If you develop a fever greater that 101 F, purulent drainage from wound, increased r

## 2022-01-16 NOTE — Transfer of Care (Signed)
Immediate Anesthesia Transfer of Care Note ? ?Patient: Isabella Lindsey ? ?Procedure(s) Performed: TOTAL KNEE ARTHROPLASTY (Left: Knee) ? ?Patient Location: PACU ? ?Anesthesia Type:Regional and Spinal ? ?Level of Consciousness: awake, alert  and oriented ? ?Airway & Oxygen Therapy: Patient Spontanous Breathing and Patient connected to face mask oxygen ? ?Post-op Assessment: Report given to RN and Post -op Vital signs reviewed and stable ? ?Post vital signs: Reviewed and stable ? ?Last Vitals:  ?Vitals Value Taken Time  ?BP 113/57 01/16/22 1026  ?Temp 36.9 ?C 01/16/22 1026  ?Pulse 85 01/16/22 1029  ?Resp 16 01/16/22 1029  ?SpO2 95 % 01/16/22 1029  ?Vitals shown include unvalidated device data. ? ?Last Pain:  ?Vitals:  ? 01/16/22 0755  ?TempSrc:   ?PainSc: 0-No pain  ?   ? ?  ? ?Complications: No notable events documented. ?

## 2022-01-16 NOTE — Op Note (Signed)
DATE OF SURGERY:  01/16/2022 ?TIME: 9:57 AM ? ?PATIENT NAME:  Isabella Lindsey   ?AGE: 76 y.o.  ? ? ?PRE-OPERATIVE DIAGNOSIS: End-stage left knee osteoarthritis ? ?POST-OPERATIVE DIAGNOSIS:  Same ? ?PROCEDURE:  Left Total Knee Arthroplasty ? ?SURGEON:  Willaim Sheng, MD  ? ?ASSISTANT: Izola Price, RNFA, present and scrubbed throughout the case, critical for assistance with exposure, retraction, instrumentation, and closure. ? ? ?OPERATIVE IMPLANTS:  ?Cemented Zimmer persona femur size 6 narrow, tibia D, 29 patella, 14 mm MC poly ? ?  ?PREOPERATIVE INDICATIONS: ? ?Isabella Lindsey is a 76 y.o. year old female with end stage bone on bone degenerative arthritis of the knee who failed conservative treatment, including injections, antiinflammatories, activity modification, and assistive devices, and had significant impairment of their activities of daily living, and elected for Total Knee Arthroplasty.  ? ?The risks, benefits, and alternatives were discussed at length including but not limited to the risks of infection, bleeding, nerve injury, stiffness, blood clots, the need for revision surgery, cardiopulmonary complications, among others, and they were willing to proceed. ? ?ESTIMATED BLOOD LOSS: 25cc ? ?OPERATIVE DESCRIPTION: ? ? Once adequate anesthesia, preoperative antibiotics, 2 gm of ancef,1 gm of Tranexamic Acid, and 8 mg of Decadron administered, the patient was positioned supine with a left thigh tourniquet placed.  The left lower extremity was prepped and draped in sterile fashion.  A time-  out was performed identifying the patient, planned procedure, and the appropriate extremity.  ?   ?The leg was  exsanguinated, tourniquet elevated to 250 mmHg.  A midline incision was  made followed by median parapatellar arthrotomy. Anterior horn of the medial meniscus was released and resected. A medial release was performed, the infrapatellar fat pad was resected with care taken to protect the patellar tendon.  The suprapatellar fat was removed to exposed the distal anterior femur. The anterior horn of the lateral meniscus and ACL were released.   ? ?Following initial  exposure, attention was first to the femur.  The femoral  ? canal was opened with a drill, canal was suctioned to try to prevent fat emboli.  An  ? intramedullary rod was passed set at 5 degrees valgus, 10 mm. The distal femur was resected.  Following this resection, the tibia was  ? subluxated anteriorly.  Using the extramedullary guide, 10 mm of bone was resected off  ? the proximal lateral tibia.  We confirmed the gap would be  ? stable medially and laterally with a size 39mm spacer block as well as confirmed that the tibial cut was perpendicular in the coronal plane, checking with an alignment rod.  ? ? Once this was done, the posterior femoral referencing femoral sizer was placed under to the posterior condyles with 3 degrees of external rotational which was parallel to the transepicondylar axis and perpendicular to [employer]. The femur was sized to be a size 6 in the anterior-  posterior dimension. The  ? anterior, posterior, and  chamfer cuts were made without difficulty nor  ? notching making certain that I was along the anterior cortex to help with flexion gap stability.  ?Next a laminar spreader was placed with the knee in flexion and the medial lateral menisci were resected.  5 cc of the Exparel mixture was injected in the medial side of the back of the knee and 3 cc in the lateral side.  1/2 inch curved osteotome was used to resect posterior osteophyte that was then removed with a pituitary rongeur.  ?    ?  At this point, the tibia was sized to be a size D.  The size D tray was  ? then pinned in position. Trial reduction was now carried with a 6 femur, ? D tibia, a 12 mm MC insert.  The knee had full extension and was stable to varus valgus stress in extension.  The knee was slightly tight in flexion and the PCL was partially released.   ? ?Attention was next directed to the patella.  Precut  measurement was noted to be 20 mm.  I resected down to 13 mm and used a  2mm patellar button to restore patellar height as well as cover the cut surface.  ? ? ? The patella lug holes were drilled and a 29 mm patella poly trial was placed. ? ?  The knee was brought to full extension with good flexion stability with the patella  ? tracking through the trochlea without application of pressure.   ? ? ?Next the femoral component was again assessed and determined to be seated and appropriately lateralized.  The femoral lug holes were drilled.  The femoral component was then removed.Tibial component was again assessed and felt to be seated and appropriately rotated with the medial third of the tubercle. The tibia was then drilled, and keel punched.   ? ? Final components were  opened and regular cement was mixed.   ?   ?Final implants were then  cemented onto cleaned and dried cut surfaces of bone with the knee brought to extension with a 13 mm MC poly.  The knee was irrigated with sterile Betadine diluted in saline as well as pulse lavage normal saline.  The synovial lining was  then injected a dilute Exparel.  ?   ? Once the cement had fully cured, excess cement was removed  ? throughout the knee.  Still felt had about 2 to 3 mm laxity with varus valgus stress in extension.  Trialed a 14 mm MC poly which reduced laxity to about 1 to 2 mm.  Still had full extension still had good stability in flexion.  I confirmed that I was satisfied with the range of  ? motion and stability, and the final 14 mm MC poly insert was chosen.  It was  ? placed into the knee.  ?   ?   ? The tourniquet had been let down.  No significant  hemostasis was required.  The medial parapatellar arthrotomy was then reapproximated using #1 Stratafix sutures with the knee  in flexion.  The  ? remaining wound was closed with 0 stratafix, 2-0 Vicryl, and running 3-0 Monocryl.  ? The knee was  cleaned, dried, dressed sterilely using Dermabond and  ? Aquacel dressing.  The patient was then  brought to recovery room in stable condition, tolerating the procedure  well. There were no complications. ? ? ?Post op recs: ?WB: WBAT ?Abx: ancef x23 hours post op ?Imaging: PACU xrays ?DVT prophylaxis: Aspirin 81mg  BID x4 weeks ?Follow up: 2 weeks after surgery for a wound check with Dr. Zachery Dakins at [facility].  ?Address: [address] [place], [place], [place] [address]  ?Office Phone: [phone] ? ?Charlies Constable, MD ?Orthopaedic Surgery ? ? ? ? ? ? ? ? ? ? ? ? ?  ?

## 2022-01-16 NOTE — Anesthesia Procedure Notes (Signed)
Spinal ? ?Patient location during procedure: OR ?Start time: 01/16/2022 8:10 AM ?End time: 01/16/2022 8:15 AM ?Reason for block: surgical anesthesia ?Staffing ?Performed: resident/CRNA  ?Anesthesiologist: Bethena Midget, MD ?Resident/CRNA: Atilano Median, CRNA ?Preanesthetic Checklist ?Completed: patient identified, IV checked, site marked, risks and benefits discussed, surgical consent, monitors and equipment checked, pre-op evaluation and timeout performed ?Spinal Block ?Patient position: sitting ?Prep: DuraPrep ?Patient monitoring: heart rate, cardiac monitor, continuous pulse ox and blood pressure ?Approach: midline ?Location: L4-5 ?Injection technique: single-shot ?Needle ?Needle type: Sprotte  ?Needle gauge: 24 G ?Needle length: 9 cm ?Assessment ?Sensory level: T4 ?Events: CSF return ? ? ? ?

## 2022-01-16 NOTE — Progress Notes (Signed)
Orthopedic Tech Progress Note ?Patient Details:  ?Isabella Lindsey ?[birth year] ?[id number] ? ?Ortho Devices ?Type of Ortho Device: Bone foam zero knee ?Ortho Device/Splint Interventions: Application ?  ?Post Interventions ?Patient Tolerated: Well ?Instructions Provided: Care of device ? ?Saul Fordyce ?01/16/2022, 10:37 AM ? ?

## 2022-01-16 NOTE — Anesthesia Procedure Notes (Addendum)
Anesthesia Regional Block: Adductor canal block  ? ?Pre-Anesthetic Checklist: , timeout performed,  Correct Patient, Correct Site, Correct Laterality,  Correct Procedure, Correct Position, site marked,  Risks and benefits discussed,  Surgical consent,  Pre-op evaluation,  At surgeon's request and post-op pain management ? ?Laterality: Left ? ?Prep: chloraprep     ?  ?Needles:  ?Injection technique: Single-shot ? ?Needle Type: Echogenic Stimulator Needle   ? ? ?Needle Length: 5cm  ?Needle Gauge: 22  ? ? ? ?Additional Needles: ? ? ?Procedures:, nerve stimulator,,, ultrasound used (permanent image in chart),,    ?Narrative:  ?Start time: 01/16/2022 8:00 AM ?End time: 01/16/2022 8:05 AM ?Injection made incrementally with aspirations every 5 mL. ? ?Performed by: Personally  ?Anesthesiologist: Atilano Median, CRNA ? ?Additional Notes: ?Functioning IV was confirmed and monitors were applied.  A 45mm 22ga Arrow echogenic stimulator needle was used. Sterile prep and drape,hand hygiene and sterile gloves were used. Ultrasound guidance: relevant anatomy identified, needle position confirmed, local anesthetic spread visualized around nerve(s)., vascular puncture avoided.  Image printed for medical record. Negative aspiration and negative test dose prior to incremental administration of local anesthetic. The patient tolerated the procedure well. ? ? ? ? ? ?

## 2022-01-16 NOTE — Evaluation (Signed)
Physical Therapy Evaluation ?Patient Details ?Name: Isabella Lindsey ?MRN: [id number] ?DOB: 04/25/1946 ?Today's Date: 01/16/2022 ? ?History of Present Illness ? Pt is a 76yo female presenting s/p L-TKA on 01/16/22. 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 Tammra L Tackett is a 76 y.o. female POD 0 s/p L-TKA. Patient reports modified independence using SPC for community mobility at baseline. Patient is now limited by functional impairments (see PT problem list below) and requires min guard for transfers and gait with RW. Patient was able to ambulate 80 feet with RW and min guard and cues for safe walker management. Patient educated on safe sequencing for stair mobility and verbalized safe guarding position for people assisting with mobility. Patient instructed in exercises to facilitate ROM and circulation. Patient will benefit from continued skilled PT interventions to address impairments and progress towards PLOF. Patient has met mobility goals at adequate level for discharge home; will continue to follow if pt continues acute stay to progress towards Mod I goals.   ?   ? ?Recommendations for follow up therapy are one component of a multi-disciplinary discharge planning process, led by the attending physician.  Recommendations may be updated based on patient status, additional functional criteria and insurance authorization. ? ?Follow Up Recommendations Follow physician's recommendations for discharge plan and follow up therapies ? ?  ?Assistance Recommended at Discharge Set up Supervision/Assistance  ?Patient can return home with the following ? A little help with walking and/or transfers;A little help with bathing/dressing/bathroom;Assistance with cooking/housework;Assist for transportation;Help with stairs or ramp for entrance ? ?  ?Equipment Recommendations None recommended by PT (pt has recommended dme)  ?Recommendations for Other Services ?    ?  ?Functional Status  Assessment Patient has had a recent decline in their functional status and demonstrates the ability to make significant improvements in function in a reasonable and predictable amount of time.  ? ?  ?Precautions / Restrictions Precautions ?Precautions: None ?Restrictions ?Weight Bearing Restrictions: No ?Other Position/Activity Restrictions: wbat  ? ?  ? ?Mobility ? Bed Mobility ?Overal bed mobility: Needs Assistance ?Bed Mobility: Supine to Sit ?  ?  ?Supine to sit: Supervision ?  ?  ?General bed mobility comments: for safety only ?  ? ?Transfers ?Overall transfer level: Needs assistance ?Equipment used: Rolling walker (2 wheels) ?Transfers: Sit to/from Stand ?Sit to Stand: Min guard ?  ?  ?  ?  ?  ?General transfer comment: for safety only ?  ? ?Ambulation/Gait ?Ambulation/Gait assistance: Min guard ?Gait Distance (Feet): 80 Feet ?Assistive device: Rolling walker (2 wheels) ?Gait Pattern/deviations: WFL(Within Functional Limits) ?Gait velocity: decreased ?  ?  ?General Gait Details: Pt ambulated with RW and min guard assist, no physical assist required or overt LOB noted. ? ?Stairs ?Stairs: Yes ?Stairs assistance: Min guard ?Stair Management: No rails, Step to pattern, Backwards ?Number of Stairs: 2 ?General stair comments: Pt and husband instructed on safe stair technique. Pt demonstrated safe and appropriate form with VC for sequencing, no overt LOB noted. ? ?Wheelchair Mobility ?  ? ?Modified Rankin (Stroke Patients Only) ?  ? ?  ? ?Balance Overall balance assessment: Needs assistance ?Sitting-balance support: No upper extremity supported, Feet unsupported ?Sitting balance-Leahy Scale: Normal ?  ?  ?Standing balance support: Reliant on assistive device for balance, During functional activity, Bilateral upper extremity supported ?Standing balance-Leahy Scale: Poor ?  ?  ?  ?  ?  ?  ?  ?  ?  ?  ?  ?  ?   ? ? ? ?  Pertinent Vitals/Pain Pain Assessment ?Pain Assessment: 0-10 ?Pain Score: 8  ?Pain Location: L  knee ?Pain Descriptors / Indicators: Operative site guarding, Discomfort ?Pain Intervention(s): Monitored during session, Repositioned, Heat applied  ? ? ?Home Living Family/patient expects to be discharged to:: Private residence ?Living Arrangements: Spouse/significant other ?Available Help at Discharge: Family;Available 24 hours/day ?Type of Home: House ?Home Access: Stairs to enter ?Entrance Stairs-Rails: None ?Entrance Stairs-Number of Steps: 2 ?  ?Home Layout: Multi-level;Laundry or work area in basement;Full bath on main level;Able to live on main level with bedroom/bathroom ?Home Equipment: Grab bars - tub/shower;Rolling Walker (2 wheels);Cane - single point ?   ?  ?Prior Function Prior Level of Function : Independent/Modified Independent ?  ?  ?  ?  ?  ?  ?Mobility Comments: uses SPC for community mobility ?ADLs Comments: ind ?  ? ? ?Hand Dominance  ?   ? ?  ?Extremity/Trunk Assessment  ? Upper Extremity Assessment ?Upper Extremity Assessment: Overall WFL for tasks assessed ?  ? ?Lower Extremity Assessment ?Lower Extremity Assessment: RLE deficits/detail;LLE deficits/detail ?RLE Deficits / Details: MMT ank pf/df 5/5 ?RLE Sensation: WNL ?LLE Deficits / Details: MMT ank pf/df 5/5, no extensor lag noted ?LLE Sensation: WNL ?  ? ?Cervical / Trunk Assessment ?Cervical / Trunk Assessment: Normal  ?Communication  ? Communication: No difficulties  ?Cognition Arousal/Alertness: Awake/alert ?Behavior During Therapy: [facility] for tasks assessed/performed ?Overall Cognitive Status: Within Functional Limits for tasks assessed ?  ?  ?  ?  ?  ?  ?  ?  ?  ?  ?  ?  ?  ?  ?  ?  ?  ?  ?  ? ?  ?General Comments   ? ?  ?Exercises Total Joint Exercises ?Ankle Circles/Pumps: AROM, 5 reps, Both ?Quad Sets: AROM, Left, Other (comment) (1) ?Short Arc Quad: AROM, Left, Other reps (comment) (2) ?Heel Slides: AROM, Left, Other reps (comment) (2) ?Hip ABduction/ADduction: AROM, Left, Supine (2) ?Straight Leg Raises: AROM, Left, Other reps  (comment) (2)  ? ?Assessment/Plan  ?  ?PT Assessment Patient needs continued PT services  ?PT Problem List Decreased strength;Decreased range of motion;Decreased activity tolerance;Decreased mobility;Decreased balance;Decreased coordination;Pain ? ?   ?  ?PT Treatment Interventions DME instruction;Gait training;Stair training;Functional mobility training;Therapeutic activities;Therapeutic exercise;Balance training;Neuromuscular re-education;Patient/family education   ? ?PT Goals (Current goals can be found in the Care Plan section)  ?Acute Rehab PT Goals ?Patient Stated Goal: to walk better ?PT Goal Formulation: With patient ?Time For Goal Achievement: 01/23/22 ?Potential to Achieve Goals: Good ? ?  ?Frequency 7X/week ?  ? ? ?Co-evaluation   ?  ?  ?  ?  ? ? ?  ?AM-PAC PT "6 Clicks" Mobility  ?Outcome Measure Help needed turning from your back to your side while in a flat bed without using bedrails?: None ?Help needed moving from lying on your back to sitting on the side of a flat bed without using bedrails?: None ?Help needed moving to and from a bed to a chair (including a wheelchair)?: A Little ?Help needed standing up from a chair using your arms (e.g., wheelchair or bedside chair)?: A Little ?Help needed to walk in hospital room?: A Little ?Help needed climbing 3-5 steps with a railing? : A Little ?6 Click Score: 20 ? ?  ?End of Session Equipment Utilized During Treatment: Gait belt ?Activity Tolerance: Patient tolerated treatment well ?Patient left: in chair;with call bell/phone within reach;with nursing/sitter in room;with family/visitor present ?Nurse Communication: Mobility status ?PT Visit Diagnosis: Difficulty  in walking, not elsewhere classified (R26.2) ?  ? ?Time: 1159-1229 ?PT Time Calculation (min) (ACUTE ONLY): 30 min ? ? ?Charges:   PT Evaluation ?$PT Eval Low Complexity: 1 Low ?PT Treatments ?$Gait Training: 8-22 mins ?  ?   ? ?William Goldsmith, PT, DPT ?[facility] ?Office:  [phone] ?Pager: [phone] ? ?William Goldsmith ?01/16/2022, 1:34 PM ?

## 2022-01-17 ENCOUNTER — Encounter (HOSPITAL_COMMUNITY): Payer: Self-pay | Admitting: Orthopedic Surgery

## 2022-01-17 DIAGNOSIS — Z96652 Presence of left artificial knee joint: Secondary | ICD-10-CM | POA: Diagnosis not present

## 2022-01-17 DIAGNOSIS — M1712 Unilateral primary osteoarthritis, left knee: Secondary | ICD-10-CM | POA: Diagnosis not present

## 2022-01-18 DIAGNOSIS — M25562 Pain in left knee: Secondary | ICD-10-CM | POA: Diagnosis not present

## 2022-01-18 DIAGNOSIS — Z471 Aftercare following joint replacement surgery: Secondary | ICD-10-CM | POA: Diagnosis not present

## 2022-01-18 DIAGNOSIS — M6281 Muscle weakness (generalized): Secondary | ICD-10-CM | POA: Diagnosis not present

## 2022-01-20 DIAGNOSIS — M25562 Pain in left knee: Secondary | ICD-10-CM | POA: Diagnosis not present

## 2022-01-20 DIAGNOSIS — M6281 Muscle weakness (generalized): Secondary | ICD-10-CM | POA: Diagnosis not present

## 2022-01-20 DIAGNOSIS — Z471 Aftercare following joint replacement surgery: Secondary | ICD-10-CM | POA: Diagnosis not present

## 2022-01-23 DIAGNOSIS — M25562 Pain in left knee: Secondary | ICD-10-CM | POA: Diagnosis not present

## 2022-01-23 DIAGNOSIS — Z471 Aftercare following joint replacement surgery: Secondary | ICD-10-CM | POA: Diagnosis not present

## 2022-01-23 DIAGNOSIS — M6281 Muscle weakness (generalized): Secondary | ICD-10-CM | POA: Diagnosis not present

## 2022-01-26 DIAGNOSIS — Z471 Aftercare following joint replacement surgery: Secondary | ICD-10-CM | POA: Diagnosis not present

## 2022-01-26 DIAGNOSIS — M25562 Pain in left knee: Secondary | ICD-10-CM | POA: Diagnosis not present

## 2022-01-26 DIAGNOSIS — M6281 Muscle weakness (generalized): Secondary | ICD-10-CM | POA: Diagnosis not present

## 2022-01-31 DIAGNOSIS — M25562 Pain in left knee: Secondary | ICD-10-CM | POA: Diagnosis not present

## 2022-01-31 DIAGNOSIS — Z471 Aftercare following joint replacement surgery: Secondary | ICD-10-CM | POA: Diagnosis not present

## 2022-01-31 DIAGNOSIS — M6281 Muscle weakness (generalized): Secondary | ICD-10-CM | POA: Diagnosis not present

## 2022-02-02 DIAGNOSIS — M6281 Muscle weakness (generalized): Secondary | ICD-10-CM | POA: Diagnosis not present

## 2022-02-02 DIAGNOSIS — Z471 Aftercare following joint replacement surgery: Secondary | ICD-10-CM | POA: Diagnosis not present

## 2022-02-02 DIAGNOSIS — M1712 Unilateral primary osteoarthritis, left knee: Secondary | ICD-10-CM | POA: Diagnosis not present

## 2022-02-02 DIAGNOSIS — M25562 Pain in left knee: Secondary | ICD-10-CM | POA: Diagnosis not present

## 2022-02-06 DIAGNOSIS — M25562 Pain in left knee: Secondary | ICD-10-CM | POA: Diagnosis not present

## 2022-02-06 DIAGNOSIS — Z471 Aftercare following joint replacement surgery: Secondary | ICD-10-CM | POA: Diagnosis not present

## 2022-02-06 DIAGNOSIS — M6281 Muscle weakness (generalized): Secondary | ICD-10-CM | POA: Diagnosis not present

## 2022-02-09 DIAGNOSIS — M6281 Muscle weakness (generalized): Secondary | ICD-10-CM | POA: Diagnosis not present

## 2022-02-09 DIAGNOSIS — Z471 Aftercare following joint replacement surgery: Secondary | ICD-10-CM | POA: Diagnosis not present

## 2022-02-09 DIAGNOSIS — M25562 Pain in left knee: Secondary | ICD-10-CM | POA: Diagnosis not present

## 2022-02-13 DIAGNOSIS — M25562 Pain in left knee: Secondary | ICD-10-CM | POA: Diagnosis not present

## 2022-02-13 DIAGNOSIS — M6281 Muscle weakness (generalized): Secondary | ICD-10-CM | POA: Diagnosis not present

## 2022-02-13 DIAGNOSIS — Z471 Aftercare following joint replacement surgery: Secondary | ICD-10-CM | POA: Diagnosis not present

## 2022-02-14 ENCOUNTER — Ambulatory Visit (INDEPENDENT_AMBULATORY_CARE_PROVIDER_SITE_OTHER): Payer: Medicare Other

## 2022-02-14 VITALS — Wt 155.0 lb

## 2022-02-14 DIAGNOSIS — Z0000 Encounter for general adult medical examination without abnormal findings: Secondary | ICD-10-CM

## 2022-02-14 DIAGNOSIS — G43009 Migraine without aura, not intractable, without status migrainosus: Secondary | ICD-10-CM | POA: Insufficient documentation

## 2022-02-14 NOTE — Patient Instructions (Addendum)
Isabella Lindsey , Thank you for taking time to come for your Medicare Wellness Visit. I appreciate your ongoing commitment to your health goals. Please review the following plan we discussed and let me know if I can assist you in the future.   Screening recommendations/referrals: Colonoscopy: Cologuard done 06/17/2021 - repeat in 3 years Mammogram: Done 06/29/2021 - Repeat annually Bone Density: Done 02/18/2021 - Repeat every 2 years  Recommended yearly ophthalmology/optometry visit for glaucoma screening and checkup Recommended yearly dental visit for hygiene and checkup  Vaccinations: Influenza vaccine: Done 05/19/2021 - Repeat annually  Pneumococcal vaccine: Done 08/24/2014 & 09/19/2019 Tdap vaccine: Done 02/17/2021 - Repeat in 10 years  Shingles vaccine: Done 11/25/2021 - get second dose at next visit   Covid-19: Done  10/18/2019, 11/15/2019, 07/07/2020, & 06/22/2021  Advanced directives: Please bring a copy of your health care power of attorney and living will to the office to be added to your chart at your convenience.   Conditions/risks identified: Continue PT twice per week and on other days, try to get 30 minutes of exercise or walking. Also aim for 6-8 glasses of water, and 5 servings of fruits and vegetables each day.   Next appointment: Follow up in one year for your annual wellness visit    Preventive Care 65 Years and Older, Female Preventive care refers to lifestyle choices and visits with your health care provider that can promote health and wellness. What does preventive care include? A yearly physical exam. This is also called an annual well check. Dental exams once or twice a year. Routine eye exams. Ask your health care provider how often you should have your eyes checked. Personal lifestyle choices, including: Daily care of your teeth and gums. Regular physical activity. Eating a healthy diet. Avoiding tobacco and drug use. Limiting alcohol use. Practicing safe  sex. Taking low-dose aspirin every day. Taking vitamin and mineral supplements as recommended by your health care provider. What happens during an annual well check? The services and screenings done by your health care provider during your annual well check will depend on your age, overall health, lifestyle risk factors, and family history of disease. Counseling  Your health care provider may ask you questions about your: Alcohol use. Tobacco use. Drug use. Emotional well-being. Home and relationship well-being. Sexual activity. Eating habits. History of falls. Memory and ability to understand (cognition). Work and work Astronomer. Reproductive health. Screening  You may have the following tests or measurements: Height, weight, and BMI. Blood pressure. Lipid and cholesterol levels. These may be checked every 5 years, or more frequently if you are over 21 years old. Skin check. Lung cancer screening. You may have this screening every year starting at age 58 if you have a 30-pack-year history of smoking and currently smoke or have quit within the past 15 years. Fecal occult blood test (FOBT) of the stool. You may have this test every year starting at age 18. Flexible sigmoidoscopy or colonoscopy. You may have a sigmoidoscopy every 5 years or a colonoscopy every 10 years starting at age 20. Hepatitis C blood test. Hepatitis B blood test. Sexually transmitted disease (STD) testing. Diabetes screening. This is done by checking your blood sugar (glucose) after you have not eaten for a while (fasting). You may have this done every 1-3 years. Bone density scan. This is done to screen for osteoporosis. You may have this done starting at age 98. Mammogram. This may be done every 1-2 years. Talk to your health care provider  about how often you should have regular mammograms. Talk with your health care provider about your test results, treatment options, and if necessary, the need for more  tests. Vaccines  Your health care provider may recommend certain vaccines, such as: Influenza vaccine. This is recommended every year. Tetanus, diphtheria, and acellular pertussis (Tdap, Td) vaccine. You may need a Td booster every 10 years. Zoster vaccine. You may need this after age 64. Pneumococcal 13-valent conjugate (PCV13) vaccine. One dose is recommended after age 51. Pneumococcal polysaccharide (PPSV23) vaccine. One dose is recommended after age 8. Talk to your health care provider about which screenings and vaccines you need and how often you need them. This information is not intended to replace advice given to you by your health care provider. Make sure you discuss any questions you have with your health care provider. Document Released: 09/17/2015 Document Revised: 05/10/2016 Document Reviewed: 06/22/2015 Elsevier Interactive Patient Education  2017 [employer] Prevention in the Home Falls can cause injuries. They can happen to people of all ages. There are many things you can do to make your home safe and to help prevent falls. What can I do on the outside of my home? Regularly fix the edges of walkways and driveways and fix any cracks. Remove anything that might make you trip as you walk through a door, such as a raised step or threshold. Trim any bushes or trees on the path to your home. Use bright outdoor lighting. Clear any walking paths of anything that might make someone trip, such as rocks or tools. Regularly check to see if handrails are loose or broken. Make sure that both sides of any steps have handrails. Any raised decks and porches should have guardrails on the edges. Have any leaves, snow, or ice cleared regularly. Use sand or salt on walking paths during winter. Clean up any spills in your garage right away. This includes oil or grease spills. What can I do in the bathroom? Use night lights. Install grab bars by the toilet and in the tub and shower.  Do not use towel bars as grab bars. Use non-skid mats or decals in the tub or shower. If you need to sit down in the shower, use a plastic, non-slip stool. Keep the floor dry. Clean up any water that spills on the floor as soon as it happens. Remove soap buildup in the tub or shower regularly. Attach bath mats securely with double-sided non-slip rug tape. Do not have throw rugs and other things on the floor that can make you trip. What can I do in the bedroom? Use night lights. Make sure that you have a light by your bed that is easy to reach. Do not use any sheets or blankets that are too big for your bed. They should not hang down onto the floor. Have a firm chair that has side arms. You can use this for support while you get dressed. Do not have throw rugs and other things on the floor that can make you trip. What can I do in the kitchen? Clean up any spills right away. Avoid walking on wet floors. Keep items that you use a lot in easy-to-reach places. If you need to reach something above you, use a strong step stool that has a grab bar. Keep electrical cords out of the way. Do not use floor polish or wax that makes floors slippery. If you must use wax, use non-skid floor wax. Do not have throw rugs and  other things on the floor that can make you trip. What can I do with my stairs? Do not leave any items on the stairs. Make sure that there are handrails on both sides of the stairs and use them. Fix handrails that are broken or loose. Make sure that handrails are as long as the stairways. Check any carpeting to make sure that it is firmly attached to the stairs. Fix any carpet that is loose or worn. Avoid having throw rugs at the top or bottom of the stairs. If you do have throw rugs, attach them to the floor with carpet tape. Make sure that you have a light switch at the top of the stairs and the bottom of the stairs. If you do not have them, ask someone to add them for you. What else  can I do to help prevent falls? Wear shoes that: Do not have high heels. Have rubber bottoms. Are comfortable and fit you well. Are closed at the toe. Do not wear sandals. If you use a stepladder: Make sure that it is fully opened. Do not climb a closed stepladder. Make sure that both sides of the stepladder are locked into place. Ask someone to hold it for you, if possible. Clearly mark and make sure that you can see: Any grab bars or handrails. First and last steps. Where the edge of each step is. Use tools that help you move around (mobility aids) if they are needed. These include: Canes. Walkers. Scooters. Crutches. Turn on the lights when you go into a dark area. Replace any light bulbs as soon as they burn out. Set up your furniture so you have a clear path. Avoid moving your furniture around. If any of your floors are uneven, fix them. If there are any pets around you, be aware of where they are. Review your medicines with your doctor. Some medicines can make you feel dizzy. This can increase your chance of falling. Ask your doctor what other things that you can do to help prevent falls. This information is not intended to replace advice given to you by your health care provider. Make sure you discuss any questions you have with your health care provider. Document Released: 06/17/2009 Document Revised: 01/27/2016 Document Reviewed: 09/25/2014 Elsevier Interactive Patient Education  2017 [employer].

## 2022-02-14 NOTE — Progress Notes (Cosign Needed)
Subjective:   Isabella Lindsey is a 76 y.o. female who presents for Medicare Annual (Subsequent) preventive examination.  Virtual Visit via Telephone Note  I connected with  Isabella Lindsey on 02/14/22 at 10:30 AM EDT by telephone and verified that I am speaking with the correct person using two identifiers.  Location: Patient: Home Provider: [facility] Persons participating in the virtual visit: patient/Nurse Health Advisor   I discussed the limitations, risks, security and privacy concerns of performing an evaluation and management service by telephone and the availability of in person appointments. The patient expressed understanding and agreed to proceed.  Interactive audio and video telecommunications were attempted between this nurse and patient, however failed, due to patient having technical difficulties OR patient did not have access to video capability.  We continued and completed visit with audio only.  Some vital signs may be absent or patient reported.   Kalasia Crafton E Jensyn Shave, LPN   Review of Systems     Cardiac Risk Factors include: advanced age (>51men, >14 women);dyslipidemia;hypertension     Objective:    Today's Vitals   02/14/22 1034  Weight: 155 lb (70.3 kg)  PainSc: 4    Body mass index is 26.61 kg/m.     02/14/2022   10:58 AM 01/03/2022   10:20 AM 02/11/2021   10:46 AM 02/11/2020   10:23 AM 01/20/2019    8:38 AM  Advanced Directives  Does Patient Have a Medical Advance Directive? Yes Yes Yes Yes Yes  Type of Estate agent of [place];Living will Healthcare Power of [place];Living will Healthcare Power of [place];Living will Healthcare Power of [place];Living will Healthcare Power of [place];Living will  Does patient want to make changes to medical advance directive?    No - Patient declined No - Patient declined  Copy of Healthcare Power of Attorney in Chart? No - copy requested  No - copy requested  No - copy requested    Current  Medications (verified) Outpatient Encounter Medications as of 02/14/2022  Medication Sig   acetaminophen (TYLENOL) 500 MG tablet Take 2 tablets (1,000 mg total) by mouth every 8 (eight) hours as needed.   ALPRAZolam (XANAX) 0.25 MG tablet Take 1 tablet (0.25 mg total) by mouth at bedtime as needed for anxiety.   aspirin EC 81 MG tablet Take 1 tablet (81 mg total) by mouth 2 (two) times daily for 28 days. Swallow whole.   cholecalciferol (VITAMIN D3) 25 MCG (1000 UNIT) tablet Take 2,000 Units by mouth daily.   escitalopram (LEXAPRO) 20 MG tablet Take 1 tablet (20 mg total) by mouth daily.   fluticasone (FLONASE) 50 MCG/ACT nasal spray Place 2 sprays into both nostrils daily.   hydrochlorothiazide (MICROZIDE) 12.5 MG capsule Take 1 capsule (12.5 mg total) by mouth daily.   levothyroxine (SYNTHROID) 75 MCG tablet Take 1 tablet (75 mcg total) by mouth daily.   losartan (COZAAR) 25 MG tablet Take 0.5 tablets (12.5 mg total) by mouth daily.   pantoprazole (PROTONIX) 40 MG tablet Take 40 mg by mouth daily.   rosuvastatin (CRESTOR) 10 MG tablet Take 1 tablet (10 mg total) by mouth daily.   No facility-administered encounter medications on file as of 02/14/2022.    Allergies (verified) Effexor [venlafaxine], Penicillins, and Erythromycin   History: Past Medical History:  Diagnosis Date   Allergic rhinitis    Depression    DJD (degenerative joint disease)    cervical and Rt. knee, Rt. 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   History of kidney stones    Hypertension    Hypothyroidism    Insomnia    Occassional   Migraine    Seborrheic keratoses    Scattered seborrheic keratoses   Past Surgical History:  Procedure Laterality Date   ABDOMINAL HYSTERECTOMY  1986    bilateral cataract surgery      CESAREAN SECTION     right knee replacement      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   Procedure: TOTAL KNEE ARTHROPLASTY;  Surgeon: Joen LauraMarchwiany, Daniel A, MD;  Location: [facility];  Service: Orthopedics;  Laterality: Left;   Family History  Problem Relation Age of Onset   Cancer Father        lung   Arthritis Sister    Breast cancer Neg Hx    Social History   Socioeconomic History   Marital status: Married    Spouse name: Lollie SailsHarry   Number of children: 3   Years of education: Not on file   Highest education level: 12th grade  Occupational History   Occupation: Retired  Tobacco Use   Smoking status: Never   Smokeless tobacco: Never  Vaping Use   Vaping Use: Never used  Substance and Sexual Activity   Alcohol use: Never   Drug use: Never   Sexual activity: Not Currently  Other Topics Concern   Not on file  Social History Narrative   Lives at home with husband. One son lives in [place].   Social Determinants of Health   Financial Resource Strain: Low Risk  (02/14/2022)   Overall Financial Resource Strain (CARDIA)    Difficulty of Paying Living Expenses: Not hard at all  Food Insecurity: No Food Insecurity (02/14/2022)   Hunger Vital Sign    Worried About Running Out of Food in the Last Year: Never true    Ran Out of Food in the Last Year: Never true  Transportation Needs: No Transportation Needs (02/14/2022)   PRAPARE - Administrator, Civil ServiceTransportation    Lack of Transportation (Medical): No    Lack of Transportation (Non-Medical): No  Physical Activity: Sufficiently Active (02/14/2022)   Exercise Vital Sign    Days of Exercise per Week: 3 days    Minutes of Exercise per Session: 60 min  Stress: No Stress Concern Present (02/14/2022)   [employer] - Occupational Stress Questionnaire    Feeling of Stress : Only a little  Social Connections: Socially Integrated (02/14/2022)   Social Connection and Isolation Panel [NHANES]     Frequency of Communication with Friends and Family: More than three times a week    Frequency of Social Gatherings with Friends and Family: More than three times a week    Attends Religious Services: More than 4 times per year    Active Member of [employer] or Organizations: Yes    Attends Engineer, structuralClub or Organization Meetings: More than 4 times per year    Marital Status: Married    Tobacco Counseling Counseling given: Not Answered   Clinical Intake:  Pre-visit preparation completed: Yes  Pain : 0-10 Pain Score: 4  Pain Type: Chronic pain Pain Location: Knee Pain Orientation: Left Pain Descriptors / Indicators: Aching, Sharp, Sore Pain Onset: 1 to 4 weeks ago Pain Frequency: Intermittent     BMI - recorded: 26.61  Nutritional Status: BMI 25 -29 Overweight Nutritional Risks: None Diabetes: No  How often do you need to have someone help you when you read instructions, pamphlets, or other written materials from your doctor or pharmacy?: 1 - Never  Diabetic? no  Interpreter Needed?: No  Information entered by :: Kyira Volkert, LPN   Activities of Daily Living    02/14/2022   10:58 AM 01/03/2022   10:22 AM  In your present state of health, do you have any difficulty performing the following activities:  Hearing? 0   Vision? 0   Difficulty concentrating or making decisions? 0   Walking or climbing stairs? 1   Comment recent knee surgery   Dressing or bathing? 0   Doing errands, shopping? 0 0  Preparing Food and eating ? N   Using the Toilet? N   In the past six months, have you accidently leaked urine? N   Do you have problems with loss of bowel control? N   Managing your Medications? N   Managing your Finances? N   Housekeeping or managing your Housekeeping? N     Patient Care Team: Dettinger, Elige Radon, MD as PCP - General (Family Medicine) Danella Maiers, [facility] (Pharmacist) Sallyanne Havers Roseanne Reno, PA-C (Gastroenterology) Michaelle Copas, MD as Referring Physician  (Optometry)  Indicate any recent Medical Services you may have received from other than [facility] providers in the past year (date may be approximate).     Assessment:   This is a routine wellness examination for Daryana.  Hearing/Vision screen Hearing Screening - Comments:: Denies hearing difficulties   Vision Screening - Comments:: Wears rx glasses - up to date with routine eye exams with [facility] in [place]  Dietary issues and exercise activities discussed: Current Exercise Habits: Home exercise routine;Structured exercise class (PT 2x per week, plus daily exercise at home), Type of exercise: strength training/weights;stretching;walking, Time (Minutes): 60, Frequency (Times/Week): 3, Weekly Exercise (Minutes/Week): 180, Intensity: Moderate, Exercise limited by: orthopedic condition(s)   Goals Addressed             This Visit's Progress    Exercise 3x per week (30 min per time)   On track    Prevent falls   On track      Depression Screen    02/14/2022   10:37 AM 12/21/2021   11:30 AM 10/20/2021    2:50 PM 09/06/2021    9:41 AM 04/19/2021   10:57 AM 02/17/2021   11:04 AM 02/11/2021   10:33 AM  PHQ 2/9 Scores  PHQ - 2 Score 2 0 PHQ- 9 Score Fall Risk    02/14/2022   10:36 AM 10/20/2021    2:50 PM 09/06/2021    9:41 AM 04/19/2021   10:57 AM 02/17/2021   11:04 AM  Fall Risk   Falls in the past year? 0 0 0 0 0  Number falls in past yr: 0 0     Injury with Fall? 0 0     Risk for fall due to : Orthopedic patient      Follow up Falls prevention discussed;Education provided        FALL RISK PREVENTION PERTAINING TO THE HOME:  Any stairs in or around the home? Yes  If so, are there any without handrails? No  Home free of loose throw rugs in walkways, pet beds, electrical cords, etc? Yes  Adequate lighting in  your home to reduce risk of falls? Yes   ASSISTIVE DEVICES UTILIZED TO PREVENT FALLS:  Life alert? No  Use of a cane, walker or w/c? No   Grab bars in the bathroom? Yes  Shower chair or bench in shower? Yes  Elevated toilet seat or a handicapped toilet? Yes   TIMED UP AND GO:  Was the test performed? No . Telephonic visit  Cognitive Function:        02/14/2022   10:39 AM 02/11/2020   10:26 AM 01/20/2019    8:39 AM  6CIT Screen  What Year? 0 points 0 points 0 points  What month? 0 points 0 points 0 points  What time? 0 points 0 points 0 points  Count back from 20 0 points 0 points 0 points  Months in reverse 0 points 0 points 0 points  Repeat phrase 0 points 0 points 0 points  Total Score 0 points 0 points 0 points    Immunizations Immunization History  Administered Date(s) Administered   Fluad Quad(high Dose 65+) 06/03/2019, 06/18/2020, 05/19/2021   Influenza Split 07/27/2010, 07/20/2011, 09/02/2012, 06/18/2013, 06/23/2015, 05/24/2016   Influenza, High Dose Seasonal PF 06/13/2017, 06/26/2018   Moderna Sars-Covid-2 Vaccination 10/18/2019, 11/15/2019, 07/07/2020   Pneumococcal Conjugate-13 08/24/2014   Pneumococcal Polysaccharide-23 08/10/2011, 09/19/2019   Tdap 09/17/2006, 02/17/2021   Zoster Recombinat (Shingrix) 11/25/2021    TDAP status: Up to date  Flu Vaccine status: Up to date  Pneumococcal vaccine status: Up to date  Covid-19 vaccine status: Completed vaccines  Qualifies for Shingles Vaccine? Yes   Zostavax completed No   Shingrix Completed?: No.    Education has been provided regarding the importance of this vaccine. Patient has been advised to call insurance company to determine out of pocket expense if they have not yet received this vaccine. Advised may also receive vaccine at local pharmacy or Health Dept. Verbalized acceptance and understanding.  Screening Tests Health Maintenance  Topic Date Due   Zoster Vaccines- Shingrix (2 of 2) 01/20/2022   COVID-19 Vaccine (4 - Booster for Moderna series) 02/17/2022 (Originally 09/01/2020)   INFLUENZA VACCINE  04/04/2022   MAMMOGRAM  06/29/2022    Fecal DNA (Cologuard)  06/17/2024   TETANUS/TDAP  02/18/2031   Pneumonia Vaccine 61+ Years old  Completed   DEXA SCAN  Completed   Hepatitis C Screening  Completed   HPV VACCINES  Aged Out    Health Maintenance  Health Maintenance Due  Topic Date Due   Zoster Vaccines- Shingrix (2 of 2) 01/20/2022    Colorectal cancer screening: Type of screening: Cologuard. Completed 06/17/2021. Repeat every 3 years  Mammogram status: Completed 06/29/2021. Repeat every year  Bone Density status: Completed 02/18/2021. Results reflect: Bone density results: NORMAL. Repeat every 2-5 years.  Lung Cancer Screening: (Low Dose CT Chest recommended if Age 101-80 years, 30 pack-year currently smoking OR have quit w/in 15years.) does not qualify.   Additional Screening:  Hepatitis C Screening: does qualify; Completed 05/27/2018  Vision Screening: Recommended annual ophthalmology exams for early detection of glaucoma and other disorders of the eye. Is the patient up to date with their annual eye exam?  Yes  Who is the provider or what is the name of the office in which the patient attends annual eye exams? [facility] [place] If pt is not established with a provider, would they like to be referred to a provider to establish care? No .   Dental Screening: Recommended annual dental exams for proper oral hygiene  Community Resource Referral / Chronic Care Management: CRR required this visit?  No   CCM required this visit?  No      Plan:     I have personally reviewed and noted the following in the patient's chart:   Medical and social history Use of alcohol, tobacco or illicit drugs  Current medications and supplements including opioid prescriptions.  Functional ability and status Nutritional status Physical activity Advanced directives List of other physicians Hospitalizations, surgeries, and ER visits in previous 12 months Vitals Screenings to include cognitive, depression, and  falls Referrals and appointments  In addition, I have reviewed and discussed with patient certain preventive protocols, quality metrics, and best practice recommendations. A written personalized care plan for preventive services as well as general preventive health recommendations were provided to patient.     [place] Constable, LPN   2/50/5397   Nurse Notes: None

## 2022-02-15 DIAGNOSIS — Z471 Aftercare following joint replacement surgery: Secondary | ICD-10-CM | POA: Diagnosis not present

## 2022-02-15 DIAGNOSIS — M6281 Muscle weakness (generalized): Secondary | ICD-10-CM | POA: Diagnosis not present

## 2022-02-15 DIAGNOSIS — M25562 Pain in left knee: Secondary | ICD-10-CM | POA: Diagnosis not present

## 2022-02-22 DIAGNOSIS — M25562 Pain in left knee: Secondary | ICD-10-CM | POA: Diagnosis not present

## 2022-02-22 DIAGNOSIS — Z471 Aftercare following joint replacement surgery: Secondary | ICD-10-CM | POA: Diagnosis not present

## 2022-02-22 DIAGNOSIS — M6281 Muscle weakness (generalized): Secondary | ICD-10-CM | POA: Diagnosis not present

## 2022-02-24 DIAGNOSIS — M6281 Muscle weakness (generalized): Secondary | ICD-10-CM | POA: Diagnosis not present

## 2022-02-24 DIAGNOSIS — M25562 Pain in left knee: Secondary | ICD-10-CM | POA: Diagnosis not present

## 2022-02-24 DIAGNOSIS — Z471 Aftercare following joint replacement surgery: Secondary | ICD-10-CM | POA: Diagnosis not present

## 2022-03-06 DIAGNOSIS — M1712 Unilateral primary osteoarthritis, left knee: Secondary | ICD-10-CM | POA: Diagnosis not present

## 2022-04-18 DIAGNOSIS — M170 Bilateral primary osteoarthritis of knee: Secondary | ICD-10-CM | POA: Diagnosis not present

## 2022-04-19 ENCOUNTER — Ambulatory Visit (INDEPENDENT_AMBULATORY_CARE_PROVIDER_SITE_OTHER): Payer: Medicare Other | Admitting: Family Medicine

## 2022-04-19 ENCOUNTER — Encounter: Payer: Self-pay | Admitting: Family Medicine

## 2022-04-19 VITALS — BP 126/66 | HR 72 | Temp 98.0°F | Ht 64.0 in | Wt 167.0 lb

## 2022-04-19 DIAGNOSIS — I10 Essential (primary) hypertension: Secondary | ICD-10-CM | POA: Diagnosis not present

## 2022-04-19 DIAGNOSIS — Z23 Encounter for immunization: Secondary | ICD-10-CM | POA: Diagnosis not present

## 2022-04-19 DIAGNOSIS — F411 Generalized anxiety disorder: Secondary | ICD-10-CM | POA: Diagnosis not present

## 2022-04-19 DIAGNOSIS — E039 Hypothyroidism, unspecified: Secondary | ICD-10-CM

## 2022-04-19 DIAGNOSIS — E782 Mixed hyperlipidemia: Secondary | ICD-10-CM

## 2022-04-19 MED ORDER — BUPROPION HCL ER (XL) 150 MG PO TB24: 150.0000 mg | ORAL_TABLET | Freq: Every day | ORAL | 2 refills | Status: DC

## 2022-04-19 MED ORDER — ALPRAZOLAM 0.25 MG PO TABS: 0.2500 mg | ORAL_TABLET | Freq: Every evening | ORAL | 5 refills | Status: DC | PRN

## 2022-04-19 NOTE — Progress Notes (Signed)
BP 126/66   Pulse 72   Temp 98 F (36.7 C)   Ht 5' 4"  (1.626 m)   Wt 167 lb (75.8 kg)   SpO2 100%   BMI 28.67 kg/m    Subjective:   Patient ID: Erven Colla, female    DOB: Dec 21, 1945, 76 y.o.   MRN: [id number]  HPI: DHYANA BASTONE is a 76 y.o. female presenting on 04/19/2022 for Medical Management of Chronic Issues, Anxiety, and Hypothyroidism   HPI Hypertension Patient is currently on losartan and hydrochlorothiazide, and their blood pressure today is 126/66. Patient denies any lightheadedness or dizziness. Patient denies headaches, blurred vision, chest pains, shortness of breath, or weakness. Denies any side effects from medication and is content with current medication.   Hypothyroidism recheck Patient is coming in for thyroid recheck today as well. They deny any issues with hair changes or heat or cold problems or diarrhea or constipation. They deny any chest pain or palpitations. They are currently on levothyroxine 75 micrograms   Hyperlipidemia Patient is coming in for recheck of his hyperlipidemia. The patient is currently taking Crestor. They deny any issues with myalgias or history of liver damage from it. They deny any focal numbness or weakness or chest pain.   Anxiety Patient has been having more anxiety recently and she feels like it is affecting her ability to focus and concentrate and she feels like her stress has been higher and she would like to see if she can have something help with that but preferably something that does not cause weight gain. Current rx-Xanax 0.25 mg nightly as needed # meds rx-30 Effectiveness of current meds-works well Adverse reactions form meds-none  Pill count performed-No Last drug screen -11/04/2021 ( high risk q71m moderate risk q668mlow risk yearly ) Urine drug screen today- No [place]  If yes were their any concerning findings? -None  No flowsheet data found.   Controlled substance contract signed  on: Today  Relevant past medical, surgical, family and social history reviewed and updated as indicated. Interim medical history since our last visit reviewed. Allergies and medications reviewed and updated.  Review of Systems  Constitutional:  Negative for chills and fever.  Eyes:  Negative for visual disturbance.  Respiratory:  Negative for chest tightness and shortness of breath.   Cardiovascular:  Negative for chest pain and leg swelling.  Genitourinary:  Negative for difficulty urinating and dysuria.  Musculoskeletal:  Negative for gait problem.  Skin:  Negative for rash.  Neurological:  Negative for light-headedness and headaches.  Psychiatric/Behavioral:  Positive for decreased concentration and dysphoric mood. Negative for agitation, behavioral problems, self-injury, sleep disturbance and suicidal ideas. The patient is nervous/anxious.   All other systems reviewed and are negative.   Per HPI unless specifically indicated above   Allergies as of 04/19/2022       Reactions   Effexor [venlafaxine]    Dizziness   Penicillins Itching, Swelling   Erythromycin Rash        Medication List        Accurate as of April 19, 2022 12:23 PM. If you have any questions, ask your nurse or doctor.          ALPRAZolam 0.25 MG tablet Commonly known as: XANAX Take 1 tablet (0.25 mg total) by mouth at bedtime as needed for anxiety.   buPROPion 150 MG 24 hr tablet Commonly known as: Wellbutrin XL Take 1 tablet (150 mg total) by mouth daily. Started by:  Fransisca Kaufmann Yosiel Thieme, MD   cholecalciferol 25 MCG (1000 UNIT) tablet Commonly known as: VITAMIN D3 Take 2,000 Units by mouth daily.   escitalopram 20 MG tablet Commonly known as: LEXAPRO Take 1 tablet (20 mg total) by mouth daily.   fluticasone 50 MCG/ACT nasal spray Commonly known as: FLONASE Place 2 sprays into both nostrils daily.   hydrochlorothiazide 12.5 MG capsule Commonly known as: MICROZIDE Take 1 capsule (12.5  mg total) by mouth daily.   levothyroxine 75 MCG tablet Commonly known as: SYNTHROID Take 1 tablet (75 mcg total) by mouth daily.   losartan 25 MG tablet Commonly known as: COZAAR Take 0.5 tablets (12.5 mg total) by mouth daily.   pantoprazole 40 MG tablet Commonly known as: PROTONIX Take 40 mg by mouth daily.   rosuvastatin 10 MG tablet Commonly known as: CRESTOR Take 1 tablet (10 mg total) by mouth daily.         Objective:   BP 126/66   Pulse 72   Temp 98 F (36.7 C)   Ht 5' 4"  (1.626 m)   Wt 167 lb (75.8 kg)   SpO2 100%   BMI 28.67 kg/m   Wt Readings from Last 3 Encounters:  04/19/22 167 lb (75.8 kg)  02/14/22 155 lb (70.3 kg)  01/16/22 156 lb 15.8 oz (71.2 kg)    Physical Exam Vitals and nursing note reviewed.  Constitutional:      General: She is not in acute distress.    Appearance: She is well-developed. She is not diaphoretic.  Eyes:     Conjunctiva/sclera: Conjunctivae normal.  Cardiovascular:     Rate and Rhythm: Normal rate and regular rhythm.     Heart sounds: Normal heart sounds. No murmur heard. Pulmonary:     Effort: Pulmonary effort is normal. No respiratory distress.     Breath sounds: Normal breath sounds. No wheezing.  Musculoskeletal:        General: No tenderness. Normal range of motion.  Skin:    General: Skin is warm and dry.     Findings: No rash.  Neurological:     Mental Status: She is alert and oriented to person, place, and time.     Coordination: Coordination normal.  Psychiatric:        Behavior: Behavior normal.       Assessment & Plan:   Problem List Items Addressed This Visit       Cardiovascular and Mediastinum   Hypertension - Primary   Relevant Orders   CBC with Differential/Platelet   CMP14+EGFR   Lipid panel     Endocrine   Hypothyroidism   Relevant Orders   CBC with Differential/Platelet   CMP14+EGFR   Lipid panel   TSH     Other   Hyperlipidemia   Relevant Orders   CBC with  Differential/Platelet   CMP14+EGFR   Lipid panel   GAD (generalized anxiety disorder)   Relevant Medications   ALPRAZolam (XANAX) 0.25 MG tablet   buPROPion (WELLBUTRIN XL) 150 MG 24 hr tablet   Other Visit Diagnoses     Need for shingles vaccine       Relevant Orders   Zoster Recombinant (Shingrix ) (Completed)       Start Wellbutrin, We will see if it helps more with her anxiety Continue other medicine will do refills.  We will do blood work as well. Follow up plan: Return in about 2 months (around 06/19/2022), or if symptoms worsen or fail to improve, for Anxiety and  memory recheck.  Counseling provided for all of the vaccine components Orders Placed This Encounter  Procedures   Zoster Recombinant (Shingrix )   CBC with Differential/Platelet   CMP14+EGFR   Lipid panel   TSH    Caryl Pina, MD [facility] 04/19/2022, 12:23 PM

## 2022-04-20 LAB — CMP14+EGFR
ALT: 11 IU/L (ref 0–32)
AST: 16 IU/L (ref 0–40)
Albumin/Globulin Ratio: 1.7 (ref 1.2–2.2)
Albumin: 4.3 g/dL (ref 3.8–4.8)
Alkaline Phosphatase: 66 IU/L (ref 44–121)
BUN/Creatinine Ratio: 22 (ref 12–28)
BUN: 27 mg/dL (ref 8–27)
Bilirubin Total: 0.3 mg/dL (ref 0.0–1.2)
CO2: 23 mmol/L (ref 20–29)
Calcium: 9.9 mg/dL (ref 8.7–10.3)
Chloride: 101 mmol/L (ref 96–106)
Creatinine, Ser: 1.21 mg/dL — ABNORMAL HIGH (ref 0.57–1.00)
Globulin, Total: 2.6 g/dL (ref 1.5–4.5)
Glucose: 102 mg/dL — ABNORMAL HIGH (ref 70–99)
Potassium: 4.3 mmol/L (ref 3.5–5.2)
Sodium: 140 mmol/L (ref 134–144)
Total Protein: 6.9 g/dL (ref 6.0–8.5)
eGFR: 47 mL/min/{1.73_m2} — ABNORMAL LOW (ref >59)

## 2022-04-20 LAB — LIPID PANEL
Chol/HDL Ratio: 2.8 ratio (ref 0.0–4.4)
Cholesterol, Total: 168 mg/dL (ref 100–199)
HDL: 61 mg/dL (ref >39)
LDL Chol Calc (NIH): 77 mg/dL (ref 0–99)
Triglycerides: 183 mg/dL — ABNORMAL HIGH (ref 0–149)
VLDL Cholesterol Cal: 30 mg/dL (ref 5–40)

## 2022-04-20 LAB — CBC WITH DIFFERENTIAL/PLATELET
Basophils Absolute: 0.1 10*3/uL (ref 0.0–0.2)
Basos: 1 %
EOS (ABSOLUTE): 0.3 10*3/uL (ref 0.0–0.4)
Eos: 5 %
Hematocrit: 34.4 % (ref 34.0–46.6)
Hemoglobin: 10.5 g/dL — ABNORMAL LOW (ref 11.1–15.9)
Immature Grans (Abs): 0 10*3/uL (ref 0.0–0.1)
Immature Granulocytes: 0 %
Lymphocytes Absolute: 1.3 10*3/uL (ref 0.7–3.1)
Lymphs: 18 %
MCH: 24.6 pg — ABNORMAL LOW (ref 26.6–33.0)
MCHC: 30.5 g/dL — ABNORMAL LOW (ref 31.5–35.7)
MCV: 81 fL (ref 79–97)
Monocytes Absolute: 0.5 10*3/uL (ref 0.1–0.9)
Monocytes: 8 %
Neutrophils Absolute: 4.9 10*3/uL (ref 1.4–7.0)
Neutrophils: 68 %
Platelets: 320 10*3/uL (ref 150–450)
RBC: 4.27 x10E6/uL (ref 3.77–5.28)
RDW: 13.6 % (ref 11.7–15.4)
WBC: 7.1 10*3/uL (ref 3.4–10.8)

## 2022-04-20 LAB — TSH: TSH: 3.83 u[IU]/mL (ref 0.450–4.500)

## 2022-05-26 ENCOUNTER — Other Ambulatory Visit: Payer: Self-pay | Admitting: Family Medicine

## 2022-05-26 DIAGNOSIS — Z1231 Encounter for screening mammogram for malignant neoplasm of breast: Secondary | ICD-10-CM

## 2022-06-01 ENCOUNTER — Ambulatory Visit (INDEPENDENT_AMBULATORY_CARE_PROVIDER_SITE_OTHER): Payer: Medicare Other | Admitting: Family Medicine

## 2022-06-01 ENCOUNTER — Encounter: Payer: Self-pay | Admitting: Family Medicine

## 2022-06-01 VITALS — BP 138/82 | HR 84 | Temp 98.3°F | Ht 64.0 in | Wt 156.0 lb

## 2022-06-01 DIAGNOSIS — R519 Headache, unspecified: Secondary | ICD-10-CM | POA: Diagnosis not present

## 2022-06-01 DIAGNOSIS — D649 Anemia, unspecified: Secondary | ICD-10-CM

## 2022-06-01 NOTE — Progress Notes (Signed)
BP 138/82   Pulse 84   Temp 98.3 F (36.8 C)   Ht 5\' 4"  (1.626 m)   Wt 156 lb (70.8 kg)   SpO2 100%   BMI 26.78 kg/m    Subjective:   Patient ID: Isabella Lindsey, female    DOB: 09-15-1945, 76 y.o.   MRN: [id number]  HPI: Isabella Lindsey is a 76 y.o. female presenting on 06/01/2022 for Dizziness, Headache, and Nausea   HPI Headaches dizziness nausea Patient is coming in today with complaint of headache dizziness nausea have all been increasing over the past 2 to 3 weeks.  For example she had an episode yesterday where she got up and then she felt off balance and lightheaded nauseous and dizzy and then she laid back down and had a headache to come with it.  She says she had to rest to get it to go away.  She does admit to light sensitivity along with these episodes.  She has had these episodes frequently, at least a few times a week.  She did have some sinus issues before and uses Flonase but denies any nasal drainage or cough or congestion currently.  The pain when she does get it is 5 out of 10 and in the bandlike area across the front of her head but sometimes over the left eye and going to the left temple as well.  Relevant past medical, surgical, family and social history reviewed and updated as indicated. Interim medical history since our last visit reviewed. Allergies and medications reviewed and updated.  Review of Systems  Constitutional:  Negative for chills and fever.  HENT:  Negative for congestion, ear discharge, ear pain, sinus pressure, sinus pain and sore throat.   Eyes:  Negative for redness and visual disturbance.  Respiratory:  Negative for chest tightness and shortness of breath.   Cardiovascular:  Negative for chest pain and leg swelling.  Genitourinary:  Negative for difficulty urinating and dysuria.  Musculoskeletal:  Negative for back pain and gait problem.  Skin:  Negative for rash.  Neurological:  Positive for dizziness, light-headedness and headaches.  Negative for tremors, seizures, syncope, speech difficulty, weakness and numbness.  Psychiatric/Behavioral:  Negative for agitation and behavioral problems.   All other systems reviewed and are negative.   Per HPI unless specifically indicated above   Allergies as of 06/01/2022       Reactions   Effexor [venlafaxine]    Dizziness   Penicillins Itching, Swelling   Erythromycin Rash        Medication List        Accurate as of June 01, 2022  9:06 AM. If you have any questions, ask your nurse or doctor.          ALPRAZolam 0.25 MG tablet Commonly known as: XANAX Take 1 tablet (0.25 mg total) by mouth at bedtime as needed for anxiety.   buPROPion 150 MG 24 hr tablet Commonly known as: Wellbutrin XL Take 1 tablet (150 mg total) by mouth daily.   cholecalciferol 25 MCG (1000 UNIT) tablet Commonly known as: VITAMIN D3 Take 2,000 Units by mouth daily.   escitalopram 20 MG tablet Commonly known as: LEXAPRO Take 1 tablet (20 mg total) by mouth daily.   fluticasone 50 MCG/ACT nasal spray Commonly known as: FLONASE Place 2 sprays into both nostrils daily.   hydrochlorothiazide 12.5 MG capsule Commonly known as: MICROZIDE Take 1 capsule (12.5 mg total) by mouth daily.   levothyroxine 75 MCG tablet Commonly  known as: SYNTHROID Take 1 tablet (75 mcg total) by mouth daily.   losartan 25 MG tablet Commonly known as: COZAAR Take 0.5 tablets (12.5 mg total) by mouth daily.   pantoprazole 40 MG tablet Commonly known as: PROTONIX Take 40 mg by mouth daily.   rosuvastatin 10 MG tablet Commonly known as: CRESTOR Take 1 tablet (10 mg total) by mouth daily.         Objective:   BP 138/82   Pulse 84   Temp 98.3 F (36.8 C)   Ht 5\' 4"  (1.626 m)   Wt 156 lb (70.8 kg)   SpO2 100%   BMI 26.78 kg/m   Wt Readings from Last 3 Encounters:  06/01/22 156 lb (70.8 kg)  04/19/22 167 lb (75.8 kg)  02/14/22 155 lb (70.3 kg)    Physical Exam Vitals and nursing  note reviewed.  Constitutional:      General: She is not in acute distress.    Appearance: She is well-developed. She is not diaphoretic.  Eyes:     Conjunctiva/sclera: Conjunctivae normal.     Pupils: Pupils are equal, round, and reactive to light.  Cardiovascular:     Rate and Rhythm: Normal rate and regular rhythm.     Heart sounds: Normal heart sounds. No murmur heard. Pulmonary:     Effort: Pulmonary effort is normal. No respiratory distress.     Breath sounds: Normal breath sounds. No wheezing.  Musculoskeletal:        General: No tenderness. Normal range of motion.  Skin:    General: Skin is warm and dry.     Findings: No rash.  Neurological:     Mental Status: She is alert and oriented to person, place, and time. Mental status is at baseline.     Cranial Nerves: No cranial nerve deficit.     Sensory: Sensation is intact.     Motor: Motor function is intact. No weakness.     Coordination: Coordination is intact. Coordination normal.     Gait: Gait is intact.  Psychiatric:        Mood and Affect: Mood normal.        Speech: Speech normal.        Behavior: Behavior normal.       Assessment & Plan:   Problem List Items Addressed This Visit   None Visit Diagnoses     New onset of headaches    -  Primary   Relevant Orders   Ambulatory referral to Neurology   Anemia, unspecified type       Relevant Orders   Anemia Profile B       We will recheck for anemia to make sure that is not the reason behind her lightheadedness and dizziness, does not seem like it is a sinus issue based on the way symptoms are presenting, concern for new onset migraines, will refer to neurology Follow up plan: Return if symptoms worsen or fail to improve.  Counseling provided for all of the vaccine components Orders Placed This Encounter  Procedures   Anemia Profile B   Ambulatory referral to Neurology    02/16/22, MD [facility] 06/01/2022, 9:06  AM

## 2022-06-02 ENCOUNTER — Telehealth: Payer: Self-pay | Admitting: Family Medicine

## 2022-06-02 ENCOUNTER — Other Ambulatory Visit: Payer: Self-pay

## 2022-06-02 LAB — ANEMIA PROFILE B
Basophils Absolute: 0.1 10*3/uL (ref 0.0–0.2)
Basos: 1 %
EOS (ABSOLUTE): 0.2 10*3/uL (ref 0.0–0.4)
Eos: 2 %
Ferritin: 11 ng/mL — ABNORMAL LOW (ref 15–150)
Folate: 12.1 ng/mL (ref >3.0)
Hematocrit: 36.5 % (ref 34.0–46.6)
Hemoglobin: 11.6 g/dL (ref 11.1–15.9)
Immature Grans (Abs): 0 10*3/uL (ref 0.0–0.1)
Immature Granulocytes: 0 %
Iron Saturation: 6 % — CL (ref 15–55)
Iron: 28 ug/dL (ref 27–139)
Lymphocytes Absolute: 1.2 10*3/uL (ref 0.7–3.1)
Lymphs: 17 %
MCH: 25.3 pg — ABNORMAL LOW (ref 26.6–33.0)
MCHC: 31.8 g/dL (ref 31.5–35.7)
MCV: 80 fL (ref 79–97)
Monocytes Absolute: 0.5 10*3/uL (ref 0.1–0.9)
Monocytes: 8 %
Neutrophils Absolute: 5 10*3/uL (ref 1.4–7.0)
Neutrophils: 72 %
Platelets: 328 10*3/uL (ref 150–450)
RBC: 4.59 x10E6/uL (ref 3.77–5.28)
RDW: 16.6 % — ABNORMAL HIGH (ref 11.7–15.4)
Retic Ct Pct: 1 % (ref 0.6–2.6)
Total Iron Binding Capacity: 452 ug/dL — ABNORMAL HIGH (ref 250–450)
UIBC: 424 ug/dL — ABNORMAL HIGH (ref 118–369)
Vitamin B-12: 571 pg/mL (ref 232–1245)
WBC: 6.9 10*3/uL (ref 3.4–10.8)

## 2022-06-02 MED ORDER — IRON (FERROUS SULFATE) 325 (65 FE) MG PO TABS: 325.0000 mg | ORAL_TABLET | Freq: Every day | ORAL | 1 refills | Status: DC

## 2022-06-02 NOTE — Telephone Encounter (Signed)
Patient would like to talk to nurse about lab work. Please call back and advise

## 2022-06-02 NOTE — Telephone Encounter (Signed)
Pt informed of results and aware that iron supplement has been sent.

## 2022-06-19 ENCOUNTER — Ambulatory Visit (INDEPENDENT_AMBULATORY_CARE_PROVIDER_SITE_OTHER): Payer: Medicare Other | Admitting: Family Medicine

## 2022-06-19 ENCOUNTER — Encounter: Payer: Self-pay | Admitting: Family Medicine

## 2022-06-19 VITALS — BP 145/73 | HR 69 | Temp 97.4°F | Ht 64.0 in | Wt 157.0 lb

## 2022-06-19 DIAGNOSIS — F411 Generalized anxiety disorder: Secondary | ICD-10-CM

## 2022-06-19 DIAGNOSIS — D649 Anemia, unspecified: Secondary | ICD-10-CM

## 2022-06-19 MED ORDER — BUPROPION HCL ER (XL) 150 MG PO TB24: 150.0000 mg | ORAL_TABLET | Freq: Every day | ORAL | 1 refills | Status: DC

## 2022-06-19 NOTE — Progress Notes (Signed)
BP (!) 145/73   Pulse 69   Temp (!) 97.4 F (36.3 C)   Ht 5\' 4"  (1.626 m)   Wt 157 lb (71.2 kg)   SpO2 98%   BMI 26.95 kg/m    Subjective:   Patient ID: , female    DOB: Oct 07, 1945, 76 y.o.   MRN: [id number]  HPI: Isabella Lindsey is a 76 y.o. female presenting on 06/19/2022 for Medical Management of Chronic Issues, Anemia, and Headache   HPI Iron-deficiency recheck Patient is coming in for iron deficiency recheck.  She is starting iron supplementation she feels like her energy is up.  Anxiety depression and memory recheck Patient is coming in for anxiety depression and memory recheck.  She is currently taking Lexapro and Wellbutrin and alprazolam as needed.   The Wellbutrin was new and she feels like it is working well for her.    06/19/2022   11:10 AM 06/01/2022    8:50 AM 04/19/2022   12:02 PM 02/14/2022   10:37 AM 12/21/2021   11:30 AM  Depression screen PHQ 2/9  Decreased Interest 1 2 1 1  0  Down, Depressed, Hopeless 1 1 1 1  0  PHQ - 2 Score 2 3 2 2  0  Altered sleeping 1 2 2 1    Tired, decreased energy 1 2 2 1    Change in appetite 1 1 1  0   Feeling bad or failure about yourself  0 1 0 0   Trouble concentrating 0 0 0 0   Moving slowly or fidgety/restless 0 0 0 0   Suicidal thoughts 0 0 0 0   PHQ-9 Score 5 9 7 4    Difficult doing work/chores Somewhat difficult  Somewhat difficult Somewhat difficult      Relevant past medical, surgical, family and social history reviewed and updated as indicated. Interim medical history since our last visit reviewed. Allergies and medications reviewed and updated.  Review of Systems  Constitutional:  Positive for fatigue. Negative for chills and fever.  Eyes:  Negative for visual disturbance.  Respiratory:  Negative for chest tightness and shortness of breath.   Cardiovascular:  Negative for chest pain and leg swelling.  Genitourinary:  Negative for difficulty urinating and dysuria.  Musculoskeletal:  Negative  for back pain and gait problem.  Skin:  Negative for rash.  Neurological:  Negative for weakness, light-headedness and headaches.  Psychiatric/Behavioral:  Negative for agitation and behavioral problems.   All other systems reviewed and are negative.   Per HPI unless specifically indicated above   Allergies as of 06/19/2022       Reactions   Effexor [venlafaxine]    Dizziness   Penicillins Itching, Swelling   Erythromycin Rash        Medication List        Accurate as of June 19, 2022 11:25 AM. If you have any questions, ask your nurse or doctor.          ALPRAZolam 0.25 MG tablet Commonly known as: XANAX Take 1 tablet (0.25 mg total) by mouth at bedtime as needed for anxiety.   buPROPion 150 MG 24 hr tablet Commonly known as: Wellbutrin XL Take 1 tablet (150 mg total) by mouth daily.   cholecalciferol 25 MCG (1000 UNIT) tablet Commonly known as: VITAMIN D3 Take 2,000 Units by mouth daily.   escitalopram 20 MG tablet Commonly known as: LEXAPRO Take 1 tablet (20 mg total) by mouth daily.   fluticasone 50 MCG/ACT nasal spray Commonly known  as: FLONASE Place 2 sprays into both nostrils daily.   hydrochlorothiazide 12.5 MG capsule Commonly known as: MICROZIDE Take 1 capsule (12.5 mg total) by mouth daily.   Iron (Ferrous Sulfate) 325 (65 Fe) MG Tabs Take 325 mg by mouth daily.   levothyroxine 75 MCG tablet Commonly known as: SYNTHROID Take 1 tablet (75 mcg total) by mouth daily.   losartan 25 MG tablet Commonly known as: COZAAR Take 0.5 tablets (12.5 mg total) by mouth daily.   pantoprazole 40 MG tablet Commonly known as: PROTONIX Take 40 mg by mouth daily.   rosuvastatin 10 MG tablet Commonly known as: CRESTOR Take 1 tablet (10 mg total) by mouth daily.         Objective:   BP (!) 145/73   Pulse 69   Temp (!) 97.4 F (36.3 C)   Ht 5\' 4"  (1.626 m)   Wt 157 lb (71.2 kg)   SpO2 98%   BMI 26.95 kg/m   Wt Readings from Last 3  Encounters:  06/19/22 157 lb (71.2 kg)  06/01/22 156 lb (70.8 kg)  04/19/22 167 lb (75.8 kg)    Physical Exam Vitals and nursing note reviewed.  Constitutional:      General: She is not in acute distress.    Appearance: She is well-developed. She is not diaphoretic.  Eyes:     Conjunctiva/sclera: Conjunctivae normal.  Cardiovascular:     Rate and Rhythm: Normal rate and regular rhythm.     Heart sounds: Normal heart sounds. No murmur heard. Pulmonary:     Effort: Pulmonary effort is normal. No respiratory distress.     Breath sounds: Normal breath sounds. No wheezing.  Musculoskeletal:        General: No swelling or tenderness. Normal range of motion.  Skin:    General: Skin is warm and dry.     Findings: No rash.  Neurological:     Mental Status: She is alert and oriented to person, place, and time.     Coordination: Coordination normal.  Psychiatric:        Behavior: Behavior normal.       Assessment & Plan:   Problem List Items Addressed This Visit       Other   GAD (generalized anxiety disorder)   Relevant Medications   buPROPion (WELLBUTRIN XL) 150 MG 24 hr tablet   Other Visit Diagnoses     Anemia, unspecified type    -  Primary   Relevant Orders   Anemia Profile B       We will recheck anemia but she seems to be doing well with her anxiety and depression. Follow up plan: Return in about 3 months (around 09/19/2022), or if symptoms worsen or fail to improve, for Anemia and thyroid recheck.  Counseling provided for all of the vaccine components Orders Placed This Encounter  Procedures   Anemia Profile B    Caryl Pina, MD [facility] 06/19/2022, 11:25 AM

## 2022-06-20 LAB — ANEMIA PROFILE B
Basophils Absolute: 0.1 10*3/uL (ref 0.0–0.2)
Basos: 1 %
EOS (ABSOLUTE): 0.2 10*3/uL (ref 0.0–0.4)
Eos: 3 %
Ferritin: 24 ng/mL (ref 15–150)
Folate: 12.3 ng/mL (ref >3.0)
Hematocrit: 36.8 % (ref 34.0–46.6)
Hemoglobin: 11.7 g/dL (ref 11.1–15.9)
Immature Grans (Abs): 0 10*3/uL (ref 0.0–0.1)
Immature Granulocytes: 0 %
Iron Saturation: 44 % (ref 15–55)
Iron: 171 ug/dL — ABNORMAL HIGH (ref 27–139)
Lymphocytes Absolute: 1 10*3/uL (ref 0.7–3.1)
Lymphs: 15 %
MCH: 25.9 pg — ABNORMAL LOW (ref 26.6–33.0)
MCHC: 31.8 g/dL (ref 31.5–35.7)
MCV: 81 fL (ref 79–97)
Monocytes Absolute: 0.4 10*3/uL (ref 0.1–0.9)
Monocytes: 6 %
Neutrophils Absolute: 5.1 10*3/uL (ref 1.4–7.0)
Neutrophils: 75 %
Platelets: 298 10*3/uL (ref 150–450)
RBC: 4.52 x10E6/uL (ref 3.77–5.28)
RDW: 18.2 % — ABNORMAL HIGH (ref 11.7–15.4)
Retic Ct Pct: 0.9 % (ref 0.6–2.6)
Total Iron Binding Capacity: 388 ug/dL (ref 250–450)
UIBC: 217 ug/dL (ref 118–369)
Vitamin B-12: 606 pg/mL (ref 232–1245)
WBC: 6.8 10*3/uL (ref 3.4–10.8)

## 2022-07-03 ENCOUNTER — Ambulatory Visit
Admission: RE | Admit: 2022-07-03 | Discharge: 2022-07-03 | Disposition: A | Discharge status: Home / Self Care | Payer: Medicare Other | Source: Ambulatory Visit | Attending: Family Medicine | Admitting: Family Medicine

## 2022-07-03 DIAGNOSIS — Z1231 Encounter for screening mammogram for malignant neoplasm of breast: Secondary | ICD-10-CM

## 2022-07-06 NOTE — Progress Notes (Deleted)
Referring:  Dettinger, Elige Radon, MD [address] [place],  [place] [address]  PCP: Dettinger, Elige Radon, MD  Neurology was asked to evaluate Isabella Lindsey, a 76 year old female for a chief complaint of headaches.  Our recommendations of care will be communicated by shared medical record.    CC:  headaches  History provided from ***  HPI:  Medical co-morbidities: ***  The patient presents for evaluation of headaches which began***  Headache History: Onset: Triggers: Aura: Location: Quality/Description: Associated Symptoms:  Photophobia: yes  Phonophobia:  Nausea: yes Vomiting: Allodynia: Other symptoms: Worse with activity?: Duration of headaches:  Headache days per month: *** Migraine days per month: *** Headache free days per month: ***  Current Treatment: Abortive ***  Preventative ***  Prior Therapies                                 Losartan 25 mg daily Wellbutrin 150 mg daily Lexapro 20 mg daily Effexor - dizziness  LABS: ***  IMAGING:  ***  ***Imaging independently reviewed on July 06, 2022   Current Outpatient Medications on File Prior to Visit  Medication Sig Dispense Refill   ALPRAZolam (XANAX) 0.25 MG tablet Take 1 tablet (0.25 mg total) by mouth at bedtime as needed for anxiety. 30 tablet 5   buPROPion (WELLBUTRIN XL) 150 MG 24 hr tablet Take 1 tablet (150 mg total) by mouth daily. 90 tablet 1   cholecalciferol (VITAMIN D3) 25 MCG (1000 UNIT) tablet Take 2,000 Units by mouth daily.     escitalopram (LEXAPRO) 20 MG tablet Take 1 tablet (20 mg total) by mouth daily. 90 tablet 3   fluticasone (FLONASE) 50 MCG/ACT nasal spray Place 2 sprays into both nostrils daily. 16 g 6   hydrochlorothiazide (MICROZIDE) 12.5 MG capsule Take 1 capsule (12.5 mg total) by mouth daily. 90 capsule 3   Iron, Ferrous Sulfate, 325 (65 Fe) MG TABS Take 325 mg by mouth daily. 90 tablet 1   levothyroxine (SYNTHROID) 75 MCG tablet Take 1 tablet (75 mcg total) by  mouth daily. 90 tablet 3   losartan (COZAAR) 25 MG tablet Take 0.5 tablets (12.5 mg total) by mouth daily. 45 tablet 3   pantoprazole (PROTONIX) 40 MG tablet Take 40 mg by mouth daily.     rosuvastatin (CRESTOR) 10 MG tablet Take 1 tablet (10 mg total) by mouth daily. 90 tablet 3   No current facility-administered medications on file prior to visit.     Allergies: Allergies  Allergen Reactions   Effexor [Venlafaxine]     Dizziness   Penicillins Itching and Swelling   Erythromycin Rash    Family History: Migraine or other headaches in the family:  *** Aneurysms in a first degree relative:  *** Brain tumors in the family:  *** Other neurological illness in the family:   ***  Past Medical History: Past Medical History:  Diagnosis Date   Allergic rhinitis    Depression    DJD (degenerative joint disease)    cervical and Rt. knee, Rt. 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   History of kidney stones    Hypertension    Hypothyroidism    Insomnia  Occassional   Migraine    Seborrheic keratoses    Scattered seborrheic keratoses    Past Surgical History Past Surgical History:  Procedure Laterality Date   ABDOMINAL HYSTERECTOMY  1986   bilateral cataract surgery      CESAREAN SECTION     right knee replacement      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 Positive for***.  All other systems reviewed and negative unless stated otherwise in HPI.   Physical Exam:   Vital Signs: There were no vitals taken for this visit. GENERAL: well appearing,in no acute distress,alert SKIN:  Color, texture, turgor normal. No rashes or lesions HEAD:  Normocephalic/atraumatic. CV:  RRR RESP: Normal respiratory effort MSK: no tenderness to palpation over occiput, neck, or shoulders  NEUROLOGICAL: Mental Status: Alert, oriented to person, place and time,Follows commands Cranial Nerves: PERRL, visual fields intact to confrontation, extraocular movements intact, facial sensation intact, no facial droop or ptosis, hearing grossly intact, no dysarthria, palate elevate symmetrically, tongue protrudes midline, shoulder shrug intact and symmetric Motor: muscle strength 5/5 both upper and lower extremities,no drift, normal tone Reflexes: 2+ throughout Sensation: intact to light touch all 4 extremities Coordination: Finger-to- nose-finger intact bilaterally Gait: normal-based   IMPRESSION: ***  PLAN: ***   I spent a total of *** minutes chart reviewing and counseling the patient. Headache education was done. Discussed treatment options including preventive and acute medications, natural supplements, and physical therapy. Discussed medication overuse headache and to limit use of acute treatments to no more than 2 days/week or 10 days/month. Discussed medication side effects, adverse reactions and drug interactions. Written educational materials and patient instructions outlining all of the above were given.  Follow-up: ***   Genia Harold, MD 07/06/2022   10:32 AM

## 2022-07-10 ENCOUNTER — Ambulatory Visit: Payer: Medicare Other | Admitting: Psychiatry

## 2022-08-31 ENCOUNTER — Other Ambulatory Visit: Payer: Self-pay | Admitting: Family Medicine

## 2022-09-20 ENCOUNTER — Ambulatory Visit: Payer: Medicare Other | Admitting: Family Medicine

## 2022-09-20 DIAGNOSIS — F411 Generalized anxiety disorder: Secondary | ICD-10-CM

## 2022-09-27 ENCOUNTER — Ambulatory Visit (INDEPENDENT_AMBULATORY_CARE_PROVIDER_SITE_OTHER): Payer: Medicare Other | Admitting: Family Medicine

## 2022-09-27 ENCOUNTER — Encounter: Payer: Self-pay | Admitting: Family Medicine

## 2022-09-27 VITALS — BP 137/77 | HR 80 | Temp 97.3°F | Ht 64.0 in | Wt 158.0 lb

## 2022-09-27 DIAGNOSIS — F411 Generalized anxiety disorder: Secondary | ICD-10-CM | POA: Diagnosis not present

## 2022-09-27 DIAGNOSIS — E039 Hypothyroidism, unspecified: Secondary | ICD-10-CM

## 2022-09-27 DIAGNOSIS — N390 Urinary tract infection, site not specified: Secondary | ICD-10-CM

## 2022-09-27 DIAGNOSIS — I10 Essential (primary) hypertension: Secondary | ICD-10-CM

## 2022-09-27 DIAGNOSIS — E782 Mixed hyperlipidemia: Secondary | ICD-10-CM | POA: Diagnosis not present

## 2022-09-27 DIAGNOSIS — R6889 Other general symptoms and signs: Secondary | ICD-10-CM | POA: Diagnosis not present

## 2022-09-27 DIAGNOSIS — Z79899 Other long term (current) drug therapy: Secondary | ICD-10-CM | POA: Diagnosis not present

## 2022-09-27 LAB — URINALYSIS, COMPLETE
Bilirubin, UA: NEGATIVE
Glucose, UA: NEGATIVE
Ketones, UA: NEGATIVE
Nitrite, UA: NEGATIVE
Protein,UA: NEGATIVE
RBC, UA: NEGATIVE
Specific Gravity, UA: 1.01 (ref 1.005–1.030)
Urobilinogen, Ur: 0.2 mg/dL (ref 0.2–1.0)
pH, UA: 5.5 (ref 5.0–7.5)

## 2022-09-27 LAB — MICROSCOPIC EXAMINATION: RBC, Urine: NONE SEEN /hpf (ref 0–2)

## 2022-09-27 MED ORDER — HYDROCHLOROTHIAZIDE 12.5 MG PO CAPS: 12.5000 mg | ORAL_CAPSULE | Freq: Every day | ORAL | 3 refills | Status: DC

## 2022-09-27 MED ORDER — PANTOPRAZOLE SODIUM 40 MG PO TBEC: 40.0000 mg | DELAYED_RELEASE_TABLET | Freq: Two times a day (BID) | ORAL | 3 refills | Status: DC

## 2022-09-27 MED ORDER — BUPROPION HCL ER (XL) 150 MG PO TB24: 150.0000 mg | ORAL_TABLET | Freq: Every day | ORAL | 3 refills | Status: DC

## 2022-09-27 MED ORDER — ALPRAZOLAM 0.25 MG PO TABS: 0.2500 mg | ORAL_TABLET | Freq: Every evening | ORAL | 5 refills | Status: DC | PRN

## 2022-09-27 MED ORDER — LEVOTHYROXINE SODIUM 75 MCG PO TABS: 75.0000 ug | ORAL_TABLET | Freq: Every day | ORAL | 3 refills | Status: DC

## 2022-09-27 MED ORDER — LOSARTAN POTASSIUM 25 MG PO TABS: 12.5000 mg | ORAL_TABLET | Freq: Every day | ORAL | 3 refills | Status: DC

## 2022-09-27 MED ORDER — ROSUVASTATIN CALCIUM 10 MG PO TABS: 10.0000 mg | ORAL_TABLET | Freq: Every day | ORAL | 3 refills | Status: DC

## 2022-09-27 MED ORDER — ESCITALOPRAM OXALATE 20 MG PO TABS: 20.0000 mg | ORAL_TABLET | Freq: Every day | ORAL | 3 refills | Status: DC

## 2022-09-27 NOTE — Progress Notes (Signed)
BP 137/77   Pulse 80   Temp (!) 97.3 F (36.3 C)   Ht 5\' 4"  (1.626 m)   Wt 158 lb (71.7 kg)   SpO2 95%   BMI 27.12 kg/m    Subjective:   Patient ID: Isabella Lindsey, female    DOB: 1945-11-29, 77 y.o.   MRN: [id number]  HPI: Isabella Lindsey is a 77 y.o. female presenting on 09/27/2022 for Medical Management of Chronic Issues, Hypertension, and Hypothyroidism   HPI Anxiety recheck Current rx-alprazolam 0.25 mg nightly as needed # meds rx-30/month Effectiveness of current meds-works well Adverse reactions form meds-none  Pill count performed-No Last drug screen -11/04/2021 ( high risk q73m, moderate risk q64m, low risk yearly ) Urine drug screen today- Yes Was the [place] reviewed-yes  If yes were their any concerning findings? -None  No flowsheet data found.   Controlled substance contract signed on: Today  Hypothyroidism recheck Patient is coming in for thyroid recheck today as well. They deny any issues with hair changes or heat or cold problems or diarrhea or constipation. They deny any chest pain or palpitations. They are currently on levothyroxine 75 micrograms   Hypertension Patient is currently on losartan and hydrochlorothiazide , and their blood pressure today is 137/77. Patient denies any lightheadedness or dizziness. Patient denies headaches, blurred vision, chest pains, shortness of breath, or weakness. Denies any side effects from medication and is content with current medication.   Hyperlipidemia Patient is coming in for recheck of his hyperlipidemia. The patient is currently taking Crestor. They deny any issues with myalgias or history of liver damage from it. They deny any focal numbness or weakness or chest pain.   Patient has a history of recurrent UTIs and wants to her urine checked  Relevant past medical, surgical, family and social history reviewed and updated as indicated. Interim medical history since our last visit reviewed. Allergies and  medications reviewed and updated.  Review of Systems  Constitutional:  Negative for chills and fever.  Eyes:  Negative for visual disturbance.  Respiratory:  Negative for chest tightness and shortness of breath.   Cardiovascular:  Negative for chest pain and leg swelling.  Genitourinary:  Negative for difficulty urinating, dysuria, flank pain and frequency.  Skin:  Negative for rash.  Neurological:  Negative for light-headedness and headaches.  Psychiatric/Behavioral:  Positive for sleep disturbance. Negative for agitation, behavioral problems, dysphoric mood, self-injury and suicidal ideas. The patient is nervous/anxious.   All other systems reviewed and are negative.   Per HPI unless specifically indicated above   Allergies as of 09/27/2022       Reactions   Effexor [venlafaxine]    Dizziness   Penicillins Itching, Swelling   Erythromycin Rash        Medication List        Accurate as of September 27, 2022  9:12 AM. If you have any questions, ask your nurse or doctor.          ALPRAZolam 0.25 MG tablet Commonly known as: XANAX Take 1 tablet (0.25 mg total) by mouth at bedtime as needed for anxiety.   buPROPion 150 MG 24 hr tablet Commonly known as: Wellbutrin XL Take 1 tablet (150 mg total) by mouth daily.   cholecalciferol 25 MCG (1000 UNIT) tablet Commonly known as: VITAMIN D3 Take 2,000 Units by mouth daily.   escitalopram 20 MG tablet Commonly known as: LEXAPRO Take 1 tablet (20 mg total) by mouth daily.   fluticasone 50 MCG/ACT  nasal spray Commonly known as: FLONASE Place 2 sprays into both nostrils daily.   hydrochlorothiazide 12.5 MG capsule Commonly known as: MICROZIDE Take 1 capsule (12.5 mg total) by mouth daily.   Iron (Ferrous Sulfate) 325 (65 Fe) MG Tabs Take 325 mg by mouth daily.   levothyroxine 75 MCG tablet Commonly known as: SYNTHROID Take 1 tablet (75 mcg total) by mouth daily.   losartan 25 MG tablet Commonly known as:  COZAAR Take 0.5 tablets (12.5 mg total) by mouth daily.   pantoprazole 40 MG tablet Commonly known as: PROTONIX Take 1 tablet (40 mg total) by mouth 2 (two) times daily before a meal.   rosuvastatin 10 MG tablet Commonly known as: CRESTOR Take 1 tablet (10 mg total) by mouth daily.         Objective:   BP 137/77   Pulse 80   Temp (!) 97.3 F (36.3 C)   Ht 5\' 4"  (1.626 m)   Wt 158 lb (71.7 kg)   SpO2 95%   BMI 27.12 kg/m   Wt Readings from Last 3 Encounters:  09/27/22 158 lb (71.7 kg)  06/19/22 157 lb (71.2 kg)  06/01/22 156 lb (70.8 kg)    Physical Exam Vitals and nursing note reviewed.  Constitutional:      General: She is not in acute distress.    Appearance: She is well-developed. She is not diaphoretic.  Eyes:     Conjunctiva/sclera: Conjunctivae normal.  Cardiovascular:     Rate and Rhythm: Normal rate and regular rhythm.     Heart sounds: Normal heart sounds. No murmur heard. Pulmonary:     Effort: Pulmonary effort is normal. No respiratory distress.     Breath sounds: Normal breath sounds. No wheezing.  Musculoskeletal:        General: No swelling or tenderness. Normal range of motion.  Skin:    General: Skin is warm and dry.     Findings: No rash.  Neurological:     Mental Status: She is alert and oriented to person, place, and time.     Coordination: Coordination normal.  Psychiatric:        Behavior: Behavior normal.       Assessment & Plan:   Problem List Items Addressed This Visit       Cardiovascular and Mediastinum   Hypertension   Relevant Medications   hydrochlorothiazide (MICROZIDE) 12.5 MG capsule   losartan (COZAAR) 25 MG tablet   rosuvastatin (CRESTOR) 10 MG tablet     Endocrine   Hypothyroidism - Primary   Relevant Medications   levothyroxine (SYNTHROID) 75 MCG tablet   Other Relevant Orders   TSH   Anemia Profile B     Other   Hyperlipidemia   Relevant Medications   hydrochlorothiazide (MICROZIDE) 12.5 MG capsule    losartan (COZAAR) 25 MG tablet   rosuvastatin (CRESTOR) 10 MG tablet   GAD (generalized anxiety disorder)   Relevant Medications   ALPRAZolam (XANAX) 0.25 MG tablet   buPROPion (WELLBUTRIN XL) 150 MG 24 hr tablet   escitalopram (LEXAPRO) 20 MG tablet   Other Relevant Orders   Anemia Profile B   Other Visit Diagnoses     Controlled substance agreement signed       Relevant Orders   ToxASSURE Select 13 (MW), Urine   Recurrent UTI       Relevant Orders   Urinalysis, Complete       Continue current medicine, no changes, will check blood work. Did UDS and  contract today Follow up plan: Return in about 3 months (around 12/27/2022), or if symptoms worsen or fail to improve, for Thyroid and hypertension and anxiety recheck.  Counseling provided for all of the vaccine components Orders Placed This Encounter  Procedures   ToxASSURE Select 13 (MW), Urine   TSH   Urinalysis, Complete   Anemia Profile B    Arville Care, MD [facility] 09/27/2022, 9:12 AM

## 2022-09-28 LAB — TSH: TSH: 1.36 u[IU]/mL (ref 0.450–4.500)

## 2022-09-28 LAB — ANEMIA PROFILE B
Basophils Absolute: 0.1 10*3/uL (ref 0.0–0.2)
Basos: 1 %
EOS (ABSOLUTE): 0.2 10*3/uL (ref 0.0–0.4)
Eos: 3 %
Ferritin: 31 ng/mL (ref 15–150)
Folate: 9.4 ng/mL (ref >3.0)
Hematocrit: 40.5 % (ref 34.0–46.6)
Hemoglobin: 13 g/dL (ref 11.1–15.9)
Immature Grans (Abs): 0 10*3/uL (ref 0.0–0.1)
Immature Granulocytes: 0 %
Iron Saturation: 32 % (ref 15–55)
Iron: 110 ug/dL (ref 27–139)
Lymphocytes Absolute: 1.4 10*3/uL (ref 0.7–3.1)
Lymphs: 19 %
MCH: 29 pg (ref 26.6–33.0)
MCHC: 32.1 g/dL (ref 31.5–35.7)
MCV: 90 fL (ref 79–97)
Monocytes Absolute: 0.5 10*3/uL (ref 0.1–0.9)
Monocytes: 7 %
Neutrophils Absolute: 5 10*3/uL (ref 1.4–7.0)
Neutrophils: 70 %
Platelets: 261 10*3/uL (ref 150–450)
RBC: 4.48 x10E6/uL (ref 3.77–5.28)
RDW: 13.9 % (ref 11.7–15.4)
Retic Ct Pct: 1 % (ref 0.6–2.6)
Total Iron Binding Capacity: 347 ug/dL (ref 250–450)
UIBC: 237 ug/dL (ref 118–369)
Vitamin B-12: 680 pg/mL (ref 232–1245)
WBC: 7.1 10*3/uL (ref 3.4–10.8)

## 2022-09-30 LAB — TOXASSURE SELECT 13 (MW), URINE

## 2022-10-19 DIAGNOSIS — H2513 Age-related nuclear cataract, bilateral: Secondary | ICD-10-CM | POA: Diagnosis not present

## 2022-10-19 DIAGNOSIS — H40033 Anatomical narrow angle, bilateral: Secondary | ICD-10-CM | POA: Diagnosis not present

## 2022-11-01 ENCOUNTER — Ambulatory Visit (INDEPENDENT_AMBULATORY_CARE_PROVIDER_SITE_OTHER): Payer: Medicare Other | Admitting: Family Medicine

## 2022-11-01 ENCOUNTER — Encounter: Payer: Self-pay | Admitting: Family Medicine

## 2022-11-01 VITALS — BP 136/78 | HR 80 | Temp 97.8°F | Ht 64.0 in | Wt 156.0 lb

## 2022-11-01 DIAGNOSIS — H6592 Unspecified nonsuppurative otitis media, left ear: Secondary | ICD-10-CM

## 2022-11-01 DIAGNOSIS — L249 Irritant contact dermatitis, unspecified cause: Secondary | ICD-10-CM

## 2022-11-01 MED ORDER — FLUTICASONE PROPIONATE 50 MCG/ACT NA SUSP: 2.0000 | Freq: Every day | NASAL | 6 refills | Status: AC

## 2022-11-01 NOTE — Progress Notes (Signed)
BP 136/78   Pulse 80   Temp 97.8 F (36.6 C)   Ht '5\' 4"'$  (1.626 m)   Wt 70.8 kg   SpO2 99%   BMI 26.78 kg/m    Subjective:   Patient ID: Isabella Lindsey, female    DOB: 06-08-1946, 77 y.o.   MRN: [id number]  HPI: Isabella Lindsey is a 77 y.o. female presenting on 11/01/2022 for pruritic rash  for 3-4 days. Patient states it started on her right anterior neck and progressed to her clavicle and chest regions. Patient has tried neosporin, alcohol, hydrogen peroxide, hydrocortisone cream, and triamcinolone cream. Triamcinolone was slightly effective in reducing pruritus. Patient denies taking hot showers and has not tried any new detergents or foods. Patient claims she is allergic to plants, but has not been outside recently. Patient does have a history of CKD and iron deficiency. Patient denies shortness of breath, chest pain, urticaria, GI symptoms, and edema.  Relevant past medical, surgical, family and social history reviewed and updated as indicated. Interim medical history since our last visit reviewed. Allergies and medications reviewed and updated.  Review of Systems  Constitutional:  Negative for chills, fatigue and fever.  HENT:  Negative for congestion, rhinorrhea, sore throat and trouble swallowing.   Eyes:  Negative for visual disturbance.  Respiratory:  Negative for apnea, cough, choking, chest tightness, shortness of breath and wheezing.   Gastrointestinal:  Negative for abdominal pain, constipation, diarrhea, nausea and vomiting.  Genitourinary:  Negative for dysuria and flank pain.  Skin:  Positive for color change (erythematous on right chest region and anterior neck).  Neurological:  Negative for dizziness, weakness, light-headedness, numbness and headaches.  Psychiatric/Behavioral:  Negative for agitation, behavioral problems and confusion.   All other systems reviewed and are negative.   Per HPI unless specifically indicated above   Allergies as of 11/01/2022        Reactions   Effexor [venlafaxine]    Dizziness   Penicillins Itching, Swelling   Erythromycin Rash        Medication List        Accurate as of November 01, 2022 12:53 PM. If you have any questions, ask your nurse or doctor.          ALPRAZolam 0.25 MG tablet Commonly known as: XANAX Take 1 tablet (0.25 mg total) by mouth at bedtime as needed for anxiety.   buPROPion 150 MG 24 hr tablet Commonly known as: Wellbutrin XL Take 1 tablet (150 mg total) by mouth daily.   cholecalciferol 25 MCG (1000 UNIT) tablet Commonly known as: VITAMIN D3 Take 2,000 Units by mouth daily.   escitalopram 20 MG tablet Commonly known as: LEXAPRO Take 1 tablet (20 mg total) by mouth daily.   fluticasone 50 MCG/ACT nasal spray Commonly known as: FLONASE Place 2 sprays into both nostrils daily.   hydrochlorothiazide 12.5 MG capsule Commonly known as: MICROZIDE Take 1 capsule (12.5 mg total) by mouth daily.   Iron (Ferrous Sulfate) 325 (65 Fe) MG Tabs Take 325 mg by mouth daily.   levothyroxine 75 MCG tablet Commonly known as: SYNTHROID Take 1 tablet (75 mcg total) by mouth daily.   losartan 25 MG tablet Commonly known as: COZAAR Take 0.5 tablets (12.5 mg total) by mouth daily.   pantoprazole 40 MG tablet Commonly known as: PROTONIX Take 1 tablet (40 mg total) by mouth 2 (two) times daily before a meal.   rosuvastatin 10 MG tablet Commonly known as: CRESTOR Take 1 tablet (10  mg total) by mouth daily.         Objective:   BP 136/78   Pulse 80   Temp 97.8 F (36.6 C)   Ht '5\' 4"'$  (1.626 m)   Wt 70.8 kg   SpO2 99%   BMI 26.78 kg/m   Wt Readings from Last 3 Encounters:  11/01/22 70.8 kg  09/27/22 71.7 kg  06/19/22 71.2 kg    Physical Exam Constitutional:      Appearance: Normal appearance.  HENT:     Nose: Nose normal.  Eyes:     Conjunctiva/sclera: Conjunctivae normal.  Cardiovascular:     Rate and Rhythm: Normal rate and regular rhythm.     Heart  sounds: Normal heart sounds. No murmur heard. Pulmonary:     Effort: No respiratory distress.     Breath sounds: Normal breath sounds. No wheezing, rhonchi or rales.  Musculoskeletal:     Cervical back: Neck supple. No tenderness.     Right lower leg: No edema.     Left lower leg: No edema.  Lymphadenopathy:     Cervical: No cervical adenopathy.  Skin:    General: Skin is warm and dry.     Findings: Erythema (in right anterior neck and upper chest. Pruritic.) present. No bruising, burn, ecchymosis, rash or wound.  Neurological:     Mental Status: She is alert.  Psychiatric:        Mood and Affect: Mood normal.        Behavior: Behavior normal.    Assessment & Plan:   Problem List Items Addressed This Visit   None Visit Diagnoses     Irritant contact dermatitis, unspecified trigger    -  Primary   Patient will try Benadryl and triamcinolone that she already has and let us know if it does not improve   Fluid level behind tympanic membrane of left ear       Relevant Medications   fluticasone (FLONASE) 50 MCG/ACT nasal spray      Patient instructed to use triamcinolone cream that she had at home twice a day and take benadryl every night for pruritus.  Follow up plan: Return if symptoms worsen or fail to improve.  Counseling provided for all of the vaccine components No orders of the defined types were placed in this encounter.  Summer Millstadt, PA-S2 [facility] 11/01/2022, 1:15 PM  I was personally present for all components of the history, physical exam and/or medical decision making.  I agree with the documentation performed by the PA student and agree with assessment and plan above.  PA student was [employer]. Caryl Pina, MD [facility] 11/08/2022, 8:10 AM    Caryl Pina, MD [facility] 11/01/2022, 12:53 PM

## 2022-12-27 ENCOUNTER — Ambulatory Visit: Payer: Medicare Other | Admitting: Family Medicine

## 2023-01-16 DIAGNOSIS — M170 Bilateral primary osteoarthritis of knee: Secondary | ICD-10-CM | POA: Diagnosis not present

## 2023-02-16 ENCOUNTER — Ambulatory Visit (INDEPENDENT_AMBULATORY_CARE_PROVIDER_SITE_OTHER): Payer: Medicare Other

## 2023-02-16 VITALS — Ht 64.0 in | Wt 150.0 lb

## 2023-02-16 DIAGNOSIS — Z0000 Encounter for general adult medical examination without abnormal findings: Secondary | ICD-10-CM | POA: Diagnosis not present

## 2023-02-16 NOTE — Progress Notes (Signed)
Subjective:   Isabella Lindsey is a 77 y.o. female who presents for Medicare Annual (Subsequent) preventive examination. I connected with  [name] on 02/16/23 by a audio enabled telemedicine application and verified that I am speaking with the correct person using two identifiers.  Patient Location: Home  Provider Location: Home Office  I discussed the limitations of evaluation and management by telemedicine. The patient expressed understanding and agreed to proceed.  Review of Systems     Cardiac Risk Factors include: advanced age (>66men, >34 women);hypertension;dyslipidemia     Objective:    Today's Vitals   02/16/23 0846  Weight: 150 lb (68 kg)  Height: 5\' 4"  (1.626 m)   Body mass index is 25.75 kg/m.     02/16/2023    8:48 AM 02/14/2022   10:58 AM 01/03/2022   10:20 AM 02/11/2021   10:46 AM 02/11/2020   10:23 AM 01/20/2019    8:38 AM  Advanced Directives  Does Patient Have a Medical Advance Directive? Yes Yes Yes Yes Yes Yes  Type of Estate agent of [place];Living will Healthcare Power of [place];Living will Healthcare Power of [place];Living will Healthcare Power of [place];Living will Healthcare Power of [place];Living will Healthcare Power of [place];Living will  Does patient want to make changes to medical advance directive?     No - Patient declined No - Patient declined  Copy of Healthcare Power of Attorney in Chart? No - copy requested No - copy requested  No - copy requested  No - copy requested    Current Medications (verified) Outpatient Encounter Medications as of 02/16/2023  Medication Sig   ALPRAZolam (XANAX) 0.25 MG tablet Take 1 tablet (0.25 mg total) by mouth at bedtime as needed for anxiety.   buPROPion (WELLBUTRIN XL) 150 MG 24 hr tablet Take 1 tablet (150 mg total) by mouth daily.   cholecalciferol (VITAMIN D3) 25 MCG (1000 UNIT) tablet Take 2,000 Units by mouth daily.   escitalopram (LEXAPRO) 20 MG tablet Take 1  tablet (20 mg total) by mouth daily.   fluticasone (FLONASE) 50 MCG/ACT nasal spray Place 2 sprays into both nostrils daily.   hydrochlorothiazide (MICROZIDE) 12.5 MG capsule Take 1 capsule (12.5 mg total) by mouth daily.   Iron, Ferrous Sulfate, 325 (65 Fe) MG TABS Take 325 mg by mouth daily.   levothyroxine (SYNTHROID) 75 MCG tablet Take 1 tablet (75 mcg total) by mouth daily.   losartan (COZAAR) 25 MG tablet Take 0.5 tablets (12.5 mg total) by mouth daily.   pantoprazole (PROTONIX) 40 MG tablet Take 1 tablet (40 mg total) by mouth 2 (two) times daily before a meal.   rosuvastatin (CRESTOR) 10 MG tablet Take 1 tablet (10 mg total) by mouth daily.   No facility-administered encounter medications on file as of 02/16/2023.    Allergies (verified) Effexor [venlafaxine], Penicillins, and Erythromycin   History: Past Medical History:  Diagnosis Date   Allergic rhinitis    Depression    DJD (degenerative joint disease)    cervical and Rt. knee, Rt. 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   History of kidney stones    Hypertension    Hypothyroidism    Insomnia    Occassional   Migraine    Seborrheic keratoses  Scattered seborrheic keratoses   Past Surgical History:  Procedure Laterality Date   ABDOMINAL HYSTERECTOMY  1986   bilateral cataract surgery      CESAREAN SECTION     right knee replacement      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   Procedure: TOTAL KNEE ARTHROPLASTY;  Surgeon: Joen Rayona Sardinha, MD;  Location: [facility];  Service: Orthopedics;  Laterality: Left;   Family History  Problem Relation Age of Onset   Cancer Father        lung   Arthritis Sister    Breast cancer Neg Hx    Social History   Socioeconomic History   Marital status:  Married    Spouse name: Lollie Sails   Number of children: 3   Years of education: Not on file   Highest education level: 12th grade  Occupational History   Occupation: Retired  Tobacco Use   Smoking status: Never   Smokeless tobacco: Never  Vaping Use   Vaping Use: Never used  Substance and Sexual Activity   Alcohol use: Never   Drug use: Never   Sexual activity: Not Currently  Other Topics Concern   Not on file  Social History Narrative   Lives at home with husband. One son lives in [place].   Social Determinants of Health   Financial Resource Strain: Low Risk  (02/16/2023)   Overall Financial Resource Strain (CARDIA)    Difficulty of Paying Living Expenses: Not hard at all  Food Insecurity: No Food Insecurity (02/16/2023)   Hunger Vital Sign    Worried About Running Out of Food in the Last Year: Never true    Ran Out of Food in the Last Year: Never true  Transportation Needs: No Transportation Needs (02/16/2023)   PRAPARE - Administrator, Civil Service (Medical): No    Lack of Transportation (Non-Medical): No  Physical Activity: Insufficiently Active (02/16/2023)   Exercise Vital Sign    Days of Exercise per Week: 3 days    Minutes of Exercise per Session: 30 min  Stress: No Stress Concern Present (02/16/2023)   [employer] of Occupational Health - Occupational Stress Questionnaire    Feeling of Stress : Not at all  Social Connections: Moderately Integrated (02/16/2023)   Social Connection and Isolation Panel [NHANES]    Frequency of Communication with Friends and Family: More than three times a week    Frequency of Social Gatherings with Friends and Family: More than three times a week    Attends Religious Services: More than 4 times per year    Active Member of [employer] or Organizations: No    Attends Engineer, structural: Never    Marital Status: Married    Tobacco Counseling Counseling given: Not Answered   Clinical Intake:  Pre-visit  preparation completed: Yes  Pain : No/denies pain     Nutritional Risks: None Diabetes: No  How often do you need to have someone help you when you read instructions, pamphlets, or other written materials from your doctor or pharmacy?: 1 - Never  Diabetic?no   Interpreter Needed?: No  Information entered by :: Renie Ora, LPN   Activities of Daily Living    02/16/2023    8:48 AM  In your present state of health, do you have any difficulty performing the following activities:  Hearing? 0  Vision? 0  Difficulty concentrating or making decisions? 0  Walking or climbing stairs? 0  Dressing or bathing? 0  Doing errands, shopping? 0  Preparing Food and eating ? N  Using the Toilet? N  In the past six months, have you accidently leaked urine? N  Do you have problems with loss of bowel control? N  Managing your Medications? N  Managing your Finances? N  Housekeeping or managing your Housekeeping? N    Patient Care Team: Dettinger, Elige Radon, MD as PCP - General (Family Medicine) Danella Maiers, [facility] (Pharmacist) Sallyanne Havers Roseanne Reno, PA-C (Gastroenterology) Michaelle Copas, MD as Referring Physician (Optometry)  Indicate any recent Medical Services you may have received from other than [facility] providers in the past year (date may be approximate).     Assessment:   This is a routine wellness examination for [place].  Hearing/Vision screen Vision Screening - Comments:: Wears rx glasses - up to date with routine eye exams with  Dr.Lee   Dietary issues and exercise activities discussed: Current Exercise Habits: Home exercise routine, Type of exercise: walking, Time (Minutes): 30, Frequency (Times/Week): 3, Weekly Exercise (Minutes/Week): 90, Intensity: Mild, Exercise limited by: orthopedic condition(s)   Goals Addressed             This Visit's Progress    Exercise 3x per week (30 min per time)   On track      Depression Screen    02/16/2023    8:47 AM 11/01/2022    12:29 PM 09/27/2022    8:53 AM 06/19/2022   11:10 AM 06/01/2022    8:50 AM 04/19/2022   12:02 PM 02/14/2022   10:37 AM  PHQ 2/9 Scores  PHQ - 2 Score 0 3 2 2 3 2 2   PHQ- 9 Score  10 6 5 9 7 4     Fall Risk    02/16/2023    8:46 AM 11/01/2022   12:29 PM 09/27/2022    8:53 AM 06/19/2022   11:10 AM 06/01/2022    8:50 AM  Fall Risk   Falls in the past year? 0 0 0 0 0  Number falls in past yr: 0      Injury with Fall? 0      Risk for fall due to : No Fall Risks      Follow up Falls prevention discussed        FALL RISK PREVENTION PERTAINING TO THE HOME:  Any stairs in or around the home? Yes  If so, are there any without handrails? No  Home free of loose throw rugs in walkways, pet beds, electrical cords, etc? Yes  Adequate lighting in your home to reduce risk of falls? Yes   ASSISTIVE DEVICES UTILIZED TO PREVENT FALLS:  Life alert? No  Use of a cane, walker or w/c? Yes  Grab bars in the bathroom? Yes  Shower chair or bench in shower? Yes  Elevated toilet seat or a handicapped toilet? Yes          02/16/2023    8:49 AM 02/14/2022   10:39 AM 02/11/2020   10:26 AM 01/20/2019    8:39 AM  6CIT Screen  What Year? 0 points 0 points 0 points 0 points  What month? 0 points 0 points 0 points 0 points  What time? 0 points 0 points 0 points 0 points  Count back from 20 0 points 0 points 0 points 0 points  Months in reverse 0 points 0 points 0 points 0 points  Repeat phrase 0 points 0 points 0 points 0 points  Total Score 0 points 0 points 0 points 0 points  Immunizations Immunization History  Administered Date(s) Administered   Fluad Quad(high Dose 65+) 06/03/2019, 06/18/2020, 05/19/2021, 05/09/2022   Influenza Split 07/27/2010, 07/20/2011, 09/02/2012, 06/18/2013, 06/23/2015, 05/24/2016   Influenza, High Dose Seasonal PF 06/13/2017, 06/26/2018   Moderna Sars-Covid-2 Vaccination 10/18/2019, 11/15/2019, 07/07/2020, 06/22/2021   Pneumococcal Conjugate-13 08/24/2014    Pneumococcal Polysaccharide-23 08/10/2011, 09/19/2019   Tdap 09/17/2006, 02/17/2021   Zoster Recombinat (Shingrix) 11/25/2021, 04/19/2022    TDAP status: Up to date  Flu Vaccine status: Up to date  Pneumococcal vaccine status: Up to date  Covid-19 vaccine status: Completed vaccines  Qualifies for Shingles Vaccine? Yes   Zostavax completed Yes   Shingrix Completed?: Yes  Screening Tests Health Maintenance  Topic Date Due   COVID-19 Vaccine (5 - 2023-24 season) 05/05/2022   INFLUENZA VACCINE  04/05/2023   MAMMOGRAM  07/04/2023   Medicare Annual Wellness (AWV)  02/16/2024   DTaP/Tdap/Td (3 - Td or Tdap) 02/18/2031   Pneumonia Vaccine 2+ Years old  Completed   DEXA SCAN  Completed   Hepatitis C Screening  Completed   Zoster Vaccines- Shingrix  Completed   HPV VACCINES  Aged Out   Fecal DNA (Cologuard)  Discontinued    Health Maintenance  Health Maintenance Due  Topic Date Due   COVID-19 Vaccine (5 - 2023-24 season) 05/05/2022    Colorectal cancer screening: No longer required.   Mammogram status: No longer required due to age.  Bone Density status: Completed 02/18/2021. Results reflect: Bone density results: OSTEOPENIA. Repeat every 5 years.  Lung Cancer Screening: (Low Dose CT Chest recommended if Age 61-80 years, 30 pack-year currently smoking OR have quit w/in 15years.) does not qualify.   Lung Cancer Screening Referral: n/a  Additional Screening:  Hepatitis C Screening: does not qualify; Completed 05/27/2018  Vision Screening: Recommended annual ophthalmology exams for early detection of glaucoma and other disorders of the eye. Is the patient up to date with their annual eye exam?  Yes  Who is the provider or what is the name of the office in which the patient attends annual eye exams? Dr.Lee  If pt is not established with a provider, would they like to be referred to a provider to establish care? No .   Dental Screening: Recommended annual dental exams for  proper oral hygiene  Community Resource Referral / Chronic Care Management: CRR required this visit?  No   CCM required this visit?  No      Plan:     I have personally reviewed and noted the following in the patient's chart:   Medical and social history Use of alcohol, tobacco or illicit drugs  Current medications and supplements including opioid prescriptions. Patient is not currently taking opioid prescriptions. Functional ability and status Nutritional status Physical activity Advanced directives List of other physicians Hospitalizations, surgeries, and ER visits in previous 12 months Vitals Screenings to include cognitive, depression, and falls Referrals and appointments  In addition, I have reviewed and discussed with patient certain preventive protocols, quality metrics, and best practice recommendations. A written personalized care plan for preventive services as well as general preventive health recommendations were provided to patient.     Lorrene Reid, LPN   [id number]/61/[id number]   Nurse Notes: none

## 2023-02-16 NOTE — Patient Instructions (Signed)
Isabella Lindsey , Thank you for taking time to come for your Medicare Wellness Visit. I appreciate your ongoing commitment to your health goals. Please review the following plan we discussed and let me know if I can assist you in the future.   These are the goals we discussed:  Goals      Exercise 3x per week (30 min per time)     Prevent falls        This is a list of the screening recommended for you and due dates:  Health Maintenance  Topic Date Due   COVID-19 Vaccine (5 - 2023-24 season) 05/05/2022   Flu Shot  04/05/2023   Mammogram  07/04/2023   Medicare Annual Wellness Visit  02/16/2024   DTaP/Tdap/Td vaccine (3 - Td or Tdap) 02/18/2031   Pneumonia Vaccine  Completed   DEXA scan (bone density measurement)  Completed   Hepatitis C Screening  Completed   Zoster (Shingles) Vaccine  Completed   HPV Vaccine  Aged Out   Cologuard (Stool DNA test)  Discontinued    Advanced directives: Please bring a copy of your health care power of attorney and living will to the office to be added to your chart at your convenience.   Conditions/risks identified: Aim for 30 minutes of exercise or brisk walking, 6-8 glasses of water, and 5 servings of fruits and vegetables each day.   Next appointment: Follow up in one year for your annual wellness visit    Preventive Care 65 Years and Older, Female Preventive care refers to lifestyle choices and visits with your health care provider that can promote health and wellness. What does preventive care include? A yearly physical exam. This is also called an annual well check. Dental exams once or twice a year. Routine eye exams. Ask your health care provider how often you should have your eyes checked. Personal lifestyle choices, including: Daily care of your teeth and gums. Regular physical activity. Eating a healthy diet. Avoiding tobacco and drug use. Limiting alcohol use. Practicing safe sex. Taking low-dose aspirin every day. Taking vitamin  and mineral supplements as recommended by your health care provider. What happens during an annual well check? The services and screenings done by your health care provider during your annual well check will depend on your age, overall health, lifestyle risk factors, and family history of disease. Counseling  Your health care provider may ask you questions about your: Alcohol use. Tobacco use. Drug use. Emotional well-being. Home and relationship well-being. Sexual activity. Eating habits. History of falls. Memory and ability to understand (cognition). Work and work Astronomer. Reproductive health. Screening  You may have the following tests or measurements: Height, weight, and BMI. Blood pressure. Lipid and cholesterol levels. These may be checked every 5 years, or more frequently if you are over 41 years old. Skin check. Lung cancer screening. You may have this screening every year starting at age 38 if you have a 30-pack-year history of smoking and currently smoke or have quit within the past 15 years. Fecal occult blood test (FOBT) of the stool. You may have this test every year starting at age 75. Flexible sigmoidoscopy or colonoscopy. You may have a sigmoidoscopy every 5 years or a colonoscopy every 10 years starting at age 57. Hepatitis C blood test. Hepatitis B blood test. Sexually transmitted disease (STD) testing. Diabetes screening. This is done by checking your blood sugar (glucose) after you have not eaten for a while (fasting). You may have this done every  1-3 years. Bone density scan. This is done to screen for osteoporosis. You may have this done starting at age 8. Mammogram. This may be done every 1-2 years. Talk to your health care provider about how often you should have regular mammograms. Talk with your health care provider about your test results, treatment options, and if necessary, the need for more tests. Vaccines  Your health care provider may recommend  certain vaccines, such as: Influenza vaccine. This is recommended every year. Tetanus, diphtheria, and acellular pertussis (Tdap, Td) vaccine. You may need a Td booster every 10 years. Zoster vaccine. You may need this after age 58. Pneumococcal 13-valent conjugate (PCV13) vaccine. One dose is recommended after age 65. Pneumococcal polysaccharide (PPSV23) vaccine. One dose is recommended after age 79. Talk to your health care provider about which screenings and vaccines you need and how often you need them. This information is not intended to replace advice given to you by your health care provider. Make sure you discuss any questions you have with your health care provider. Document Released: 09/17/2015 Document Revised: 05/10/2016 Document Reviewed: 06/22/2015 Elsevier Interactive Patient Education  2017 [employer] Prevention in the Home Falls can cause injuries. They can happen to people of all ages. There are many things you can do to make your home safe and to help prevent falls. What can I do on the outside of my home? Regularly fix the edges of walkways and driveways and fix any cracks. Remove anything that might make you trip as you walk through a door, such as a raised step or threshold. Trim any bushes or trees on the path to your home. Use bright outdoor lighting. Clear any walking paths of anything that might make someone trip, such as rocks or tools. Regularly check to see if handrails are loose or broken. Make sure that both sides of any steps have handrails. Any raised decks and porches should have guardrails on the edges. Have any leaves, snow, or ice cleared regularly. Use sand or salt on walking paths during winter. Clean up any spills in your garage right away. This includes oil or grease spills. What can I do in the bathroom? Use night lights. Install grab bars by the toilet and in the tub and shower. Do not use towel bars as grab bars. Use non-skid mats or  decals in the tub or shower. If you need to sit down in the shower, use a plastic, non-slip stool. Keep the floor dry. Clean up any water that spills on the floor as soon as it happens. Remove soap buildup in the tub or shower regularly. Attach bath mats securely with double-sided non-slip rug tape. Do not have throw rugs and other things on the floor that can make you trip. What can I do in the bedroom? Use night lights. Make sure that you have a light by your bed that is easy to reach. Do not use any sheets or blankets that are too big for your bed. They should not hang down onto the floor. Have a firm chair that has side arms. You can use this for support while you get dressed. Do not have throw rugs and other things on the floor that can make you trip. What can I do in the kitchen? Clean up any spills right away. Avoid walking on wet floors. Keep items that you use a lot in easy-to-reach places. If you need to reach something above you, use a strong step stool that has a  grab bar. Keep electrical cords out of the way. Do not use floor polish or wax that makes floors slippery. If you must use wax, use non-skid floor wax. Do not have throw rugs and other things on the floor that can make you trip. What can I do with my stairs? Do not leave any items on the stairs. Make sure that there are handrails on both sides of the stairs and use them. Fix handrails that are broken or loose. Make sure that handrails are as long as the stairways. Check any carpeting to make sure that it is firmly attached to the stairs. Fix any carpet that is loose or worn. Avoid having throw rugs at the top or bottom of the stairs. If you do have throw rugs, attach them to the floor with carpet tape. Make sure that you have a light switch at the top of the stairs and the bottom of the stairs. If you do not have them, ask someone to add them for you. What else can I do to help prevent falls? Wear shoes that: Do not  have high heels. Have rubber bottoms. Are comfortable and fit you well. Are closed at the toe. Do not wear sandals. If you use a stepladder: Make sure that it is fully opened. Do not climb a closed stepladder. Make sure that both sides of the stepladder are locked into place. Ask someone to hold it for you, if possible. Clearly mark and make sure that you can see: Any grab bars or handrails. First and last steps. Where the edge of each step is. Use tools that help you move around (mobility aids) if they are needed. These include: Canes. Walkers. Scooters. Crutches. Turn on the lights when you go into a dark area. Replace any light bulbs as soon as they burn out. Set up your furniture so you have a clear path. Avoid moving your furniture around. If any of your floors are uneven, fix them. If there are any pets around you, be aware of where they are. Review your medicines with your doctor. Some medicines can make you feel dizzy. This can increase your chance of falling. Ask your doctor what other things that you can do to help prevent falls. This information is not intended to replace advice given to you by your health care provider. Make sure you discuss any questions you have with your health care provider. Document Released: 06/17/2009 Document Revised: 01/27/2016 Document Reviewed: 09/25/2014 Elsevier Interactive Patient Education  2017 [employer].

## 2023-02-21 DIAGNOSIS — D485 Neoplasm of uncertain behavior of skin: Secondary | ICD-10-CM | POA: Diagnosis not present

## 2023-02-21 DIAGNOSIS — L986 Other infiltrative disorders of the skin and subcutaneous tissue: Secondary | ICD-10-CM | POA: Diagnosis not present

## 2023-02-21 DIAGNOSIS — L309 Dermatitis, unspecified: Secondary | ICD-10-CM | POA: Diagnosis not present

## 2023-03-02 ENCOUNTER — Ambulatory Visit: Payer: Medicare Other | Admitting: Family Medicine

## 2023-03-21 DIAGNOSIS — L570 Actinic keratosis: Secondary | ICD-10-CM | POA: Diagnosis not present

## 2023-03-21 DIAGNOSIS — D485 Neoplasm of uncertain behavior of skin: Secondary | ICD-10-CM | POA: Diagnosis not present

## 2023-03-26 ENCOUNTER — Ambulatory Visit: Payer: Medicare Other | Admitting: Family Medicine

## 2023-04-04 ENCOUNTER — Ambulatory Visit (INDEPENDENT_AMBULATORY_CARE_PROVIDER_SITE_OTHER): Payer: Medicare Other | Admitting: Family Medicine

## 2023-04-04 ENCOUNTER — Other Ambulatory Visit: Payer: Self-pay | Admitting: Family Medicine

## 2023-04-04 ENCOUNTER — Encounter: Payer: Self-pay | Admitting: Family Medicine

## 2023-04-04 VITALS — BP 122/74 | HR 84 | Ht 64.0 in | Wt 152.0 lb

## 2023-04-04 DIAGNOSIS — Z1211 Encounter for screening for malignant neoplasm of colon: Secondary | ICD-10-CM | POA: Diagnosis not present

## 2023-04-04 DIAGNOSIS — E039 Hypothyroidism, unspecified: Secondary | ICD-10-CM

## 2023-04-04 DIAGNOSIS — F411 Generalized anxiety disorder: Secondary | ICD-10-CM | POA: Diagnosis not present

## 2023-04-04 DIAGNOSIS — I10 Essential (primary) hypertension: Secondary | ICD-10-CM | POA: Diagnosis not present

## 2023-04-04 DIAGNOSIS — E782 Mixed hyperlipidemia: Secondary | ICD-10-CM | POA: Diagnosis not present

## 2023-04-04 MED ORDER — ALPRAZOLAM 0.25 MG PO TABS
0.2500 mg | ORAL_TABLET | Freq: Every evening | ORAL | 5 refills | Status: DC | PRN
Start: 2023-04-04 — End: 2023-09-11

## 2023-04-04 NOTE — Addendum Note (Signed)
Addended by: Arville Care on: 04/04/2023 10:30 AM   Modules accepted: Orders

## 2023-04-04 NOTE — Progress Notes (Signed)
BP 122/74   Pulse 84   Ht 5\' 4"  (1.626 m)   Wt 152 lb (68.9 kg)   SpO2 99%   BMI 26.09 kg/m    Subjective:   Patient ID: Isabella Lindsey, female    DOB: June 20, 1946, 77 y.o.   MRN: [id number]  HPI: Isabella Lindsey is a 77 y.o. female presenting on 04/04/2023 for Medical Management of Chronic Issues, Hypothyroidism, and Hypertension   HPI Hypertension Patient is currently on hydrochlorothiazide and losartan, and their blood pressure today is 122/74. Patient denies any lightheadedness or dizziness. Patient denies headaches, blurred vision, chest pains, shortness of breath, or weakness. Denies any side effects from medication and is content with current medication.   Hypothyroidism recheck Patient is coming in for thyroid recheck today as well. They deny any issues with hair changes or heat or cold problems or diarrhea or constipation. They deny any chest pain or palpitations. They are currently on levothyroxine 75 micrograms   Hyperlipidemia Patient is coming in for recheck of his hyperlipidemia. The patient is currently taking Crestor. They deny any issues with myalgias or history of liver damage from it. They deny any focal numbness or weakness or chest pain.   Anxiety recheck Current rx-Lexapro and Wellbutrin and alprazolam 0.25 mg nightly as needed # meds rx-30/month Effectiveness of current meds-works well Adverse reactions form meds-none  Pill count performed-No Last drug screen -10/05/2022 ( high risk q61m, moderate risk q6m, low risk yearly ) Urine drug screen today- No Was the [place] reviewed- yes  If yes were their any concerning findings? - no  No flowsheet data found.   Controlled substance contract signed on: 10/05/2022  Relevant past medical, surgical, family and social history reviewed and updated as indicated. Interim medical history since our last visit reviewed. Allergies and medications reviewed and updated.  Review of Systems  Constitutional:  Negative  for chills and fever.  Eyes:  Negative for redness and visual disturbance.  Respiratory:  Negative for chest tightness and shortness of breath.   Cardiovascular:  Negative for chest pain and leg swelling.  Musculoskeletal:  Negative for back pain and gait problem.  Skin:  Negative for rash.  Neurological:  Negative for dizziness, light-headedness and headaches.  Psychiatric/Behavioral:  Positive for dysphoric mood. Negative for agitation, behavioral problems, self-injury, sleep disturbance and suicidal ideas. The patient is nervous/anxious.   All other systems reviewed and are negative.   Per HPI unless specifically indicated above   Allergies as of 04/04/2023       Reactions   Effexor [venlafaxine]    Dizziness   Penicillins Itching, Swelling   Erythromycin Rash        Medication List        Accurate as of April 04, 2023 10:22 AM. If you have any questions, ask your nurse or doctor.          ALPRAZolam 0.25 MG tablet Commonly known as: XANAX Take 1 tablet (0.25 mg total) by mouth at bedtime as needed for anxiety.   buPROPion 150 MG 24 hr tablet Commonly known as: Wellbutrin XL Take 1 tablet (150 mg total) by mouth daily.   cholecalciferol 25 MCG (1000 UNIT) tablet Commonly known as: VITAMIN D3 Take 2,000 Units by mouth daily.   escitalopram 20 MG tablet Commonly known as: LEXAPRO Take 1 tablet (20 mg total) by mouth daily.   fluticasone 50 MCG/ACT nasal spray Commonly known as: FLONASE Place 2 sprays into both nostrils daily.   hydrochlorothiazide 12.5  MG capsule Commonly known as: MICROZIDE Take 1 capsule (12.5 mg total) by mouth daily.   Iron (Ferrous Sulfate) 325 (65 Fe) MG Tabs Take 325 mg by mouth daily.   levothyroxine 75 MCG tablet Commonly known as: SYNTHROID Take 1 tablet (75 mcg total) by mouth daily.   losartan 25 MG tablet Commonly known as: COZAAR Take 0.5 tablets (12.5 mg total) by mouth daily.   pantoprazole 40 MG tablet Commonly  known as: PROTONIX Take 1 tablet (40 mg total) by mouth 2 (two) times daily before a meal.   rosuvastatin 10 MG tablet Commonly known as: CRESTOR Take 1 tablet (10 mg total) by mouth daily.         Objective:   BP 122/74   Pulse 84   Ht 5\' 4"  (1.626 m)   Wt 152 lb (68.9 kg)   SpO2 99%   BMI 26.09 kg/m   Wt Readings from Last 3 Encounters:  04/04/23 152 lb (68.9 kg)  02/16/23 150 lb (68 kg)  11/01/22 156 lb (70.8 kg)    Physical Exam Vitals and nursing note reviewed.  Constitutional:      General: She is not in acute distress.    Appearance: She is well-developed. She is not diaphoretic.  Eyes:     Conjunctiva/sclera: Conjunctivae normal.  Cardiovascular:     Rate and Rhythm: Normal rate and regular rhythm.     Heart sounds: Normal heart sounds. No murmur heard. Pulmonary:     Effort: Pulmonary effort is normal. No respiratory distress.     Breath sounds: Normal breath sounds. No wheezing.  Abdominal:     General: Abdomen is flat. Bowel sounds are normal. There is no distension.     Tenderness: There is no abdominal tenderness. There is no guarding or rebound.     Hernia: No hernia is present.  Musculoskeletal:        General: Swelling (trace) present. No tenderness. Normal range of motion.  Skin:    General: Skin is warm and dry.     Findings: No rash.  Neurological:     Mental Status: She is alert and oriented to person, place, and time.     Coordination: Coordination normal.  Psychiatric:        Behavior: Behavior normal.       Assessment & Plan:   Problem List Items Addressed This Visit       Cardiovascular and Mediastinum   Hypertension   Relevant Orders   CBC with Differential/Platelet   CMP14+EGFR   Lipid panel   TSH     Endocrine   Hypothyroidism - Primary   Relevant Orders   CBC with Differential/Platelet   CMP14+EGFR   Lipid panel   TSH     Other   Hyperlipidemia   Relevant Orders   CBC with Differential/Platelet   CMP14+EGFR    Lipid panel   TSH   GAD (generalized anxiety disorder)   Relevant Medications   ALPRAZolam (XANAX) 0.25 MG tablet    Sounds like she has a little bit of congestion and a little bit of heartburn at night, recommended that she take Zyrtec in the evenings daily and then also increase her Protonix to take it twice daily, she says she is only taking it once daily right now. Follow up plan: Return in about 6 months (around 10/05/2023), or if symptoms worsen or fail to improve, for Anxiety and hypertension and thyroid recheck.  Counseling provided for all of the vaccine components Orders Placed  This Encounter  Procedures   CBC with Differential/Platelet   CMP14+EGFR   Lipid panel   TSH    Arville Care, MD [facility] 04/04/2023, 10:22 AM

## 2023-04-05 LAB — CMP14+EGFR
ALT: 17 IU/L (ref 0–32)
AST: 23 IU/L (ref 0–40)
Albumin: 4.3 g/dL (ref 3.8–4.8)
Alkaline Phosphatase: 79 IU/L (ref 44–121)
BUN/Creatinine Ratio: 20 (ref 12–28)
BUN: 25 mg/dL (ref 8–27)
Bilirubin Total: 0.4 mg/dL (ref 0.0–1.2)
CO2: 25 mmol/L (ref 20–29)
Calcium: 10.1 mg/dL (ref 8.7–10.3)
Chloride: 101 mmol/L (ref 96–106)
Creatinine, Ser: 1.23 mg/dL — ABNORMAL HIGH (ref 0.57–1.00)
Globulin, Total: 2.4 g/dL (ref 1.5–4.5)
Glucose: 105 mg/dL — ABNORMAL HIGH (ref 70–99)
Potassium: 3.3 mmol/L — ABNORMAL LOW (ref 3.5–5.2)
Sodium: 141 mmol/L (ref 134–144)
Total Protein: 6.7 g/dL (ref 6.0–8.5)
eGFR: 46 mL/min/{1.73_m2} — ABNORMAL LOW (ref >59)

## 2023-04-05 LAB — CBC WITH DIFFERENTIAL/PLATELET
Basophils Absolute: 0.1 10*3/uL (ref 0.0–0.2)
Basos: 1 %
EOS (ABSOLUTE): 0.6 10*3/uL — ABNORMAL HIGH (ref 0.0–0.4)
Eos: 8 %
Hematocrit: 40.7 % (ref 34.0–46.6)
Hemoglobin: 13.5 g/dL (ref 11.1–15.9)
Immature Grans (Abs): 0 10*3/uL (ref 0.0–0.1)
Immature Granulocytes: 0 %
Lymphocytes Absolute: 1.3 10*3/uL (ref 0.7–3.1)
Lymphs: 18 %
MCH: 30.1 pg (ref 26.6–33.0)
MCHC: 33.2 g/dL (ref 31.5–35.7)
MCV: 91 fL (ref 79–97)
Monocytes Absolute: 0.5 10*3/uL (ref 0.1–0.9)
Monocytes: 7 %
Neutrophils Absolute: 4.9 10*3/uL (ref 1.4–7.0)
Neutrophils: 66 %
Platelets: 276 10*3/uL (ref 150–450)
RBC: 4.48 x10E6/uL (ref 3.77–5.28)
RDW: 12.8 % (ref 11.7–15.4)
WBC: 7.4 10*3/uL (ref 3.4–10.8)

## 2023-04-05 LAB — LIPID PANEL
Chol/HDL Ratio: 2.2 ratio (ref 0.0–4.4)
Cholesterol, Total: 122 mg/dL (ref 100–199)
HDL: 55 mg/dL (ref >39)
LDL Chol Calc (NIH): 50 mg/dL (ref 0–99)
Triglycerides: 87 mg/dL (ref 0–149)
VLDL Cholesterol Cal: 17 mg/dL (ref 5–40)

## 2023-04-05 LAB — TSH: TSH: 8.75 u[IU]/mL — ABNORMAL HIGH (ref 0.450–4.500)

## 2023-04-09 ENCOUNTER — Other Ambulatory Visit: Payer: Self-pay

## 2023-04-09 DIAGNOSIS — E876 Hypokalemia: Secondary | ICD-10-CM

## 2023-04-09 DIAGNOSIS — E039 Hypothyroidism, unspecified: Secondary | ICD-10-CM

## 2023-04-09 MED ORDER — LEVOTHYROXINE SODIUM 88 MCG PO TABS: 88.0000 ug | ORAL_TABLET | Freq: Every day | ORAL | 1 refills | Status: DC

## 2023-04-09 MED ORDER — POTASSIUM CHLORIDE CRYS ER 10 MEQ PO TBCR: 10.0000 meq | EXTENDED_RELEASE_TABLET | Freq: Every day | ORAL | 0 refills | Status: DC

## 2023-04-19 IMAGING — NM NM HEPATO W/GB/PHARM/[PERSON_NAME]
3 series · 18 of 18 positions shown · non-contrast
Comparison: Ultrasound 11/26/2020

CLINICAL DATA: Right upper quadrant pain

EXAM:
NUCLEAR MEDICINE HEPATOBILIARY IMAGING WITH GALLBLADDER EF
TECHNIQUE: Sequential images of the abdomen were obtained [DATE] minutes
following intravenous administration of radiopharmaceutical. After
oral ingestion of Ensure, gallbladder ejection fraction was
determined. At 60 min, normal ejection fraction is greater than 33%.
RADIOPHARMACEUTICALS:  5.3 mCi Cc-77m  Choletec IV

[he hepatobiliary · 4.52mm/px · 6 of 60 frames shown (1 of 3)]
[frame 6/60]
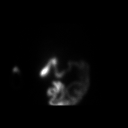
[frame 16/60]
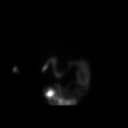
[frame 26/60]
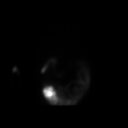
[frame 36/60]
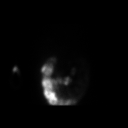
[frame 46/60]
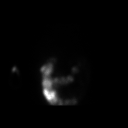
[frame 56/60]
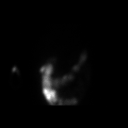

[he hepatobiliary · 4.52mm/px · 6 of 28 frames shown (2 of 3)]
[frame 3/28]
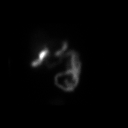
[frame 7/28]
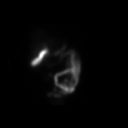
[frame 12/28]
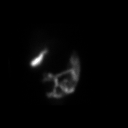
[frame 17/28]
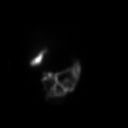
[frame 21/28]
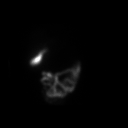
[frame 26/28]
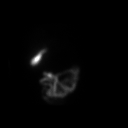

[he hepatobiliary · 4.52mm/px · 6 of 60 frames shown (3 of 3)]
[frame 6/60]
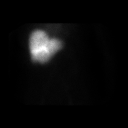
[frame 16/60]
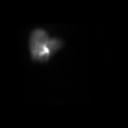
[frame 26/60]
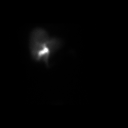
[frame 36/60]
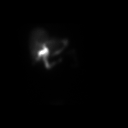
[frame 46/60]
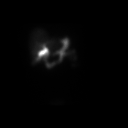
[frame 56/60]
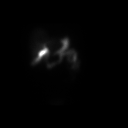

[18 of 18 positions shown; findings below may reference images not displayed]

FINDINGS: Prompt uptake and biliary excretion of activity by the liver is
seen. Gallbladder activity is visualized, consistent with patency of
cystic duct. Biliary activity passes into small bowel, consistent
with patent common bile duct. Moderate gastric reflux of activity.

Calculated gallbladder ejection fraction is 88%. (Normal gallbladder
ejection fraction with Ensure is greater than 33%.)
IMPRESSION: Negative examination

## 2023-07-03 ENCOUNTER — Other Ambulatory Visit: Payer: Medicare Other

## 2023-07-03 DIAGNOSIS — E876 Hypokalemia: Secondary | ICD-10-CM

## 2023-07-03 DIAGNOSIS — E039 Hypothyroidism, unspecified: Secondary | ICD-10-CM

## 2023-07-04 LAB — BMP8+EGFR
BUN/Creatinine Ratio: 16 (ref 12–28)
BUN: 22 mg/dL (ref 8–27)
CO2: 22 mmol/L (ref 20–29)
Calcium: 9.6 mg/dL (ref 8.7–10.3)
Chloride: 104 mmol/L (ref 96–106)
Creatinine, Ser: 1.35 mg/dL — ABNORMAL HIGH (ref 0.57–1.00)
Glucose: 101 mg/dL — ABNORMAL HIGH (ref 70–99)
Potassium: 3.4 mmol/L — ABNORMAL LOW (ref 3.5–5.2)
Sodium: 142 mmol/L (ref 134–144)
eGFR: 41 mL/min/{1.73_m2} — ABNORMAL LOW (ref >59)

## 2023-07-04 LAB — TSH: TSH: 2.6 u[IU]/mL (ref 0.450–4.500)

## 2023-07-10 ENCOUNTER — Other Ambulatory Visit: Payer: Self-pay

## 2023-07-10 ENCOUNTER — Other Ambulatory Visit: Payer: Self-pay | Admitting: Family Medicine

## 2023-07-10 DIAGNOSIS — Z0000 Encounter for general adult medical examination without abnormal findings: Secondary | ICD-10-CM

## 2023-07-10 MED ORDER — IRON (FERROUS SULFATE) 325 (65 FE) MG PO TABS: 325.0000 mg | ORAL_TABLET | Freq: Every day | ORAL | 1 refills | Status: AC

## 2023-07-10 MED ORDER — POTASSIUM CHLORIDE CRYS ER 20 MEQ PO TBCR: 20.0000 meq | EXTENDED_RELEASE_TABLET | Freq: Every day | ORAL | 3 refills | Status: DC

## 2023-07-10 MED ORDER — POTASSIUM CHLORIDE CRYS ER 20 MEQ PO TBCR: 20.0000 meq | EXTENDED_RELEASE_TABLET | Freq: Every day | ORAL | 0 refills | Status: DC

## 2023-07-25 ENCOUNTER — Ambulatory Visit
Admission: RE | Admit: 2023-07-25 | Discharge: 2023-07-25 | Disposition: A | Discharge status: Home / Self Care | Payer: Medicare Other | Source: Ambulatory Visit | Attending: Family Medicine | Admitting: Family Medicine

## 2023-07-25 DIAGNOSIS — Z1231 Encounter for screening mammogram for malignant neoplasm of breast: Secondary | ICD-10-CM | POA: Diagnosis not present

## 2023-07-25 DIAGNOSIS — Z0000 Encounter for general adult medical examination without abnormal findings: Secondary | ICD-10-CM

## 2023-07-30 ENCOUNTER — Other Ambulatory Visit: Payer: Self-pay | Admitting: Family Medicine

## 2023-07-30 DIAGNOSIS — R928 Other abnormal and inconclusive findings on diagnostic imaging of breast: Secondary | ICD-10-CM

## 2023-08-20 ENCOUNTER — Ambulatory Visit
Admission: RE | Admit: 2023-08-20 | Discharge: 2023-08-20 | Disposition: A | Discharge status: Home / Self Care | Payer: Medicare Other | Source: Ambulatory Visit | Attending: Family Medicine

## 2023-08-20 ENCOUNTER — Ambulatory Visit
Admission: RE | Admit: 2023-08-20 | Discharge: 2023-08-20 | Disposition: A | Discharge status: Home / Self Care | Payer: Medicare Other | Source: Ambulatory Visit | Attending: Family Medicine | Admitting: Family Medicine

## 2023-08-20 DIAGNOSIS — N6315 Unspecified lump in the right breast, overlapping quadrants: Secondary | ICD-10-CM | POA: Diagnosis not present

## 2023-08-20 DIAGNOSIS — R928 Other abnormal and inconclusive findings on diagnostic imaging of breast: Secondary | ICD-10-CM

## 2023-09-10 ENCOUNTER — Other Ambulatory Visit: Payer: Self-pay | Admitting: Family Medicine

## 2023-09-13 ENCOUNTER — Other Ambulatory Visit: Payer: Self-pay | Admitting: Family Medicine

## 2023-09-13 ENCOUNTER — Ambulatory Visit: Payer: Medicare Other | Admitting: Family Medicine

## 2023-09-13 ENCOUNTER — Ambulatory Visit (INDEPENDENT_AMBULATORY_CARE_PROVIDER_SITE_OTHER): Payer: Medicare Other

## 2023-09-13 ENCOUNTER — Encounter: Payer: Self-pay | Admitting: Family Medicine

## 2023-09-13 VITALS — BP 121/58 | HR 80 | Temp 96.8°F | Ht 64.0 in | Wt 158.8 lb

## 2023-09-13 DIAGNOSIS — I10 Essential (primary) hypertension: Secondary | ICD-10-CM

## 2023-09-13 DIAGNOSIS — Z0000 Encounter for general adult medical examination without abnormal findings: Secondary | ICD-10-CM

## 2023-09-13 DIAGNOSIS — Z13820 Encounter for screening for osteoporosis: Secondary | ICD-10-CM | POA: Diagnosis not present

## 2023-09-13 DIAGNOSIS — E039 Hypothyroidism, unspecified: Secondary | ICD-10-CM

## 2023-09-13 DIAGNOSIS — F411 Generalized anxiety disorder: Secondary | ICD-10-CM | POA: Diagnosis not present

## 2023-09-13 DIAGNOSIS — E782 Mixed hyperlipidemia: Secondary | ICD-10-CM | POA: Diagnosis not present

## 2023-09-13 DIAGNOSIS — Z780 Asymptomatic menopausal state: Secondary | ICD-10-CM | POA: Diagnosis not present

## 2023-09-13 DIAGNOSIS — Z79891 Long term (current) use of opiate analgesic: Secondary | ICD-10-CM | POA: Diagnosis not present

## 2023-09-13 DIAGNOSIS — Z0001 Encounter for general adult medical examination with abnormal findings: Secondary | ICD-10-CM

## 2023-09-13 MED ORDER — ROSUVASTATIN CALCIUM 10 MG PO TABS: 10.0000 mg | ORAL_TABLET | Freq: Every day | ORAL | 3 refills | Status: DC

## 2023-09-13 MED ORDER — ALPRAZOLAM 0.25 MG PO TABS: 0.2500 mg | ORAL_TABLET | Freq: Every evening | ORAL | 5 refills | Status: DC | PRN

## 2023-09-13 MED ORDER — LOSARTAN POTASSIUM 25 MG PO TABS: 12.5000 mg | ORAL_TABLET | Freq: Every day | ORAL | 3 refills | Status: DC

## 2023-09-13 MED ORDER — PANTOPRAZOLE SODIUM 40 MG PO TBEC: 40.0000 mg | DELAYED_RELEASE_TABLET | Freq: Two times a day (BID) | ORAL | 3 refills | Status: DC

## 2023-09-13 MED ORDER — LEVOTHYROXINE SODIUM 88 MCG PO TABS: 88.0000 ug | ORAL_TABLET | Freq: Every day | ORAL | 3 refills | Status: DC

## 2023-09-13 MED ORDER — HYDROCHLOROTHIAZIDE 12.5 MG PO CAPS: 12.5000 mg | ORAL_CAPSULE | Freq: Every day | ORAL | 3 refills | Status: DC

## 2023-09-13 MED ORDER — POTASSIUM CHLORIDE CRYS ER 20 MEQ PO TBCR: 20.0000 meq | EXTENDED_RELEASE_TABLET | Freq: Every day | ORAL | 1 refills | Status: DC

## 2023-09-13 MED ORDER — BUPROPION HCL ER (XL) 150 MG PO TB24: 150.0000 mg | ORAL_TABLET | Freq: Every day | ORAL | 3 refills | Status: DC

## 2023-09-13 MED ORDER — ESCITALOPRAM OXALATE 20 MG PO TABS: 20.0000 mg | ORAL_TABLET | Freq: Every day | ORAL | 3 refills | Status: DC

## 2023-09-13 NOTE — Progress Notes (Signed)
 BP (!) 121/58   Pulse 80   Temp (!) 96.8 F (36 C)   Ht 5' 4 (1.626 m)   Wt 158 lb 12.8 oz (72 kg)   SpO2 98%   BMI 27.26 kg/m    Subjective:   Patient ID: Isabella Lindsey, female    DOB: May 01, 1946, 78 y.o.   MRN: [id number]  HPI: Isabella Lindsey is a 78 y.o. female presenting on 09/13/2023 for Medical Management of Chronic Issues (Cpe ) and Hypothyroidism  Physical Exam Patient presents for her annual exam. She denies chest pain, shortness of breath, headaches, lightheadedness, myalgias, dysuria, or hematuria.  Hypertension and CKD Patient is currently taking losartan  and hydrochlorothiazide . Her blood pressure today is 121/58. She denies lightheadedness, headaches, vision changes, chest pain, or shortness of breath.    Hypothyroidism recheck Patient is currently taking levothyroxine  88 mcg. She denies changes in skin, heat or cold intolerance, diarrhea, and chest pain or palpitations. She reports hair thinning, fatigue, and constipation, but she is unsure whether this is related to her thyroid  or anemia.  Hyperlipidemia Patient is currently taking rosuvastatin . She denies myalgias or weakness. She does not have a history of liver damage from it.   Anxiety and Depression Patient is coming in for an anxiety and depression recheck. She is currently taking bupropion , escitalopram , and alprazolam . She reports the medications are helping manage her symptoms, but she has experienced stress related to the health of her aunt and mother.  Current rx-alprazolam  0.25 mg nightly as needed # meds rx-30/month Effectiveness of current meds-works well Adverse reactions form meds-none   Pill count performed-No Last drug screen -09/13/2023 ( high risk q62m, moderate risk q86m, low risk yearly ) Urine drug screen today- No Was the NCCSR reviewed- yes             If yes were their any concerning findings? - no    09/13/2023    1:08 PM 04/04/2023    9:52 AM 11/01/2022   12:29 PM 09/27/2022     8:54 AM  GAD 7 : Generalized Anxiety Score  Nervous, Anxious, on Edge 1 1 1 1   Control/stop worrying 2 2 1 1   Worry too much - different things 1 1 1 1   Trouble relaxing 2 2 1 1   Restless 0 0 0 0  Easily annoyed or irritable 0 0 0 0  Afraid - awful might happen 1 1 0 0  Total GAD 7 Score 7 7 4 4   Anxiety Difficulty Somewhat difficult Somewhat difficult Not difficult at all Not difficult at all   Anemia Patient is currently taking ferrous sulfate  325 mg daily. She has experienced some constipation, but it resolves with Senokot. She also reports thinning hair and fatigue.  Relevant past medical, surgical, family and social history reviewed and updated as indicated. Interim medical history since our last visit reviewed. Allergies and medications reviewed and updated.  Review of Systems  Constitutional:  Positive for fatigue. Negative for chills and fever.  HENT:  Positive for sore throat (secondary to hiatal hernia/GERD). Negative for congestion and trouble swallowing.   Eyes:  Negative for visual disturbance.  Respiratory:  Negative for cough, chest tightness and shortness of breath.   Cardiovascular:  Negative for chest pain and leg swelling.  Gastrointestinal:  Positive for abdominal pain (occasional RUQ pain at night) and constipation (with iron  use, resolves with Senakot). Negative for blood in stool and diarrhea.  Endocrine: Negative for cold intolerance.  Thinning hair  Genitourinary:  Negative for difficulty urinating, dysuria and hematuria.  Musculoskeletal:  Positive for arthralgias. Negative for myalgias.  Skin:  Negative for rash.  Neurological:  Positive for dizziness (history of vertigo). Negative for syncope, weakness, light-headedness and headaches.  Psychiatric/Behavioral:  Positive for dysphoric mood. Negative for self-injury, sleep disturbance and suicidal ideas. The patient is nervous/anxious.     Per HPI unless specifically indicated above   Allergies as of  09/13/2023       Reactions   Effexor [venlafaxine]    Dizziness   Penicillins Itching, Swelling   Erythromycin Rash        Medication List        Accurate as of September 13, 2023  2:01 PM. If you have any questions, ask your nurse or doctor.          ALPRAZolam  0.25 MG tablet Commonly known as: XANAX  Take 1 tablet (0.25 mg total) by mouth at bedtime as needed for anxiety.   buPROPion  150 MG 24 hr tablet Commonly known as: WELLBUTRIN  XL Take 1 tablet (150 mg total) by mouth daily.   cholecalciferol 25 MCG (1000 UNIT) tablet Commonly known as: VITAMIN D3 Take 2,000 Units by mouth daily.   escitalopram  20 MG tablet Commonly known as: LEXAPRO  Take 1 tablet (20 mg total) by mouth daily.   fluticasone  50 MCG/ACT nasal spray Commonly known as: FLONASE  Place 2 sprays into both nostrils daily.   hydrochlorothiazide  12.5 MG capsule Commonly known as: MICROZIDE  Take 1 capsule (12.5 mg total) by mouth daily.   Iron  (Ferrous Sulfate ) 325 (65 Fe) MG Tabs Take 325 mg by mouth daily.   levothyroxine  88 MCG tablet Commonly known as: SYNTHROID  Take 1 tablet (88 mcg total) by mouth daily.   losartan  25 MG tablet Commonly known as: COZAAR  Take 0.5 tablets (12.5 mg total) by mouth daily.   pantoprazole  40 MG tablet Commonly known as: PROTONIX  Take 1 tablet (40 mg total) by mouth 2 (two) times daily before a meal.   potassium chloride  SA 20 MEQ tablet Commonly known as: KLOR-CON  M Take 1 tablet (20 mEq total) by mouth daily.   rosuvastatin  10 MG tablet Commonly known as: CRESTOR  Take 1 tablet (10 mg total) by mouth daily.         Objective:   BP (!) 121/58   Pulse 80   Temp (!) 96.8 F (36 C)   Ht 5' 4 (1.626 m)   Wt 158 lb 12.8 oz (72 kg)   SpO2 98%   BMI 27.26 kg/m   Wt Readings from Last 3 Encounters:  09/13/23 158 lb 12.8 oz (72 kg)  04/04/23 152 lb (68.9 kg)  02/16/23 150 lb (68 kg)    Physical Exam Vitals and nursing note reviewed.   Constitutional:      General: She is not in acute distress.    Appearance: Normal appearance.  HENT:     Head: Normocephalic and atraumatic.     Right Ear: Tympanic membrane, ear canal and external ear normal.     Left Ear: Tympanic membrane, ear canal and external ear normal.     Nose: Nose normal.     Mouth/Throat:     Mouth: Mucous membranes are moist.     Pharynx: Oropharynx is clear. No oropharyngeal exudate.  Eyes:     Conjunctiva/sclera: Conjunctivae normal.  Cardiovascular:     Rate and Rhythm: Normal rate and regular rhythm.     Heart sounds: Normal heart sounds.  Pulmonary:  Effort: Pulmonary effort is normal.     Breath sounds: Normal breath sounds. No wheezing or rales.  Abdominal:     General: Abdomen is flat. There is no distension.     Palpations: Abdomen is soft.     Tenderness: There is no abdominal tenderness. There is no right CVA tenderness or left CVA tenderness.  Musculoskeletal:     Cervical back: Normal range of motion and neck supple. No tenderness.     Right lower leg: No edema.     Left lower leg: No edema.  Skin:    General: Skin is warm and dry.  Neurological:     Mental Status: She is alert and oriented to person, place, and time.  Psychiatric:        Attention and Perception: Attention and perception normal.        Mood and Affect: Affect normal. Mood is anxious.        Speech: Speech normal.        Behavior: Behavior normal. Behavior is cooperative.        Thought Content: Thought content normal.        Judgment: Judgment normal.     Assessment & Plan:   Problem List Items Addressed This Visit       Cardiovascular and Mediastinum   Hypertension   Relevant Medications   losartan  (COZAAR ) 25 MG tablet   rosuvastatin  (CRESTOR ) 10 MG tablet   hydrochlorothiazide  (MICROZIDE ) 12.5 MG capsule   Other Relevant Orders   CMP14+EGFR (Completed)     Endocrine   Hypothyroidism   Relevant Medications   levothyroxine  (SYNTHROID ) 88 MCG  tablet   Other Relevant Orders   TSH (Completed)     Other   Hyperlipidemia   Relevant Medications   losartan  (COZAAR ) 25 MG tablet   rosuvastatin  (CRESTOR ) 10 MG tablet   hydrochlorothiazide  (MICROZIDE ) 12.5 MG capsule   Other Relevant Orders   Lipid panel (Completed)   GAD (generalized anxiety disorder)   Relevant Medications   ALPRAZolam  (XANAX ) 0.25 MG tablet   escitalopram  (LEXAPRO ) 20 MG tablet   buPROPion  (WELLBUTRIN  XL) 150 MG 24 hr tablet   Other Relevant Orders   CBC with Differential/Platelet (Completed)   ToxASSURE Select 13 (MW), Urine   Other Visit Diagnoses       Physical exam    -  Primary   Relevant Orders   CBC with Differential/Platelet (Completed)   CMP14+EGFR (Completed)   Lipid panel (Completed)   TSH (Completed)     Osteoporosis screening       Relevant Orders   DG [facility] DEXA (Completed)       Patient is doing pretty well. Blood pressure is well-controlled at 121/58 today. Patient is tolerating rosuvastatin  well. Will recheck lipid panel today. Patient reports thinning hair, fatigue, and constipation. This could be related to her thyroid  or anemia, so will recheck CBC and TSH today. Her anxiety is managed with the current medication regimen. Will not make any medication changes at this time.  Follow up plan: Return in about 6 months (around 03/12/2024), or if symptoms worsen or fail to improve, for Hypertension and thyroid  and cholesterol.  Counseling provided for all of the vaccine components Orders Placed This Encounter  Procedures   DG [facility] DEXA   CBC with Differential/Platelet   CMP14+EGFR   Lipid panel   TSH   ToxASSURE Select 13 (MW), Urine    Vernell Music, Medical Student [facility] 09/13/2023, 2:01 PM  I was  personally present for all components of the history, physical exam and/or medical decision making.  I agree with the documentation performed by the student and agree with assessment and plan above.   Student was Vernell Music, medical student. Fonda Levins, MD [facility] 09/14/2023, 8:08 AM

## 2023-09-14 LAB — CMP14+EGFR
ALT: 14 [IU]/L (ref 0–32)
AST: 20 [IU]/L (ref 0–40)
Albumin: 4.1 g/dL (ref 3.8–4.8)
Alkaline Phosphatase: 79 [IU]/L (ref 44–121)
BUN/Creatinine Ratio: 16 (ref 12–28)
BUN: 23 mg/dL (ref 8–27)
Bilirubin Total: 0.4 mg/dL (ref 0.0–1.2)
CO2: 23 mmol/L (ref 20–29)
Calcium: 9.9 mg/dL (ref 8.7–10.3)
Chloride: 106 mmol/L (ref 96–106)
Creatinine, Ser: 1.45 mg/dL — ABNORMAL HIGH (ref 0.57–1.00)
Globulin, Total: 2.6 g/dL (ref 1.5–4.5)
Glucose: 97 mg/dL (ref 70–99)
Potassium: 4 mmol/L (ref 3.5–5.2)
Sodium: 141 mmol/L (ref 134–144)
Total Protein: 6.7 g/dL (ref 6.0–8.5)
eGFR: 37 mL/min/{1.73_m2} — ABNORMAL LOW (ref >59)

## 2023-09-14 LAB — CBC WITH DIFFERENTIAL/PLATELET
Basophils Absolute: 0.1 10*3/uL (ref 0.0–0.2)
Basos: 1 %
EOS (ABSOLUTE): 0.1 10*3/uL (ref 0.0–0.4)
Eos: 2 %
Hematocrit: 40.8 % (ref 34.0–46.6)
Hemoglobin: 13.4 g/dL (ref 11.1–15.9)
Immature Grans (Abs): 0 10*3/uL (ref 0.0–0.1)
Immature Granulocytes: 0 %
Lymphocytes Absolute: 1.8 10*3/uL (ref 0.7–3.1)
Lymphs: 24 %
MCH: 29.8 pg (ref 26.6–33.0)
MCHC: 32.8 g/dL (ref 31.5–35.7)
MCV: 91 fL (ref 79–97)
Monocytes Absolute: 0.5 10*3/uL (ref 0.1–0.9)
Monocytes: 6 %
Neutrophils Absolute: 4.9 10*3/uL (ref 1.4–7.0)
Neutrophils: 67 %
Platelets: 280 10*3/uL (ref 150–450)
RBC: 4.5 x10E6/uL (ref 3.77–5.28)
RDW: 12.3 % (ref 11.7–15.4)
WBC: 7.3 10*3/uL (ref 3.4–10.8)

## 2023-09-14 LAB — LIPID PANEL
Chol/HDL Ratio: 2.4 {ratio} (ref 0.0–4.4)
Cholesterol, Total: 152 mg/dL (ref 100–199)
HDL: 63 mg/dL (ref >39)
LDL Chol Calc (NIH): 67 mg/dL (ref 0–99)
Triglycerides: 124 mg/dL (ref 0–149)
VLDL Cholesterol Cal: 22 mg/dL (ref 5–40)

## 2023-09-14 LAB — TSH: TSH: 1.58 u[IU]/mL (ref 0.450–4.500)

## 2023-09-16 LAB — TOXASSURE SELECT 13 (MW), URINE

## 2023-09-17 ENCOUNTER — Telehealth: Payer: Self-pay | Admitting: Family Medicine

## 2023-09-17 NOTE — Telephone Encounter (Signed)
 Copied from CRM [id number]. Topic: Clinical - Request for Lab/Test Order >> Sep 17, 2023 12:08 PM Ivette P wrote: Reason for CRM: Pt did not have thyroid bloodwork checked on last lab work order and would like thyroid checked.

## 2023-09-17 NOTE — Telephone Encounter (Signed)
 Pt made aware that tsh was checked and we would call her to let her know the results of her other labs. Pt understood and has no further concerns.

## 2023-09-24 NOTE — Addendum Note (Signed)
Addended by: Arville Care on: 09/24/2023 07:36 AM   Modules accepted: Level of Service

## 2023-10-10 ENCOUNTER — Ambulatory Visit: Payer: Medicare Other | Admitting: Family Medicine

## 2023-12-25 ENCOUNTER — Other Ambulatory Visit: Payer: Medicare Other

## 2024-03-12 ENCOUNTER — Encounter: Payer: Self-pay | Admitting: Family Medicine

## 2024-03-12 ENCOUNTER — Ambulatory Visit (INDEPENDENT_AMBULATORY_CARE_PROVIDER_SITE_OTHER): Payer: Medicare Other | Admitting: Family Medicine

## 2024-03-12 VITALS — BP 135/69 | HR 83 | Ht 64.0 in | Wt 152.0 lb

## 2024-03-12 DIAGNOSIS — I10 Essential (primary) hypertension: Secondary | ICD-10-CM | POA: Diagnosis not present

## 2024-03-12 DIAGNOSIS — E039 Hypothyroidism, unspecified: Secondary | ICD-10-CM

## 2024-03-12 DIAGNOSIS — F411 Generalized anxiety disorder: Secondary | ICD-10-CM

## 2024-03-12 DIAGNOSIS — R102 Pelvic and perineal pain: Secondary | ICD-10-CM

## 2024-03-12 DIAGNOSIS — R8290 Unspecified abnormal findings in urine: Secondary | ICD-10-CM | POA: Diagnosis not present

## 2024-03-12 DIAGNOSIS — E782 Mixed hyperlipidemia: Secondary | ICD-10-CM | POA: Diagnosis not present

## 2024-03-12 LAB — URINALYSIS
Bilirubin, UA: NEGATIVE
Glucose, UA: NEGATIVE
Ketones, UA: NEGATIVE
Nitrite, UA: NEGATIVE
Protein,UA: NEGATIVE
Specific Gravity, UA: 1.015 (ref 1.005–1.030)
Urobilinogen, Ur: 0.2 mg/dL (ref 0.2–1.0)
pH, UA: 7 (ref 5.0–7.5)

## 2024-03-12 NOTE — Progress Notes (Signed)
 BP 135/69   Pulse 83   Ht 5' 4 (1.626 m)   Wt 152 lb (68.9 kg)   SpO2 98%   BMI 26.09 kg/m    Subjective:   Patient ID: Isabella Lindsey, female    DOB: [birth year], 78 y.o.   MRN: [id number]  HPI: Isabella Lindsey is a 78 y.o. female presenting on 03/12/2024 for Medical Management of Chronic Issues and Hypothyroidism   HPI Hypothyroidism recheck Patient is coming in for thyroid  recheck today as well. They deny any issues with hair changes or heat or cold problems or diarrhea or constipation. They deny any chest pain or palpitations. They are currently on levothyroxine  88 micrograms.  She says she has been having a little more fatigue recently and thinks that her thyroid  might be acting up  Hypertension Patient is currently on losartan  and hydrochlorothiazide , and their blood pressure today is 135/69. Patient denies any lightheadedness or dizziness. Patient denies headaches, blurred vision, chest pains, shortness of breath, or weakness. Denies any side effects from medication and is content with current medication.   Hyperlipidemia Patient is coming in for recheck of his hyperlipidemia. The patient is currently taking Crestor . They deny any issues with myalgias or history of liver damage from it. They deny any focal numbness or weakness or chest pain.   Anxiety Current rx-alprazolam  0.25 nightly as needed # meds rx-30 Effectiveness of current meds-works well Adverse reactions form meds-none Pill count performed-No Last drug screen -09/20/2023 ( high risk q57m, moderate risk q25m, low risk yearly ) Urine drug screen today- No Was the NCCSR reviewed-yes  If yes were their any concerning findings? -None Controlled substance contract signed on: 09/20/2023  Relevant past medical, surgical, family and social history reviewed and updated as indicated. Interim medical history since our last visit reviewed. Allergies and medications reviewed and updated.  Review of Systems   Constitutional:  Negative for chills and fever.  HENT:  Negative for congestion, ear discharge and ear pain.   Eyes:  Negative for visual disturbance.  Respiratory:  Negative for chest tightness and shortness of breath.   Cardiovascular:  Negative for chest pain and leg swelling.  Genitourinary:  Positive for dysuria. Negative for difficulty urinating, frequency and hematuria.  Musculoskeletal:  Negative for back pain and gait problem.  Skin:  Negative for rash.  Neurological:  Negative for dizziness, light-headedness and headaches.  Psychiatric/Behavioral:  Negative for agitation and behavioral problems.   All other systems reviewed and are negative.   Per HPI unless specifically indicated above   Allergies as of 03/12/2024       Reactions   Effexor [venlafaxine]    Dizziness   Penicillins Itching, Swelling   Erythromycin Rash        Medication List        Accurate as of March 12, 2024  1:33 PM. If you have any questions, ask your nurse or doctor.          ALPRAZolam  0.25 MG tablet Commonly known as: XANAX  Take 1 tablet (0.25 mg total) by mouth at bedtime as needed for anxiety.   buPROPion  150 MG 24 hr tablet Commonly known as: WELLBUTRIN  XL Take 1 tablet (150 mg total) by mouth daily.   cholecalciferol 25 MCG (1000 UNIT) tablet Commonly known as: VITAMIN D3 Take 2,000 Units by mouth daily.   escitalopram  20 MG tablet Commonly known as: LEXAPRO  Take 1 tablet (20 mg total) by mouth daily.   fluticasone  50 MCG/ACT nasal spray Commonly  known as: FLONASE  Place 2 sprays into both nostrils daily.   hydrochlorothiazide  12.5 MG capsule Commonly known as: MICROZIDE  Take 1 capsule (12.5 mg total) by mouth daily.   Iron  (Ferrous Sulfate ) 325 (65 Fe) MG Tabs Take 325 mg by mouth daily.   levothyroxine  88 MCG tablet Commonly known as: SYNTHROID  Take 1 tablet (88 mcg total) by mouth daily.   losartan  25 MG tablet Commonly known as: COZAAR  Take 0.5 tablets (12.5  mg total) by mouth daily.   pantoprazole  40 MG tablet Commonly known as: PROTONIX  Take 1 tablet (40 mg total) by mouth 2 (two) times daily before a meal.   potassium chloride  SA 20 MEQ tablet Commonly known as: KLOR-CON  M Take 1 tablet (20 mEq total) by mouth daily.   rosuvastatin  10 MG tablet Commonly known as: CRESTOR  Take 1 tablet (10 mg total) by mouth daily.         Objective:   BP 135/69   Pulse 83   Ht 5' 4 (1.626 m)   Wt 152 lb (68.9 kg)   SpO2 98%   BMI 26.09 kg/m   Wt Readings from Last 3 Encounters:  03/12/24 152 lb (68.9 kg)  09/13/23 158 lb 12.8 oz (72 kg)  04/04/23 152 lb (68.9 kg)    Physical Exam Vitals and nursing note reviewed.  Constitutional:      General: She is not in acute distress.    Appearance: She is well-developed. She is not diaphoretic.  Eyes:     Conjunctiva/sclera: Conjunctivae normal.  Cardiovascular:     Rate and Rhythm: Normal rate and regular rhythm.     Heart sounds: Normal heart sounds. No murmur heard. Pulmonary:     Effort: Pulmonary effort is normal. No respiratory distress.     Breath sounds: Normal breath sounds. No wheezing.  Musculoskeletal:        General: No swelling or deformity.  Skin:    General: Skin is warm and dry.     Findings: No rash.  Neurological:     Mental Status: She is alert and oriented to person, place, and time.     Coordination: Coordination normal.  Psychiatric:        Behavior: Behavior normal.    Urinalysis: 2+ leukocytes and trace blood, otherwise negative.   Assessment & Plan:   Problem List Items Addressed This Visit       Cardiovascular and Mediastinum   Hypertension     Endocrine   Hypothyroidism   Relevant Orders   CBC with Differential/Platelet   CMP14+EGFR   Lipid panel   TSH     Other   Hyperlipidemia   Relevant Orders   CBC with Differential/Platelet   CMP14+EGFR   Lipid panel   TSH   GAD (generalized anxiety disorder)   Relevant Orders   CBC with  Differential/Platelet   CMP14+EGFR   Lipid panel   TSH   Other Visit Diagnoses       Pelvic pain    -  Primary   Relevant Orders   Urinalysis   Urine Culture     Cloudy urine       Relevant Orders   Urinalysis   Urine Culture     Seems to be doing well, urine did not show anything major, will await culture.  Will do blood work today as well.  Follow up plan: Return in about 6 months (around 09/12/2024), or if symptoms worsen or fail to improve, for Thyroid  and hypertension and cholesterol recheck.  Counseling provided for all of the vaccine components Orders Placed This Encounter  Procedures   Urine Culture   Urinalysis   CBC with Differential/Platelet   CMP14+EGFR   Lipid panel   TSH    Fonda Levins, MD [facility] 03/12/2024, 1:33 PM

## 2024-03-13 LAB — CMP14+EGFR
ALT: 13 IU/L (ref 0–32)
AST: 19 IU/L (ref 0–40)
Albumin: 4.2 g/dL (ref 3.8–4.8)
Alkaline Phosphatase: 70 IU/L (ref 44–121)
BUN/Creatinine Ratio: 23 (ref 12–28)
BUN: 30 mg/dL — ABNORMAL HIGH (ref 8–27)
Bilirubin Total: 0.4 mg/dL (ref 0.0–1.2)
CO2: 21 mmol/L (ref 20–29)
Calcium: 9.8 mg/dL (ref 8.7–10.3)
Chloride: 101 mmol/L (ref 96–106)
Creatinine, Ser: 1.28 mg/dL — ABNORMAL HIGH (ref 0.57–1.00)
Globulin, Total: 2.6 g/dL (ref 1.5–4.5)
Glucose: 102 mg/dL — ABNORMAL HIGH (ref 70–99)
Potassium: 3.8 mmol/L (ref 3.5–5.2)
Sodium: 140 mmol/L (ref 134–144)
Total Protein: 6.8 g/dL (ref 6.0–8.5)
eGFR: 43 mL/min/1.73 — ABNORMAL LOW (ref >59)

## 2024-03-13 LAB — CBC WITH DIFFERENTIAL/PLATELET
Basophils Absolute: 0 x10E3/uL (ref 0.0–0.2)
Basos: 1 %
EOS (ABSOLUTE): 0.1 x10E3/uL (ref 0.0–0.4)
Eos: 1 %
Hematocrit: 41.5 % (ref 34.0–46.6)
Hemoglobin: 13.4 g/dL (ref 11.1–15.9)
Immature Grans (Abs): 0 x10E3/uL (ref 0.0–0.1)
Immature Granulocytes: 0 %
Lymphocytes Absolute: 1.9 x10E3/uL (ref 0.7–3.1)
Lymphs: 22 %
MCH: 30 pg (ref 26.6–33.0)
MCHC: 32.3 g/dL (ref 31.5–35.7)
MCV: 93 fL (ref 79–97)
Monocytes Absolute: 0.6 x10E3/uL (ref 0.1–0.9)
Monocytes: 7 %
Neutrophils Absolute: 5.8 x10E3/uL (ref 1.4–7.0)
Neutrophils: 69 %
Platelets: 264 x10E3/uL (ref 150–450)
RBC: 4.46 x10E6/uL (ref 3.77–5.28)
RDW: 12 % (ref 11.7–15.4)
WBC: 8.5 x10E3/uL (ref 3.4–10.8)

## 2024-03-13 LAB — TSH: TSH: 0.543 u[IU]/mL (ref 0.450–4.500)

## 2024-03-13 LAB — LIPID PANEL
Chol/HDL Ratio: 2.2 ratio (ref 0.0–4.4)
Cholesterol, Total: 146 mg/dL (ref 100–199)
HDL: 65 mg/dL (ref >39)
LDL Chol Calc (NIH): 66 mg/dL (ref 0–99)
Triglycerides: 81 mg/dL (ref 0–149)
VLDL Cholesterol Cal: 15 mg/dL (ref 5–40)

## 2024-03-16 LAB — URINE CULTURE

## 2024-03-17 ENCOUNTER — Ambulatory Visit: Payer: Self-pay | Admitting: Family Medicine

## 2024-03-17 MED ORDER — NITROFURANTOIN MONOHYD MACRO 100 MG PO CAPS: 100.0000 mg | ORAL_CAPSULE | Freq: Two times a day (BID) | ORAL | 0 refills | Status: AC

## 2024-03-30 ENCOUNTER — Other Ambulatory Visit: Payer: Self-pay | Admitting: Family Medicine

## 2024-03-30 DIAGNOSIS — F411 Generalized anxiety disorder: Secondary | ICD-10-CM

## 2024-03-31 ENCOUNTER — Other Ambulatory Visit: Payer: Self-pay | Admitting: Family Medicine

## 2024-03-31 DIAGNOSIS — F411 Generalized anxiety disorder: Secondary | ICD-10-CM

## 2024-03-31 MED ORDER — ALPRAZOLAM 0.25 MG PO TABS: 0.2500 mg | ORAL_TABLET | Freq: Every evening | ORAL | 5 refills | Status: DC | PRN

## 2024-03-31 NOTE — Progress Notes (Signed)
 Sent refill

## 2024-04-02 NOTE — Telephone Encounter (Unsigned)
 Copied from CRM [id number]. Topic: Clinical - Medication Refill >> Apr 02, 2024 11:55 AM Harlene ORN wrote: Medication: ALPRAZolam  (XANAX ) 0.25 MG tablet  Has the patient contacted their pharmacy? Yes (Agent: If no, request that the patient contact the pharmacy for the refill. If patient does not wish to contact the pharmacy document the reason why and proceed with request.) (Agent: If yes, when and what did the pharmacy advise?)  This is the patient's preferred pharmacy:  [facility] [address] [place] - [address] [place] [address] Phone: [phone] Fax: [phone]  Is this the correct pharmacy for this prescription? Yes If no, delete pharmacy and type the correct one.   Has the prescription been filled recently? No  Is the patient out of the medication? Yes  Has the patient been seen for an appointment in the last year OR does the patient have an upcoming appointment? Yes  Can we respond through MyChart? No  Agent: Please be advised that Rx refills may take up to 3 business days. We ask that you follow-up with your pharmacy.

## 2024-04-25 ENCOUNTER — Ambulatory Visit (INDEPENDENT_AMBULATORY_CARE_PROVIDER_SITE_OTHER)

## 2024-04-25 VITALS — Ht 64.0 in | Wt 152.0 lb

## 2024-04-25 DIAGNOSIS — Z0000 Encounter for general adult medical examination without abnormal findings: Secondary | ICD-10-CM | POA: Diagnosis not present

## 2024-04-25 DIAGNOSIS — Z1231 Encounter for screening mammogram for malignant neoplasm of breast: Secondary | ICD-10-CM

## 2024-04-25 NOTE — Progress Notes (Signed)
 Subjective:   Isabella Lindsey is a 78 y.o. who presents for a Medicare Wellness preventive visit.  As a reminder, Annual Wellness Visits don't include a physical exam, and some assessments may be limited, especially if this visit is performed virtually. We may recommend an in-person follow-up visit with your provider if needed.  Visit Complete: Virtual I connected with  Isabella Lindsey on 04/25/24 by a audio enabled telemedicine application and verified that I am speaking with the correct person using two identifiers.  Patient Location: Home  Provider Location: Home Office  I discussed the limitations of evaluation and management by telemedicine. The patient expressed understanding and agreed to proceed.  Vital Signs: Because this visit was a virtual/telehealth visit, some criteria may be missing or patient reported. Any vitals not documented were not able to be obtained and vitals that have been documented are patient reported.  VideoDeclined- This patient declined Librarian, academic. Therefore the visit was completed with audio only.  Persons Participating in Visit: Patient.  AWV Questionnaire: No: Patient Medicare AWV questionnaire was not completed prior to this visit.  Cardiac Risk Factors include: advanced age (>89men, >68 women);dyslipidemia;hypertension     Objective:    Today's Vitals   04/25/24 1249  Weight: 152 lb (68.9 kg)  Height: 5' 4 (1.626 m)   Body mass index is 26.09 kg/m.     04/25/2024    1:07 PM 04/25/2024   12:49 PM 02/16/2023    8:48 AM 02/14/2022   10:58 AM 01/03/2022   10:20 AM 02/11/2021   10:46 AM 02/11/2020   10:23 AM  Advanced Directives  Does Patient Have a Medical Advance Directive? Yes No Yes Yes Yes Yes Yes  Type of Estate agent of [place];Living will  Healthcare Power of [place];Living will Healthcare Power of [place];Living will Healthcare Power of [place];Living will Healthcare Power  of [place];Living will Healthcare Power of [place];Living will  Does patient want to make changes to medical advance directive?       No - Patient declined  Copy of Healthcare Power of Attorney in Chart? No - copy requested  No - copy requested No - copy requested  No - copy requested   Would patient like information on creating a medical advance directive? No - Patient declined No - Patient declined         Current Medications (verified) Outpatient Encounter Medications as of 04/25/2024  Medication Sig   ALPRAZolam  (XANAX ) 0.25 MG tablet Take 1 tablet (0.25 mg total) by mouth at bedtime as needed for anxiety.   buPROPion  (WELLBUTRIN  XL) 150 MG 24 hr tablet Take 1 tablet (150 mg total) by mouth daily.   cholecalciferol (VITAMIN D3) 25 MCG (1000 UNIT) tablet Take 2,000 Units by mouth daily.   escitalopram  (LEXAPRO ) 20 MG tablet Take 1 tablet (20 mg total) by mouth daily.   fluticasone  (FLONASE ) 50 MCG/ACT nasal spray Place 2 sprays into both nostrils daily.   hydrochlorothiazide  (MICROZIDE ) 12.5 MG capsule Take 1 capsule (12.5 mg total) by mouth daily.   Iron , Ferrous Sulfate , 325 (65 Fe) MG TABS Take 325 mg by mouth daily.   levothyroxine  (SYNTHROID ) 88 MCG tablet Take 1 tablet (88 mcg total) by mouth daily.   losartan  (COZAAR ) 25 MG tablet Take 0.5 tablets (12.5 mg total) by mouth daily.   nitrofurantoin , macrocrystal-monohydrate, (MACROBID ) 100 MG capsule Take 1 capsule (100 mg total) by mouth 2 (two) times daily. 1 po BId   pantoprazole  (PROTONIX ) 40 MG  tablet Take 1 tablet (40 mg total) by mouth 2 (two) times daily before a meal.   potassium chloride  SA (KLOR-CON  M) 20 MEQ tablet Take 1 tablet (20 mEq total) by mouth daily.   rosuvastatin  (CRESTOR ) 10 MG tablet Take 1 tablet (10 mg total) by mouth daily.   No facility-administered encounter medications on file as of 04/25/2024.    Allergies (verified) Effexor [venlafaxine], Penicillins, and Erythromycin   History: Past Medical  History:  Diagnosis Date   Allergic rhinitis    Allergy    Anemia    Anxiety    Blood transfusion without reported diagnosis    Cataract    Depression    DJD (degenerative joint disease)    cervical and Rt. knee, Rt. 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   History of kidney stones    Hypertension    Hypothyroidism    Insomnia    Occassional   Migraine    Seborrheic keratoses    Scattered seborrheic keratoses   Past Surgical History:  Procedure Laterality Date   ABDOMINAL HYSTERECTOMY  1986   bilateral cataract surgery      CESAREAN SECTION     EYE SURGERY     JOINT REPLACEMENT     right knee replacement      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   Procedure: TOTAL KNEE ARTHROPLASTY;  Surgeon: Edna Toribio LABOR, MD;  Location: [facility];  Service: Orthopedics;  Laterality: Left;   Family History  Problem Relation Age of Onset   Hearing loss Mother    Cancer Father        lung   Arthritis Sister    Kidney disease Sister    Alcohol  abuse Son    COPD Son    Early death Daughter    Breast cancer Neg Hx    Social History   Socioeconomic History   Marital status: Married    Spouse name: Miquel   Number of children: 3   Years of education: Not on file   Highest education level: Some college, no degree  Occupational History   Occupation: Retired  Tobacco Use   Smoking status: Never   Smokeless tobacco: Never  Vaping Use   Vaping status: Never Used  Substance and Sexual Activity   Alcohol  use: Never   Drug use: Never   Sexual activity: Not Currently  Other Topics Concern   Not on file  Social History Narrative   Lives at home with husband. One son lives in [place].   Social Drivers of Corporate investment banker Strain: Low Risk   (04/25/2024)   Overall Financial Resource Strain (CARDIA)    Difficulty of Paying Living Expenses: Not hard at all  Food Insecurity: No Food Insecurity (04/25/2024)   Hunger Vital Sign    Worried About Running Out of Food in the Last Year: Never true    Ran Out of Food in the Last Year: Never true  Transportation Needs: No Transportation Needs (04/25/2024)   PRAPARE - Administrator, Civil Service (Medical): No    Lack of Transportation (Non-Medical): No  Physical Activity: Insufficiently Active (04/25/2024)   Exercise Vital Sign    Days of Exercise per Week: 7 days    Minutes of Exercise per  Session: 20 min  Stress: Stress Concern Present (04/25/2024)   [employer] of Occupational Health - Occupational Stress Questionnaire    Feeling of Stress: To some extent  Social Connections: Moderately Integrated (04/25/2024)   Social Connection and Isolation Panel    Frequency of Communication with Friends and Family: More than three times a week    Frequency of Social Gatherings with Friends and Family: More than three times a week    Attends Religious Services: More than 4 times per year    Active Member of [employer] or Organizations: No    Attends Engineer, structural: Never    Marital Status: Married    Tobacco Counseling Counseling given: Yes    Clinical Intake:  Pre-visit preparation completed: Yes  Pain : No/denies pain     BMI - recorded: 26.09 Nutritional Status: BMI 25 -29 Overweight Nutritional Risks: None Diabetes: No  No results found for: HGBA1C   How often do you need to have someone help you when you read instructions, pamphlets, or other written materials from your doctor or pharmacy?: 1 - Never  Interpreter Needed?: No  Information entered by :: Nakeitha Milligan W CMA (AAMA)   Activities of Daily Living     04/25/2024   11:11 AM  In your present state of health, do you have any difficulty performing the following activities:  Hearing? 0   Vision? 0  Difficulty concentrating or making decisions? 0  Walking or climbing stairs? 0  Dressing or bathing? 0  Doing errands, shopping? 0  Preparing Food and eating ? N  Using the Toilet? N  In the past six months, have you accidently leaked urine? N  Do you have problems with loss of bowel control? N  Managing your Medications? N  Managing your Finances? N  Housekeeping or managing your Housekeeping? N    Patient Care Team: Dettinger, Fonda LABOR, MD as PCP - General (Family Medicine) Billee Mliss BIRCH, [facility] (Pharmacist) Christella Marry SAILOR, PA-C (Gastroenterology) Ladora Ross Lacy Phebe, MD as Referring Physician (Optometry)  I have updated your Care Teams any recent Medical Services you may have received from other providers in the past year.     Assessment:   This is a routine wellness examination for [place].  Hearing/Vision screen Hearing Screening - Comments:: Patient denies any hearing difficulties.   Vision Screening - Comments:: Wears rx glasses - up to date with routine eye exams with  [facility] [place] location   Goals Addressed             This Visit's Progress    Exercise 3x per week (30 min per time)   On track    Prevent falls   On track      Depression Screen     04/25/2024   11:12 AM 03/12/2024    1:16 PM 09/13/2023    1:07 PM 04/04/2023    9:52 AM 02/16/2023    8:47 AM 11/01/2022   12:29 PM 09/27/2022    8:53 AM  PHQ 2/9 Scores  PHQ - 2 Score 0 2 2 2  0 3 2  PHQ- 9 Score 0 5 7 7  10 6     Fall Risk     04/25/2024    1:07 PM 03/12/2024    1:16 PM 09/13/2023    1:07 PM 04/04/2023    9:52 AM 02/16/2023    8:46 AM  Fall Risk   Falls in the past year? 0 0 0 0 0  Number  falls in past yr: 0 0   0  Injury with Fall? 0 0   0  Risk for fall due to : No Fall Risks No Fall Risks   No Fall Risks  Follow up Falls evaluation completed;Education provided;Falls prevention discussed Falls evaluation completed   Falls prevention discussed    MEDICARE RISK AT HOME:   Medicare Risk at Home Any stairs in or around the home?: Yes If so, are there any without handrails?: No Home free of loose throw rugs in walkways, pet beds, electrical cords, etc?: Yes Adequate lighting in your home to reduce risk of falls?: Yes Life alert?: No Use of a cane, walker or w/c?: Yes Grab bars in the bathroom?: Yes Shower chair or bench in shower?: No Elevated toilet seat or a handicapped toilet?: No  TIMED UP AND GO:  Was the test performed?  No  Cognitive Function: 6CIT completed        04/25/2024   11:11 AM 02/16/2023    8:49 AM 02/14/2022   10:39 AM 02/11/2020   10:26 AM 01/20/2019    8:39 AM  6CIT Screen  What Year? 0 points 0 points 0 points 0 points 0 points  What month? 0 points 0 points 0 points 0 points 0 points  What time? 0 points 0 points 0 points 0 points 0 points  Count back from 20 0 points 0 points 0 points 0 points 0 points  Months in reverse 0 points 0 points 0 points 0 points 0 points  Repeat phrase 0 points 0 points 0 points 0 points 0 points  Total Score 0 points 0 points 0 points 0 points 0 points    Immunizations Immunization History  Administered Date(s) Administered    sv, Bivalent, Protein Subunit Rsvpref,pf (Abrysvo) 07/05/2023   Fluad Quad(high Dose 65+) 06/03/2019, 06/18/2020, 05/19/2021, 05/09/2022   Fluad Trivalent(High Dose 65+) 06/07/2023   Influenza Split 07/27/2010, 07/20/2011, 09/02/2012, 06/18/2013, 06/23/2015, 05/24/2016   Influenza, High Dose Seasonal PF 06/13/2017, 06/26/2018   Moderna Covid-19 Fall Seasonal Vaccine 27yrs & older 06/07/2023   Moderna Sars-Covid-2 Vaccination 10/18/2019, 11/15/2019, 07/07/2020, 06/22/2021   Pneumococcal Conjugate-13 08/24/2014   Pneumococcal Polysaccharide-23 08/10/2011, 09/19/2019   Tdap 09/17/2006, 02/17/2021   Zoster Recombinant(Shingrix ) 11/25/2021, 04/19/2022    Screening Tests Health Maintenance  Topic Date Due   COVID-19 Vaccine (6 - Moderna risk 2024-25 season) 12/06/2023    INFLUENZA VACCINE  04/04/2024   MAMMOGRAM  07/24/2024   Medicare Annual Wellness (AWV)  04/25/2025   DEXA SCAN  09/12/2025   DTaP/Tdap/Td (3 - Td or Tdap) 02/18/2031   Pneumococcal Vaccine: 50+ Years  Completed   Hepatitis C Screening  Completed   Zoster Vaccines- Shingrix   Completed   HPV VACCINES  Aged Out   Meningococcal B Vaccine  Aged Out   Fecal DNA (Cologuard)  Discontinued    Health Maintenance  Health Maintenance Due  Topic Date Due   COVID-19 Vaccine (6 - Moderna risk 2024-25 season) 12/06/2023   INFLUENZA VACCINE  04/04/2024   Health Maintenance Items Addressed: Mammogram ordered  Additional Screening:  Vision Screening: Recommended annual ophthalmology exams for early detection of glaucoma and other disorders of the eye. Would you like a referral to an eye doctor? No    Dental Screening: Recommended annual dental exams for proper oral hygiene  Community Resource Referral / Chronic Care Management: CRR required this visit?  No   CCM required this visit?  No   Plan:    I have personally reviewed  and noted the following in the patient's chart:   Medical and social history Use of alcohol , tobacco or illicit drugs  Current medications and supplements including opioid prescriptions. Patient is not currently taking opioid prescriptions. Functional ability and status Nutritional status Physical activity Advanced directives List of other physicians Hospitalizations, surgeries, and ER visits in previous 12 months Vitals Screenings to include cognitive, depression, and falls Referrals and appointments  In addition, I have reviewed and discussed with patient certain preventive protocols, quality metrics, and best practice recommendations. A written personalized care plan for preventive services as well as general preventive health recommendations were provided to patient.   Vergia Chea, CMA   04/25/2024   After Visit Summary: (MyChart) Due to this being a  telephonic visit, the after visit summary with patients personalized plan was offered to patient via MyChart   Notes: Nothing significant to report at this time.

## 2024-04-25 NOTE — Patient Instructions (Signed)
 [name] , Thank you for taking time out of your busy schedule to complete your Annual Wellness Visit with me. I enjoyed our conversation and look forward to speaking with you again next year. I, as well as your care team,  appreciate your ongoing commitment to your health goals. Please review the following plan we discussed and let me know if I can assist you in the future.  Your Game plan/ To Do List   Follow up Visits: We will see or speak with you next year for your Next Medicare AWV with our clinical staff   Clinician Recommendations:  Aim for 30 minutes of exercise or brisk walking, 6-8 glasses of water , and 5 servings of fruits and vegetables each day.    Wishing you many blessings and good health during the next year until our next visit.  -Rayn Shorb   This is a list of the screenings recommended for you:  Health Maintenance  Topic Date Due   COVID-19 Vaccine (6 - Moderna risk 2024-25 season) 12/06/2023   Flu Shot  04/04/2024   Mammogram  07/24/2024   Medicare Annual Wellness Visit  04/25/2025   DEXA scan (bone density measurement)  09/12/2025   DTaP/Tdap/Td vaccine (3 - Td or Tdap) 02/18/2031   Pneumococcal Vaccine for age over 46  Completed   Hepatitis C Screening  Completed   Zoster (Shingles) Vaccine  Completed   HPV Vaccine  Aged Out   Meningitis B Vaccine  Aged Out   Cologuard (Stool DNA test)  Discontinued    Advanced directives: (Copy Requested) Please bring a copy of your health care power of attorney and living will to the office to be added to your chart at your convenience. You can mail to [facility] [address] [place], [place] [address] or email to [email or web address] Advance Care Planning is important because it:  [x]  Makes sure you receive the medical care that is consistent with your values, goals, and preferences  [x]  It provides guidance to your family and loved ones and reduces their decisional burden about whether or not they  are making the right decisions based on your wishes.  Follow the link provided in your after visit summary or read over the paperwork we have mailed to you to help you started getting your Advance Directives in place. If you need assistance in completing these, please reach out to us  so that we can help you!  See attachments for Preventive Care and Fall Prevention Tips.

## 2024-06-16 ENCOUNTER — Ambulatory Visit: Payer: Self-pay

## 2024-06-16 NOTE — Telephone Encounter (Signed)
 FYI Only or Action Required?: FYI only for provider.  Patient was last seen in primary care on 03/12/2024 by Dettinger, Fonda LABOR, MD.  Called Nurse Triage reporting Abdominal Pain.  Symptoms began today.  Interventions attempted: Nothing.  Symptoms are: stable.  Triage Disposition: See PCP Within 2 Weeks  Patient/caregiver understands and will follow disposition?: Yes      Copied from CRM [id number]. Topic: Clinical - Red Word Triage >> Jun 16, 2024  3:09 PM Mia F wrote: Red Word that prompted transfer to Nurse Triage: Abdominal pain, diarrhea this morning, and chills. She says she also has constipation as well. She says her stools are very dark. The stomach pain has been going on for a while. She says her stomach feels sore and uncomfortable. Reason for Disposition  Abdominal pain is a chronic symptom (recurrent or ongoing AND present > 4 weeks)  Answer Assessment - Initial Assessment Questions Pt states this has happened before. She felt constipated and took a half a senna and this am had one round of diarrhea.   1. LOCATION: Where does it hurt?      Below belly button 2. RADIATION: Does the pain shoot anywhere else? (e.g., chest, back)     no 3. ONSET: When did the pain begin? (e.g., minutes, hours or days ago)      Started this morning after diarrhea 4. SUDDEN: Gradual or sudden onset?     suddenly 5. PATTERN Does the pain come and go, or is it constant?     constant 6. SEVERITY: How bad is the pain?  (e.g., Scale 1-10; mild, moderate, or severe)     4 7. RECURRENT SYMPTOM: Have you ever had this type of stomach pain before? If Yes, ask: When was the last time? and What happened that time?      Has happened before 8. CAUSE: What do you think is causing the stomach pain? (e.g., gallstones, recent abdominal surgery)     inknown 9. RELIEVING/AGGRAVATING FACTORS: What makes it better or worse? (e.g., antacids, bending or twisting motion, bowel  movement)     no 10. OTHER SYMPTOMS: Do you have any other symptoms? (e.g., back pain, diarrhea, fever, urination pain, vomiting)       Chills but no fever  Protocols used: Abdominal Pain - Female-A-AH

## 2024-06-16 NOTE — Telephone Encounter (Signed)
 Copied from CRM [id number]. Topic: Clinical - Request for Lab/Test Order >> Jun 16, 2024  3:07 PM Mia F wrote: Reason for CRM: Pt is asking if there is a blood test she can take for a colon screening. She says her mother just found out she has colon cancer without any symptoms and she would like to be checked.

## 2024-06-16 NOTE — Telephone Encounter (Signed)
 Called to speak with patient.E2C2 already made appt

## 2024-06-16 NOTE — Telephone Encounter (Signed)
Patient did not pick up.

## 2024-06-17 ENCOUNTER — Ambulatory Visit (INDEPENDENT_AMBULATORY_CARE_PROVIDER_SITE_OTHER): Admitting: Nurse Practitioner

## 2024-06-17 ENCOUNTER — Encounter: Payer: Self-pay | Admitting: Nurse Practitioner

## 2024-06-17 VITALS — BP 123/68 | HR 82 | Temp 97.4°F | Ht 64.0 in | Wt 156.0 lb

## 2024-06-17 DIAGNOSIS — R1024 Suprapubic pain: Secondary | ICD-10-CM

## 2024-06-17 NOTE — Patient Instructions (Signed)
 Diarrhea, Adult Diarrhea is when you pass loose and sometimes watery poop (stool) often. Diarrhea can make you feel weak and cause you to lose water in your body (get dehydrated). Losing water in your body can cause you to: Feel tired and thirsty. Have a dry mouth. Go pee (urinate) less often. Diarrhea often lasts 2-3 days. It can last longer if it is a sign of something more serious. Be sure to treat your diarrhea as told by your doctor. Follow these instructions at home: Eating and drinking     Follow these instructions as told by your doctor: Take an ORS (oral rehydration solution). This is a drink that helps you replace fluids and minerals your body lost. It is sold at pharmacies and stores. Drink enough fluid to keep your pee (urine) pale yellow. Drink fluids such as: Water. You can also get fluids by sucking on ice chips. Diluted fruit juice. Low-calorie sports drinks. Milk. Avoid drinking fluids that have a lot of sugar or caffeine in them. These include soda, energy drinks, and regular sports drinks. Avoid alcohol. Eat bland, easy-to-digest foods in small amounts as you are able. These foods include: Bananas. Applesauce. Rice. Low-fat (lean) meats. Toast. Crackers. Avoid spicy or fatty foods.  Medicines Take over-the-counter and prescription medicines only as told by your doctor. If you were prescribed antibiotics, take them as told by your doctor. Do not stop taking them even if you start to feel better. General instructions  Wash your hands often using soap and water for 20 seconds. If soap and water are not available, use hand sanitizer. Others in your home should wash their hands as well. Wash your hands: After using the toilet or changing a diaper. Before preparing, cooking, or serving food. While caring for a sick person. While visiting someone in a hospital. Rest at home while you get better. Take a warm bath to help with any burning or pain from having  diarrhea. Watch your condition for any changes. Contact a doctor if: You have a fever. Your diarrhea gets worse. You have new symptoms. You vomit every time you eat or drink. You feel light-headed, dizzy, or you have a headache. You have muscle cramps. You have signs of losing too much water in your body, such as: Dark pee, very little pee, or no pee. Cracked lips. Dry mouth. Sunken eyes. Sleepiness. Weakness. You have bloody or black poop or poop that looks like tar. You have very bad pain, cramping, or bloating in your belly (abdomen). Your skin feels cold and clammy. You feel confused. Get help right away if: You have chest pain. Your heart is beating very quickly. You have trouble breathing or you are breathing very quickly. You feel very weak or you faint. These symptoms may be an emergency. Get help right away. Call 911. Do not wait to see if the symptoms will go away. Do not drive yourself to the hospital. This information is not intended to replace advice given to you by your health care provider. Make sure you discuss any questions you have with your health care provider. Document Revised: 02/07/2022 Document Reviewed: 02/07/2022 Elsevier Patient Education  2024 [employer].

## 2024-06-17 NOTE — Progress Notes (Signed)
   Subjective:    Patient ID: Isabella Lindsey, female    DOB: 05-27-1946, 78 y.o.   MRN: [id number]   Chief Complaint: Abdominal Pain (Had some diarrhea yesterday. Her mother was diagnosed with rectal cancer and she is concerned that something may be going on)   Patient  in c/o abdominal pain. This has been going on for over a year. Pain is intermittent along with diarrhea. She does not think this is diet related. Episodes occur about every 2-3 months. Can last a whole day. Had slight diarrhea this morning but other wise is better. * her mom has colon cancer and patient wants colonoscopy.     Patient Active Problem List   Diagnosis Date Noted   Migraine without aura, not intractable, without status migrainosus 02/14/2022   Nonspecific abnormal electrocardiogram (ECG) (EKG) 01/24/2021   GAD (generalized anxiety disorder) 06/26/2018   Hypothyroidism 05/27/2018   Hypertension 05/27/2018   GERD (gastroesophageal reflux disease) 05/27/2018   Hyperlipidemia 05/27/2018      Review of Systems  Constitutional:  Negative for chills and fever.  Gastrointestinal:  Positive for abdominal pain (slight), constipation (occasional) and diarrhea. Negative for nausea and vomiting.  Genitourinary: Negative.   Musculoskeletal:  Back pain: lipase.  Neurological: Negative.   Psychiatric/Behavioral: Negative.         Objective:   Physical Exam Constitutional:      Appearance: Normal appearance. She is obese.  Cardiovascular:     Rate and Rhythm: Normal rate and regular rhythm.     Heart sounds: Normal heart sounds.  Pulmonary:     Effort: Pulmonary effort is normal.     Breath sounds: Normal breath sounds.  Skin:    General: Skin is warm.  Neurological:     General: No focal deficit present.     Mental Status: She is alert and oriented to person, place, and time.  Psychiatric:        Mood and Affect: Mood normal.        Behavior: Behavior normal.    BP 123/68   Pulse 82   Temp (!)  97.4 F (36.3 C) (Temporal)   Ht 5' 4 (1.626 m)   Wt 156 lb (70.8 kg)   SpO2 98%   BMI 26.78 kg/m         Assessment & Plan:   Isabella Lindsey in today with chief complaint of Abdominal Pain (Had some diarrhea yesterday. Her mother was diagnosed with rectal cancer and she is concerned that something may be going on)   1. Suprapubic pain (Primary) Keep diary of diet Labs pending Referral to GI - CBC with Differential/Platelet - CMP14+EGFR - Lipase - Amylase - Ambulatory referral to Gastroenterology    The above assessment and management plan was discussed with the patient. The patient verbalized understanding of and has agreed to the management plan. Patient is aware to call the clinic if symptoms persist or worsen. Patient is aware when to return to the clinic for a follow-up visit. Patient educated on when it is appropriate to go to the emergency department.   Mary-Margaret Gladis, FNP

## 2024-06-18 LAB — CMP14+EGFR
ALT: 19 IU/L (ref 0–32)
AST: 31 IU/L (ref 0–40)
Albumin: 4.3 g/dL (ref 3.8–4.8)
Alkaline Phosphatase: 60 IU/L (ref 49–135)
BUN/Creatinine Ratio: 20 (ref 12–28)
BUN: 28 mg/dL — AB (ref 8–27)
Bilirubin Total: 0.5 mg/dL (ref 0.0–1.2)
CO2: 22 mmol/L (ref 20–29)
Calcium: 9.6 mg/dL (ref 8.7–10.3)
Chloride: 100 mmol/L (ref 96–106)
Creatinine, Ser: 1.4 mg/dL — AB (ref 0.57–1.00)
Globulin, Total: 2.3 g/dL (ref 1.5–4.5)
Glucose: 92 mg/dL (ref 70–99)
Potassium: 3.4 mmol/L — AB (ref 3.5–5.2)
Sodium: 139 mmol/L (ref 134–144)
Total Protein: 6.6 g/dL (ref 6.0–8.5)
eGFR: 39 mL/min/1.73 — AB (ref >59)

## 2024-06-18 LAB — CBC WITH DIFFERENTIAL/PLATELET
Basophils Absolute: 0 x10E3/uL (ref 0.0–0.2)
Basos: 1 %
EOS (ABSOLUTE): 0.1 x10E3/uL (ref 0.0–0.4)
Eos: 1 %
Hematocrit: 39.9 % (ref 34.0–46.6)
Hemoglobin: 13.1 g/dL (ref 11.1–15.9)
Immature Grans (Abs): 0 x10E3/uL (ref 0.0–0.1)
Immature Granulocytes: 0 %
Lymphocytes Absolute: 1.5 x10E3/uL (ref 0.7–3.1)
Lymphs: 26 %
MCH: 30.2 pg (ref 26.6–33.0)
MCHC: 32.8 g/dL (ref 31.5–35.7)
MCV: 92 fL (ref 79–97)
Monocytes Absolute: 0.3 x10E3/uL (ref 0.1–0.9)
Monocytes: 5 %
Neutrophils Absolute: 3.9 x10E3/uL (ref 1.4–7.0)
Neutrophils: 67 %
Platelets: 262 x10E3/uL (ref 150–450)
RBC: 4.34 x10E6/uL (ref 3.77–5.28)
RDW: 13.2 % (ref 11.7–15.4)
WBC: 5.8 x10E3/uL (ref 3.4–10.8)

## 2024-06-18 LAB — AMYLASE: Amylase: 50 U/L (ref 31–110)

## 2024-06-18 LAB — LIPASE: Lipase: 31 U/L (ref 14–85)

## 2024-06-19 ENCOUNTER — Ambulatory Visit: Payer: Self-pay | Admitting: Nurse Practitioner

## 2024-07-09 ENCOUNTER — Telehealth: Payer: Self-pay

## 2024-07-09 DIAGNOSIS — Z1211 Encounter for screening for malignant neoplasm of colon: Secondary | ICD-10-CM

## 2024-07-09 NOTE — Telephone Encounter (Signed)
 Copied from CRM [id number]. Topic: Clinical - Lab/Test Results >> Jul 09, 2024 10:42 AM Isabella Lindsey wrote: Reason for CRM: pt is requesting a cologuard test

## 2024-07-09 NOTE — Telephone Encounter (Signed)
 Cologuard ordered. Pt aware. Last cologuard negative 07/2021

## 2024-07-18 LAB — COLOGUARD

## 2024-07-21 ENCOUNTER — Ambulatory Visit: Payer: Self-pay | Admitting: Family Medicine

## 2024-07-30 ENCOUNTER — Ambulatory Visit
Admission: RE | Admit: 2024-07-30 | Discharge: 2024-07-30 | Disposition: A | Discharge status: Home / Self Care | Source: Ambulatory Visit | Attending: Family Medicine | Admitting: Family Medicine

## 2024-07-30 DIAGNOSIS — Z1231 Encounter for screening mammogram for malignant neoplasm of breast: Secondary | ICD-10-CM

## 2024-08-04 LAB — COLOGUARD

## 2024-08-07 ENCOUNTER — Other Ambulatory Visit: Payer: Self-pay | Admitting: Family Medicine

## 2024-08-07 DIAGNOSIS — R928 Other abnormal and inconclusive findings on diagnostic imaging of breast: Secondary | ICD-10-CM

## 2024-08-13 ENCOUNTER — Other Ambulatory Visit: Payer: Self-pay | Admitting: Family Medicine

## 2024-08-13 ENCOUNTER — Inpatient Hospital Stay: Admission: RE | Admit: 2024-08-13 | Discharge: 2024-08-13 | Attending: Family Medicine

## 2024-08-13 DIAGNOSIS — R928 Other abnormal and inconclusive findings on diagnostic imaging of breast: Secondary | ICD-10-CM

## 2024-08-13 DIAGNOSIS — R921 Mammographic calcification found on diagnostic imaging of breast: Secondary | ICD-10-CM

## 2024-08-14 ENCOUNTER — Ambulatory Visit: Payer: Self-pay | Admitting: Family Medicine

## 2024-08-21 LAB — COLOGUARD: COLOGUARD: NEGATIVE

## 2024-09-01 ENCOUNTER — Ambulatory Visit
Admission: RE | Admit: 2024-09-01 | Discharge: 2024-09-01 | Disposition: A | Source: Ambulatory Visit | Attending: Family Medicine | Admitting: Family Medicine

## 2024-09-01 ENCOUNTER — Ambulatory Visit
Admission: RE | Admit: 2024-09-01 | Discharge: 2024-09-01 | Disposition: A | Source: Ambulatory Visit | Attending: Family Medicine

## 2024-09-01 DIAGNOSIS — R921 Mammographic calcification found on diagnostic imaging of breast: Secondary | ICD-10-CM

## 2024-09-01 DIAGNOSIS — R928 Other abnormal and inconclusive findings on diagnostic imaging of breast: Secondary | ICD-10-CM

## 2024-09-01 HISTORY — PX: BREAST BIOPSY: SHX20

## 2024-09-02 LAB — SURGICAL PATHOLOGY

## 2024-09-08 ENCOUNTER — Encounter: Payer: Self-pay | Admitting: Family Medicine

## 2024-09-08 ENCOUNTER — Ambulatory Visit: Payer: Self-pay | Admitting: Family Medicine

## 2024-09-08 VITALS — BP 136/70 | HR 77 | Ht 64.0 in | Wt 168.0 lb

## 2024-09-08 DIAGNOSIS — N183 Chronic kidney disease, stage 3 unspecified: Secondary | ICD-10-CM | POA: Insufficient documentation

## 2024-09-08 DIAGNOSIS — F411 Generalized anxiety disorder: Secondary | ICD-10-CM | POA: Diagnosis not present

## 2024-09-08 DIAGNOSIS — E039 Hypothyroidism, unspecified: Secondary | ICD-10-CM | POA: Diagnosis not present

## 2024-09-08 DIAGNOSIS — N1832 Chronic kidney disease, stage 3b: Secondary | ICD-10-CM | POA: Diagnosis not present

## 2024-09-08 DIAGNOSIS — I10 Essential (primary) hypertension: Secondary | ICD-10-CM

## 2024-09-08 DIAGNOSIS — E782 Mixed hyperlipidemia: Secondary | ICD-10-CM

## 2024-09-08 MED ORDER — PANTOPRAZOLE SODIUM 40 MG PO TBEC: 40.0000 mg | DELAYED_RELEASE_TABLET | Freq: Two times a day (BID) | ORAL | 3 refills | Status: AC

## 2024-09-08 MED ORDER — LOSARTAN POTASSIUM 25 MG PO TABS: 12.5000 mg | ORAL_TABLET | Freq: Every day | ORAL | 3 refills | Status: AC

## 2024-09-08 MED ORDER — LEVOTHYROXINE SODIUM 88 MCG PO TABS: 88.0000 ug | ORAL_TABLET | Freq: Every day | ORAL | 3 refills | Status: AC

## 2024-09-08 MED ORDER — ESCITALOPRAM OXALATE 20 MG PO TABS: 20.0000 mg | ORAL_TABLET | Freq: Every day | ORAL | 3 refills | Status: AC

## 2024-09-08 MED ORDER — ROSUVASTATIN CALCIUM 10 MG PO TABS: 10.0000 mg | ORAL_TABLET | Freq: Every day | ORAL | 3 refills | Status: AC

## 2024-09-08 MED ORDER — ALPRAZOLAM 0.25 MG PO TABS: 0.2500 mg | ORAL_TABLET | Freq: Every evening | ORAL | 5 refills | Status: AC | PRN

## 2024-09-08 MED ORDER — POTASSIUM CHLORIDE CRYS ER 20 MEQ PO TBCR: 20.0000 meq | EXTENDED_RELEASE_TABLET | Freq: Every day | ORAL | 3 refills | Status: AC

## 2024-09-08 MED ORDER — HYDROCHLOROTHIAZIDE 12.5 MG PO CAPS: 12.5000 mg | ORAL_CAPSULE | Freq: Every day | ORAL | 3 refills | Status: AC

## 2024-09-08 MED ORDER — BUPROPION HCL ER (XL) 150 MG PO TB24: 150.0000 mg | ORAL_TABLET | Freq: Every day | ORAL | 3 refills | Status: AC

## 2024-09-08 NOTE — Progress Notes (Signed)
 "  BP 136/70   Pulse 77   Ht 5' 4 (1.626 m)   Wt 168 lb (76.2 kg)   SpO2 98%   BMI 28.84 kg/m    Subjective:   Patient ID: Isabella Lindsey, female    DOB: 27-Apr-1946, 79 y.o.   MRN: [id number]  HPI: Isabella Lindsey is a 79 y.o. female presenting on 09/08/2024 for Medical Management of Chronic Issues, Hypothyroidism, Hyperlipidemia, and Anxiety   Discussed the use of AI scribe software for clinical note transcription with the patient, who gave verbal consent to proceed.  History of Present Illness   Isabella Lindsey is a 79 year old female with anxiety and hypothyroidism who presents for medication management and follow-up.  Anxiety and psychotropic medication management - Manages anxiety with Lexapro , Wellbutrin , and Xanax  as needed - Stable on current medication regimen - Caregiving responsibilities for her 9 year old mother are demanding and may contribute to stress and fatigue  Fatigue - Consistent fatigue present - Attributes fatigue to caregiving duties and busy lifestyle - Takes levothyroxine  for hypothyroidism  Cognitive function and memory concerns - Experiences memory concerns - Annual memory tests have not shown signs of dementia - Remains actively engaged in cognitively demanding activities such as bookkeeping and computer work - No significant memory decline  Hypothyroidism - Persistent fatigue despite medication  Gastrointestinal and renal symptoms - History of kidney issues - Monitors hydration and diet, focusing on fiber intake - Experiences spasms and abdominal pain, associated with dietary habits and stress levels          Relevant past medical, surgical, family and social history reviewed and updated as indicated. Interim medical history since our last visit reviewed. Allergies and medications reviewed and updated.  Review of Systems  Constitutional:  Negative for chills and fever.  Eyes:  Negative for redness and visual disturbance.   Respiratory:  Negative for chest tightness and shortness of breath.   Cardiovascular:  Negative for chest pain and leg swelling.  Genitourinary:  Negative for difficulty urinating and dysuria.  Skin:  Negative for rash.  Neurological:  Negative for dizziness, light-headedness and headaches.  Psychiatric/Behavioral:  Positive for sleep disturbance. Negative for agitation, behavioral problems, decreased concentration, self-injury and suicidal ideas. The patient is nervous/anxious.   All other systems reviewed and are negative.   Per HPI unless specifically indicated above   Allergies as of 09/08/2024       Reactions   Effexor [venlafaxine]    Dizziness   Penicillins Itching, Swelling   Erythromycin Rash        Medication List        Accurate as of September 08, 2024  1:26 PM. If you have any questions, ask your nurse or doctor.          ALPRAZolam  0.25 MG tablet Commonly known as: XANAX  Take 1 tablet (0.25 mg total) by mouth at bedtime as needed for anxiety.   buPROPion  150 MG 24 hr tablet Commonly known as: WELLBUTRIN  XL Take 1 tablet (150 mg total) by mouth daily.   cholecalciferol 25 MCG (1000 UNIT) tablet Commonly known as: VITAMIN D3 Take 2,000 Units by mouth daily.   escitalopram  20 MG tablet Commonly known as: LEXAPRO  Take 1 tablet (20 mg total) by mouth daily.   fluticasone  50 MCG/ACT nasal spray Commonly known as: FLONASE  Place 2 sprays into both nostrils daily.   hydrochlorothiazide  12.5 MG capsule Commonly known as: MICROZIDE  Take 1 capsule (12.5 mg total) by mouth daily.  Iron  (Ferrous Sulfate ) 325 (65 Fe) MG Tabs Take 325 mg by mouth daily.   levothyroxine  88 MCG tablet Commonly known as: SYNTHROID  Take 1 tablet (88 mcg total) by mouth daily.   losartan  25 MG tablet Commonly known as: COZAAR  Take 0.5 tablets (12.5 mg total) by mouth daily.   nitrofurantoin  (macrocrystal-monohydrate) 100 MG capsule Commonly known as: Macrobid  Take 1  capsule (100 mg total) by mouth 2 (two) times daily. 1 po BId   pantoprazole  40 MG tablet Commonly known as: PROTONIX  Take 1 tablet (40 mg total) by mouth 2 (two) times daily before a meal.   potassium chloride  SA 20 MEQ tablet Commonly known as: KLOR-CON  M Take 1 tablet (20 mEq total) by mouth daily.   rosuvastatin  10 MG tablet Commonly known as: CRESTOR  Take 1 tablet (10 mg total) by mouth daily.         Objective:   BP 136/70   Pulse 77   Ht 5' 4 (1.626 m)   Wt 168 lb (76.2 kg)   SpO2 98%   BMI 28.84 kg/m   Wt Readings from Last 3 Encounters:  09/08/24 168 lb (76.2 kg)  06/17/24 156 lb (70.8 kg)  04/25/24 152 lb (68.9 kg)    Physical Exam Physical Exam   NECK: Thyroid  without nodules. CHEST: Lungs clear to auscultation bilaterally. CARDIOVASCULAR: Heart regular rate and rhythm, no murmurs. ABDOMEN: No costovertebral angle tenderness. EXTREMITIES: Mild swelling in lower extremities. Peripheral pulses intact.         Assessment & Plan:   Problem List Items Addressed This Visit       Cardiovascular and Mediastinum   Hypertension   Relevant Medications   hydrochlorothiazide  (MICROZIDE ) 12.5 MG capsule   losartan  (COZAAR ) 25 MG tablet   rosuvastatin  (CRESTOR ) 10 MG tablet   Other Relevant Orders   CMP14+EGFR     Endocrine   Hypothyroidism - Primary   Relevant Medications   levothyroxine  (SYNTHROID ) 88 MCG tablet   Other Relevant Orders   TSH     Genitourinary   CKD (chronic kidney disease) stage 3, GFR 30-59 ml/min (HCC)   Relevant Orders   CMP14+EGFR     Other   Hyperlipidemia   Relevant Medications   hydrochlorothiazide  (MICROZIDE ) 12.5 MG capsule   losartan  (COZAAR ) 25 MG tablet   rosuvastatin  (CRESTOR ) 10 MG tablet   Other Relevant Orders   CMP14+EGFR   Lipid panel   GAD (generalized anxiety disorder)   Relevant Medications   ALPRAZolam  (XANAX ) 0.25 MG tablet   buPROPion  (WELLBUTRIN  XL) 150 MG 24 hr tablet   escitalopram  (LEXAPRO )  20 MG tablet   Other Relevant Orders   CBC with Differential/Platelet   CMP14+EGFR   Lipid panel   ToxASSURE Select 13 (MW), Urine        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  - Rechecked kidney function today.  Acquired hypothyroidism Managed with levothyroxine . Fatigue possibly related to caregiving and activity level. - Continue current levothyroxine  regimen.  Generalized anxiety disorder Managed with Lexapro , Wellbutrin , and Xanax  as needed.  - Continue current medications: Lexapro , Wellbutrin , and Xanax  as needed.   Controlled substance agreement and urine tox assure was done today. PDMP was evaluated and patient is consistent on her refills.      Follow up plan: Return in about 6 months (around 03/08/2025), or if symptoms worsen or fail to improve, for Physical exam and recheck hypertension and hyperlipidemia and thyroid  and things that.  Counseling provided for  all of the vaccine components Orders Placed This Encounter  Procedures   CBC with Differential/Platelet   CMP14+EGFR   Lipid panel   TSH   ToxASSURE Select 13 (MW), Urine    Fonda Levins, MD [facility] 09/08/2024, 1:26 PM     "

## 2024-09-09 LAB — CMP14+EGFR
ALT: 15 IU/L (ref 0–32)
AST: 20 IU/L (ref 0–40)
Albumin: 4.2 g/dL (ref 3.8–4.8)
Alkaline Phosphatase: 67 IU/L (ref 49–135)
BUN/Creatinine Ratio: 20 (ref 12–28)
BUN: 25 mg/dL (ref 8–27)
Bilirubin Total: 0.4 mg/dL (ref 0.0–1.2)
CO2: 24 mmol/L (ref 20–29)
Calcium: 9.6 mg/dL (ref 8.7–10.3)
Chloride: 101 mmol/L (ref 96–106)
Creatinine, Ser: 1.27 mg/dL — ABNORMAL HIGH (ref 0.57–1.00)
Globulin, Total: 2.6 g/dL (ref 1.5–4.5)
Glucose: 83 mg/dL (ref 70–99)
Potassium: 3.7 mmol/L (ref 3.5–5.2)
Sodium: 137 mmol/L (ref 134–144)
Total Protein: 6.8 g/dL (ref 6.0–8.5)
eGFR: 43 mL/min/1.73 — ABNORMAL LOW

## 2024-09-09 LAB — CBC WITH DIFFERENTIAL/PLATELET
Basophils Absolute: 0.1 x10E3/uL (ref 0.0–0.2)
Basos: 1 %
EOS (ABSOLUTE): 0.2 x10E3/uL (ref 0.0–0.4)
Eos: 2 %
Hematocrit: 40.9 % (ref 34.0–46.6)
Hemoglobin: 12.9 g/dL (ref 11.1–15.9)
Immature Grans (Abs): 0 x10E3/uL (ref 0.0–0.1)
Immature Granulocytes: 0 %
Lymphocytes Absolute: 2.5 x10E3/uL (ref 0.7–3.1)
Lymphs: 31 %
MCH: 29.3 pg (ref 26.6–33.0)
MCHC: 31.5 g/dL (ref 31.5–35.7)
MCV: 93 fL (ref 79–97)
Monocytes Absolute: 0.6 x10E3/uL (ref 0.1–0.9)
Monocytes: 8 %
Neutrophils Absolute: 4.9 x10E3/uL (ref 1.4–7.0)
Neutrophils: 58 %
Platelets: 270 x10E3/uL (ref 150–450)
RBC: 4.4 x10E6/uL (ref 3.77–5.28)
RDW: 12.5 % (ref 11.7–15.4)
WBC: 8.3 x10E3/uL (ref 3.4–10.8)

## 2024-09-09 LAB — LIPID PANEL
Chol/HDL Ratio: 2.3 ratio (ref 0.0–4.4)
Cholesterol, Total: 156 mg/dL (ref 100–199)
HDL: 67 mg/dL
LDL Chol Calc (NIH): 62 mg/dL (ref 0–99)
Triglycerides: 161 mg/dL — ABNORMAL HIGH (ref 0–149)
VLDL Cholesterol Cal: 27 mg/dL (ref 5–40)

## 2024-09-09 LAB — TSH: TSH: 2.16 u[IU]/mL (ref 0.450–4.500)

## 2024-09-10 LAB — TOXASSURE SELECT 13 (MW), URINE

## 2024-09-15 ENCOUNTER — Ambulatory Visit: Payer: Self-pay | Admitting: Family Medicine

## 2025-03-09 ENCOUNTER — Encounter: Admitting: Family Medicine

## 2025-04-28 ENCOUNTER — Ambulatory Visit: Payer: Self-pay
